# Patient Record
Sex: Female | Born: 1990 | Race: Black or African American | Hispanic: No | Marital: Single | Smoking: Light tobacco smoker
Health system: Southern US, Community
[De-identification: ages and names within clinical notes are randomized; demographics above are authoritative.]

## PROBLEM LIST (undated history)

## (undated) ENCOUNTER — Observation Stay: Admission: AD | Payer: No Typology Code available for payment source | Source: Ambulatory Visit | Admitting: Specialist

## (undated) ENCOUNTER — Observation Stay
Admission: RE | Payer: No Typology Code available for payment source | Source: Ambulatory Visit | Admitting: Obstetrics & Gynecology

## (undated) DIAGNOSIS — Z789 Other specified health status: Secondary | ICD-10-CM

## (undated) DIAGNOSIS — O24419 Gestational diabetes mellitus in pregnancy, unspecified control: Secondary | ICD-10-CM

## (undated) DIAGNOSIS — D649 Anemia, unspecified: Secondary | ICD-10-CM

## (undated) DIAGNOSIS — O1490 Unspecified pre-eclampsia, unspecified trimester: Secondary | ICD-10-CM

## (undated) DIAGNOSIS — R519 Headache, unspecified: Secondary | ICD-10-CM

## (undated) HISTORY — DX: Headache, unspecified: R51.9

## (undated) HISTORY — PX: ECTOPIC PREGNANCY SURGERY: SHX613

## (undated) HISTORY — DX: Unspecified pre-eclampsia, unspecified trimester: O14.90

---
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
## 2008-05-03 ENCOUNTER — Emergency Department: Admit: 2008-05-03 | Payer: Self-pay | Source: Emergency Department | Admitting: Emergency Medicine

## 2008-05-03 LAB — COMPREHENSIVE METABOLIC PANEL - AH CERNER
ALT: 9 U/L (ref 0–31)
AST (SGOT): 17 U/L (ref 0–31)
Albumin/Globulin Ratio: 1.7 (ref 1.1–2.2)
Albumin: 4 g/dL (ref 3.4–4.8)
Alkaline Phosphatase: 65 U/L (ref 35–104)
Anion Gap: 8 mEq/L (ref 5–15)
BUN: 11 mg/dL (ref 6–20)
Bilirubin, Total: 0.4 mg/dL (ref 0.0–1.0)
CA: 4.3 mEq/L (ref 3.8–4.6)
CO2: 25.8 mEq/L (ref 22.0–29.0)
Calcium: 9.3 mg/dL (ref 8.4–10.2)
Chloride: 107 mEq/L (ref 96–108)
Creatinine: 0.9 mg/dL (ref 0.4–1.1)
Globulin: 2.4 g/dL (ref 2.0–3.6)
Glucose: 84 mg/dL (ref 70–100)
Osmolality Calculated: 291 mosm/kg (ref 282–298)
Potassium: 3.6 mEq/L (ref 3.3–5.1)
Protein, Total: 6.4 g/dL (ref 6.4–8.3)
Sodium: 141 mEq/L (ref 133–145)
UN/CREA SOFT: 12 RATIO (ref 6–33)

## 2008-05-03 LAB — CBC AND DIFFERENTIAL
Basophils Absolute: 0 /mm3 (ref 0.0–0.2)
Basophils: 0 % (ref 0–2)
Eosinophils Absolute: 0 /mm3 (ref 0.0–0.7)
Eosinophils: 1 % (ref 0–5)
Granulocytes Absolute: 1.6 /mm3 — ABNORMAL LOW (ref 1.8–8.1)
Hematocrit: 35.5 % — ABNORMAL LOW (ref 37.0–47.0)
Hgb: 12.1 G/DL (ref 12.0–16.0)
Immature Granulocytes Absolute: 0 CUMM (ref 0.0–0.0)
Immature Granulocytes: 0 % (ref 0–1)
Lymphocytes Absolute: 2.1 /mm3 (ref 0.5–4.4)
Lymphocytes: 49 % — ABNORMAL HIGH (ref 15–41)
MCH: 30.7 PG (ref 28.0–32.0)
MCHC: 34.1 G/DL (ref 32.0–36.0)
MCV: 90.1 FL (ref 80.0–100.0)
MPV: 8.9 FL — ABNORMAL LOW (ref 9.4–12.3)
Monocytes Absolute: 0.5 /mm3 (ref 0.0–1.2)
Monocytes: 11 % (ref 0–11)
Neutrophils %: 39 % — ABNORMAL LOW (ref 52–75)
Platelets: 320 /mm3 (ref 140–400)
RBC: 3.94 /mm3 — ABNORMAL LOW (ref 4.20–5.40)
RDW: 12.1 % (ref 11.5–15.0)
WBC: 4.22 /mm3 (ref 3.50–10.80)

## 2008-05-03 LAB — CREATINE KINASE W/O REFLEX (SOFT): Creatine Kinase (CK): 112 U/L (ref 24–170)

## 2008-05-03 LAB — PT AND APTT
PT INR: 1 {INR} (ref 0.9–1.1)
PT: 12.4 s (ref 10.8–13.3)
PTT: 30 s (ref 21–32)

## 2008-05-03 LAB — HEMOLYSIS INDEX: Hemolysis Index: 19 Units

## 2008-05-03 LAB — D-DIMER (DVT) CERNER: D-Dimer (DVT): <100 ng mL (ref 0–600)

## 2008-05-03 LAB — TROPONIN I: Troponin I: 0 ng/mL — AB

## 2008-06-06 ENCOUNTER — Emergency Department: Admit: 2008-06-06 | Payer: Self-pay | Source: Emergency Department | Admitting: Emergency Medicine

## 2008-07-06 ENCOUNTER — Emergency Department: Admit: 2008-07-06 | Payer: Self-pay | Source: Emergency Department | Admitting: Emergency Medicine

## 2008-10-19 ENCOUNTER — Emergency Department: Admit: 2008-10-19 | Payer: Self-pay | Source: Emergency Department | Admitting: Emergency Medicine

## 2008-10-19 LAB — CBC AND DIFFERENTIAL
Basophils Absolute: 0 /mm3 (ref 0.0–0.2)
Basophils: 0 % (ref 0–2)
Eosinophils Absolute: 0.1 /mm3 (ref 0.0–0.7)
Eosinophils: 1 % (ref 0–5)
Granulocytes Absolute: 3.2 /mm3 (ref 1.8–8.1)
Hematocrit: 39.3 % (ref 37.0–47.0)
Hgb: 13 G/DL (ref 12.0–16.0)
Immature Granulocytes Absolute: 0 CUMM (ref 0.0–0.0)
Immature Granulocytes: 0 % (ref 0–1)
Lymphocytes Absolute: 1.7 /mm3 (ref 0.5–4.4)
Lymphocytes: 33 % (ref 15–41)
MCH: 30.5 PG (ref 28.0–32.0)
MCHC: 33.1 G/DL (ref 32.0–36.0)
MCV: 92.3 FL (ref 80.0–100.0)
MPV: 8.7 FL — ABNORMAL LOW (ref 9.4–12.3)
Monocytes Absolute: 0.3 /mm3 (ref 0.0–1.2)
Monocytes: 6 % (ref 0–11)
Neutrophils %: 60 % (ref 52–75)
Platelets: 370 /mm3 (ref 140–400)
RBC: 4.26 /mm3 (ref 4.20–5.40)
RDW: 12.5 % (ref 11.5–15.0)
WBC: 5.27 /mm3 (ref 3.50–10.80)

## 2008-10-19 LAB — COMPREHENSIVE METABOLIC PANEL - AH CERNER
ALT: 17 U/L (ref 0–31)
AST (SGOT): 19 U/L (ref 0–31)
Albumin/Globulin Ratio: 1.4 (ref 1.1–2.2)
Albumin: 4.1 g/dL (ref 3.4–4.8)
Alkaline Phosphatase: 82 U/L (ref 35–104)
Anion Gap: 6 mEq/L (ref 5–15)
BUN: 9 mg/dL (ref 6–20)
Bilirubin, Total: 0.3 mg/dL (ref 0.0–1.0)
CA: 3.9 mEq/L (ref 3.8–4.6)
CO2: 30.6 mEq/L — ABNORMAL HIGH (ref 22.0–29.0)
Calcium: 8.8 mg/dL (ref 8.4–10.2)
Chloride: 105 mEq/L (ref 96–108)
Creatinine: 0.6 mg/dL (ref 0.4–1.1)
Globulin: 2.9 g/dL (ref 2.0–3.6)
Glucose: 82 mg/dL (ref 70–100)
Osmolality Calculated: 292 mosm/kg (ref 282–298)
Potassium: 3.9 mEq/L (ref 3.3–5.1)
Protein, Total: 7 g/dL (ref 6.4–8.3)
Sodium: 142 mEq/L (ref 133–145)
UN/CREA SOFT: 15 RATIO (ref 6–33)

## 2008-10-19 LAB — URINALYSIS WITH MICROSCOPIC
Bilirubin, UA: NEGATIVE
Blood, UA: NEGATIVE
Glucose, UA: NEGATIVE
Ketones UA: NEGATIVE
Leukocyte Esterase, UA: NEGATIVE
Nitrite, UA: NEGATIVE
Protein, UR: NEGATIVE
RBC, UA: 1 /HPF (ref 0–3)
Specific Gravity UA POCT: 1.025 (ref 1.005–1.030)
Squamous Epithelial Cells, Urine: 6 /LPF — ABNORMAL HIGH (ref 0–0)
Urine pH: 6 (ref 4.6–8.0)
Urobilinogen, UA: NORMAL EU/dL
WBC, UA: 2 /HPF (ref 0–5)

## 2008-10-19 LAB — AMYLASE: Amylase: 75 U/L (ref 20–148)

## 2008-10-19 LAB — HEMOLYSIS INDEX: Hemolysis Index: 7 Units

## 2008-10-19 LAB — LIPASE: Lipase: 23 U/L (ref 13–60)

## 2009-01-07 ENCOUNTER — Emergency Department: Admit: 2009-01-07 | Payer: Self-pay | Source: Emergency Department | Admitting: Emergency Medicine

## 2009-01-07 LAB — CBC AND DIFFERENTIAL
Basophils Absolute: 0 /mm3 (ref 0.0–0.2)
Basophils: 0 % (ref 0–2)
Eosinophils Absolute: 0 /mm3 (ref 0.0–0.7)
Eosinophils: 1 % (ref 0–5)
Granulocytes Absolute: 3.7 /mm3 (ref 1.8–8.1)
Hematocrit: 35.9 % — ABNORMAL LOW (ref 37.0–47.0)
Hgb: 12.7 G/DL (ref 12.0–16.0)
Immature Granulocytes Absolute: 0 CUMM (ref 0.0–0.0)
Immature Granulocytes: 0 % (ref 0–1)
Lymphocytes Absolute: 1.5 /mm3 (ref 0.5–4.4)
Lymphocytes: 26 % (ref 15–41)
MCH: 31.4 PG (ref 28.0–32.0)
MCHC: 35.4 G/DL (ref 32.0–36.0)
MCV: 88.9 FL (ref 80.0–100.0)
MPV: 8.6 FL — ABNORMAL LOW (ref 9.4–12.3)
Monocytes Absolute: 0.6 /mm3 (ref 0.0–1.2)
Monocytes: 10 % (ref 0–11)
Neutrophils %: 63 % (ref 52–75)
Platelets: 332 /mm3 (ref 140–400)
RBC: 4.04 /mm3 — ABNORMAL LOW (ref 4.20–5.40)
RDW: 12.3 % (ref 11.5–15.0)
WBC: 5.8 /mm3 (ref 3.50–10.80)

## 2009-01-07 LAB — RH IMMUNE GLOBULIN WORKUP CERNER
AB Screen Gel: NEGATIVE
ABO Rh: O NEG

## 2009-01-07 LAB — URINALYSIS WITH MICROSCOPIC
Bilirubin, UA: NEGATIVE
Glucose, UA: NEGATIVE
Ketones UA: NEGATIVE
Nitrite, UA: NEGATIVE
Protein, UR: NEGATIVE
RBC, UA: 1 /HPF (ref 0–3)
Specific Gravity UA POCT: 1.01 (ref 1.005–1.030)
Squamous Epithelial Cells, Urine: 2 /HPF — ABNORMAL HIGH (ref 0–0)
Urine pH: 6 (ref 4.6–8.0)
Urobilinogen, UA: NORMAL mg/dL
WBC, UA: 6 /HPF — ABNORMAL HIGH (ref 0–5)

## 2009-01-07 LAB — PT AND APTT
PT INR: 1 {INR} (ref 0.9–1.1)
PT: 12.2 s (ref 10.8–13.3)
PTT: 25 s (ref 21–32)

## 2009-01-07 LAB — HCG QUANTITATIVE: hCG, Quant.: 1496 m[IU]/mL — ABNORMAL HIGH (ref 0–5)

## 2009-01-14 ENCOUNTER — Inpatient Hospital Stay
Admission: EM | Admit: 2009-01-14 | Disposition: A | Discharge status: Home / Self Care | Payer: Self-pay | Source: Emergency Department | Admitting: Obstetrics & Gynecology

## 2009-01-14 LAB — COMPREHENSIVE METABOLIC PANEL
ALT: 43 U/L (ref 0–55)
AST (SGOT): 27 U/L (ref 5–34)
Albumin/Globulin Ratio: 1.2 (ref 0.9–2.2)
Albumin: 3.4 g/dL — ABNORMAL LOW (ref 3.5–5.0)
Alkaline Phosphatase: 70 U/L (ref 40–150)
BUN: 10 mg/dL (ref 7–19)
Bilirubin, Total: 0.2 mg/dL (ref 0.2–1.2)
CO2: 24 mEq/L (ref 22–29)
Calcium: 9.1 mg/dL (ref 8.9–10.0)
Chloride: 103 mEq/L (ref 96–107)
Creatinine: 0.7 mg/dL (ref 0.6–1.0)
Globulin: 2.9 g/dL (ref 2.0–3.6)
Glucose: 73 mg/dL (ref 70–110)
Potassium: 3.7 mEq/L (ref 3.5–5.1)
Protein, Total: 6.3 g/dL (ref 6.0–8.3)
Sodium: 136 mEq/L (ref 136–145)

## 2009-01-14 LAB — CBC AND DIFFERENTIAL
Basophils Absolute: 0 /mm3 (ref 0.0–0.2)
Basophils: 0 % (ref 0–2)
Eosinophils Absolute: 0.1 /mm3 (ref 0.0–0.7)
Eosinophils: 1 % (ref 0–5)
Granulocytes Absolute: 3.2 /mm3 (ref 1.8–8.1)
Hematocrit: 33.4 % — ABNORMAL LOW (ref 37.0–47.0)
Hgb: 11.6 G/DL — ABNORMAL LOW (ref 12.0–16.0)
Immature Granulocytes Absolute: 0 CUMM (ref 0.0–0.0)
Immature Granulocytes: 0 % (ref 0–1)
Lymphocytes Absolute: 1.8 /mm3 (ref 0.5–4.4)
Lymphocytes: 33 % (ref 15–41)
MCH: 30.5 PG (ref 28.0–32.0)
MCHC: 34.7 G/DL (ref 32.0–36.0)
MCV: 87.9 FL (ref 80.0–100.0)
MPV: 8.7 FL — ABNORMAL LOW (ref 9.4–12.3)
Monocytes Absolute: 0.4 /mm3 (ref 0.0–1.2)
Monocytes: 8 % (ref 0–11)
Neutrophils %: 58 % (ref 52–75)
Platelets: 312 /mm3 (ref 140–400)
RBC: 3.8 /mm3 — ABNORMAL LOW (ref 4.20–5.40)
RDW: 12.2 % (ref 11.5–15.0)
WBC: 5.48 /mm3 (ref 3.50–10.80)

## 2009-01-14 LAB — TYPE AND SCREEN
AB Screen Gel: POSITIVE
ABO Rh: O NEG

## 2009-01-14 LAB — GFR

## 2009-01-14 LAB — ANTIBODY IDENTIFICATION - REFLEX

## 2009-01-14 LAB — HEMOLYSIS INDEX: Hemolysis Index: 4 Units

## 2009-01-14 LAB — HCG QUANTITATIVE: hCG, Quant.: 3699 m[IU]/mL — ABNORMAL HIGH (ref 0–5)

## 2009-01-15 LAB — CBC
Hematocrit: 32.7 % — ABNORMAL LOW (ref 37.0–47.0)
Hgb: 11.4 G/DL — ABNORMAL LOW (ref 12.0–16.0)
MCH: 30.8 PG (ref 28.0–32.0)
MCHC: 34.9 G/DL (ref 32.0–36.0)
MCV: 88.4 FL (ref 80.0–100.0)
MPV: 9.2 FL — ABNORMAL LOW (ref 9.4–12.3)
Platelets: 327 /mm3 (ref 140–400)
RBC: 3.7 /mm3 — ABNORMAL LOW (ref 4.20–5.40)
RDW: 12.3 % (ref 11.5–15.0)
WBC: 10.46 /mm3 (ref 3.50–10.80)

## 2009-02-18 ENCOUNTER — Emergency Department: Admit: 2009-02-18 | Payer: Self-pay | Source: Emergency Department | Admitting: Pediatric Emergency Medicine

## 2009-02-18 LAB — URINALYSIS WITH MICROSCOPIC
Bilirubin, UA: NEGATIVE
Blood, UA: NEGATIVE
Glucose, UA: NEGATIVE
Ketones UA: NEGATIVE
Nitrite, UA: NEGATIVE
Protein, UR: NEGATIVE
RBC, UA: 3 /HPF (ref 0–3)
Specific Gravity UA POCT: 1.01 (ref 1.005–1.030)
Squamous Epithelial Cells, Urine: 6 /HPF — ABNORMAL HIGH (ref 0–0)
Urine pH: 7 (ref 4.6–8.0)
Urobilinogen, UA: 2 mg/dL — ABNORMAL HIGH (ref 0.2–1.0)
WBC, UA: 5 /HPF (ref 0–5)

## 2009-03-16 ENCOUNTER — Emergency Department: Admit: 2009-03-16 | Payer: Self-pay | Source: Emergency Department | Admitting: Emergency Medicine

## 2009-03-16 LAB — CBC AND DIFFERENTIAL
Basophils Absolute: 0 /mm3 (ref 0.0–0.2)
Basophils: 0 % (ref 0–2)
Eosinophils Absolute: 0.1 /mm3 (ref 0.0–0.7)
Eosinophils: 1 % (ref 0–5)
Granulocytes Absolute: 1.9 /mm3 (ref 1.8–8.1)
Hematocrit: 34.6 % — ABNORMAL LOW (ref 37.0–47.0)
Hgb: 12.2 G/DL (ref 12.0–16.0)
Immature Granulocytes Absolute: 0 CUMM (ref 0.0–0.0)
Immature Granulocytes: 0 % (ref 0–1)
Lymphocytes Absolute: 2.6 /mm3 (ref 0.5–4.4)
Lymphocytes: 52 % — ABNORMAL HIGH (ref 15–41)
MCH: 30.8 PG (ref 28.0–32.0)
MCHC: 35.3 G/DL (ref 32.0–36.0)
MCV: 87.4 FL (ref 80.0–100.0)
MPV: 8.9 FL — ABNORMAL LOW (ref 9.4–12.3)
Monocytes Absolute: 0.4 /mm3 (ref 0.0–1.2)
Monocytes: 9 % (ref 0–11)
Neutrophils %: 38 % — ABNORMAL LOW (ref 52–75)
Platelets: 294 /mm3 (ref 140–400)
RBC: 3.96 /mm3 — ABNORMAL LOW (ref 4.20–5.40)
RDW: 11.9 % (ref 11.5–15.0)
WBC: 4.94 /mm3 (ref 3.50–10.80)

## 2009-03-16 LAB — CREATININE WHOLE BLOOD: Whole Blood Creatinine: 0.86 mg/dL (ref 0.40–1.10)

## 2009-03-16 LAB — URINALYSIS WITH MICROSCOPIC
Bilirubin, UA: NEGATIVE
Blood, UA: NEGATIVE
Glucose, UA: NEGATIVE
Ketones UA: NEGATIVE
Nitrite, UA: NEGATIVE
Protein, UR: NEGATIVE
RBC, UA: 2 /HPF (ref 0–3)
Specific Gravity UA POCT: 1.005 (ref 1.005–1.030)
Squamous Epithelial Cells, Urine: 3 /HPF — ABNORMAL HIGH (ref 0–0)
Urine pH: 8 (ref 4.6–8.0)
Urobilinogen, UA: NORMAL mg/dL
WBC, UA: 14 /HPF — ABNORMAL HIGH (ref 0–5)

## 2009-03-16 LAB — COMPREHENSIVE METABOLIC PANEL
ALT: 9 U/L (ref 0–55)
AST (SGOT): 15 U/L (ref 5–34)
Albumin/Globulin Ratio: 1.1 (ref 0.9–2.2)
Albumin: 3.5 g/dL (ref 3.5–5.0)
Alkaline Phosphatase: 74 U/L (ref 40–150)
BUN: 14 mg/dL (ref 7–19)
Bilirubin, Total: 0.1 mg/dL — AB (ref 0.2–1.2)
CO2: 25 mEq/L (ref 22–29)
Calcium: 8.9 mg/dL (ref 8.9–10.0)
Chloride: 106 mEq/L (ref 96–107)
Creatinine: 0.8 mg/dL (ref 0.6–1.0)
Globulin: 3.2 g/dL (ref 2.0–3.6)
Glucose: 96 mg/dL (ref 70–110)
Potassium: 3.9 mEq/L (ref 3.5–5.1)
Protein, Total: 6.7 g/dL (ref 6.0–8.3)
Sodium: 139 mEq/L (ref 136–145)

## 2009-03-16 LAB — GFR

## 2009-03-16 LAB — HEMOLYSIS INDEX: Hemolysis Index: 14 Units

## 2009-04-23 ENCOUNTER — Emergency Department: Admit: 2009-04-23 | Payer: Self-pay | Source: Emergency Department | Admitting: Emergency Medicine

## 2009-05-15 ENCOUNTER — Emergency Department: Admit: 2009-05-15 | Payer: Self-pay | Source: Emergency Department | Admitting: Emergency Medicine

## 2009-05-15 LAB — COMPREHENSIVE METABOLIC PANEL
ALT: 9 U/L (ref 0–55)
AST (SGOT): 14 U/L (ref 5–34)
Albumin/Globulin Ratio: 1.1 (ref 0.9–2.2)
Albumin: 3.7 g/dL (ref 3.5–5.0)
Alkaline Phosphatase: 75 U/L (ref 40–150)
BUN: 10 mg/dL (ref 7–19)
Bilirubin, Total: 0.1 mg/dL — AB (ref 0.2–1.2)
CO2: 25 mEq/L (ref 22–29)
Calcium: 9 mg/dL (ref 8.9–10.0)
Chloride: 106 mEq/L (ref 96–107)
Creatinine: 0.8 mg/dL (ref 0.6–1.0)
Globulin: 3.4 g/dL (ref 2.0–3.6)
Glucose: 99 mg/dL (ref 70–110)
Potassium: 4 mEq/L (ref 3.5–5.1)
Protein, Total: 7.1 g/dL (ref 6.0–8.3)
Sodium: 139 mEq/L (ref 136–145)

## 2009-05-15 LAB — LIPASE: Lipase: 17 U/L (ref 8–78)

## 2009-05-15 LAB — CBC AND DIFFERENTIAL
Basophils Absolute: 0 /mm3 (ref 0.0–0.2)
Basophils: 0 % (ref 0–2)
Eosinophils Absolute: 0.1 /mm3 (ref 0.0–0.7)
Eosinophils: 1 % (ref 0–5)
Granulocytes Absolute: 2.8 /mm3 (ref 1.8–8.1)
Hematocrit: 37.3 % (ref 37.0–47.0)
Hgb: 13 G/DL (ref 12.0–16.0)
Immature Granulocytes Absolute: 0 CUMM (ref 0.0–0.0)
Immature Granulocytes: 0 % (ref 0–1)
Lymphocytes Absolute: 2.7 /mm3 (ref 0.5–4.4)
Lymphocytes: 44 % — ABNORMAL HIGH (ref 15–41)
MCH: 30.4 PG (ref 28.0–32.0)
MCHC: 34.9 G/DL (ref 32.0–36.0)
MCV: 87.4 FL (ref 80.0–100.0)
MPV: 8.8 FL — ABNORMAL LOW (ref 9.4–12.3)
Monocytes Absolute: 0.5 /mm3 (ref 0.0–1.2)
Monocytes: 8 % (ref 0–11)
Neutrophils %: 47 % — ABNORMAL LOW (ref 52–75)
Platelets: 384 /mm3 (ref 140–400)
RBC: 4.27 /mm3 (ref 4.20–5.40)
RDW: 12.5 % (ref 11.5–15.0)
WBC: 6.02 /mm3 (ref 3.50–10.80)

## 2009-05-15 LAB — GFR

## 2009-05-15 LAB — HEMOLYSIS INDEX: Hemolysis Index: 8 Units

## 2009-07-11 ENCOUNTER — Emergency Department: Admit: 2009-07-11 | Payer: Self-pay | Source: Emergency Department | Admitting: Internal Medicine

## 2009-08-31 ENCOUNTER — Emergency Department: Admit: 2009-08-31 | Payer: Self-pay | Source: Emergency Department | Admitting: Emergency Medicine

## 2009-08-31 LAB — URINALYSIS WITH MICROSCOPIC
Bilirubin, UA: NEGATIVE
Blood, UA: NEGATIVE
Glucose, UA: NEGATIVE
Ketones UA: NEGATIVE
Leukocyte Esterase, UA: NEGATIVE
Nitrite, UA: NEGATIVE
Protein, UR: NEGATIVE
RBC, UA: <=1 /HPF (ref 0–?)
Specific Gravity UA POCT: 1.01 (ref 1.005–1.030)
Squamous Epithelial Cells, Urine: 3 /HPF — ABNORMAL HIGH (ref 0–0)
Urine pH: 7 (ref 4.6–8.0)
Urobilinogen, UA: NORMAL mg/dL
WBC, UA: 3 /HPF (ref 0–5)

## 2009-08-31 LAB — CBC AND DIFFERENTIAL
Basophils Absolute: 0 /mm3 (ref 0.0–0.2)
Basophils: 0 % (ref 0–2)
Eosinophils Absolute: 0.1 /mm3 (ref 0.0–0.7)
Eosinophils: 2 % (ref 0–5)
Granulocytes Absolute: 3.1 /mm3 (ref 1.8–8.1)
Hematocrit: 38.8 % (ref 37.0–47.0)
Hgb: 13.5 G/DL (ref 12.0–16.0)
Immature Granulocytes Absolute: 0 CUMM (ref 0.0–0.0)
Immature Granulocytes: 0 % (ref 0–1)
Lymphocytes Absolute: 2.2 /mm3 (ref 0.5–4.4)
Lymphocytes: 38 % (ref 15–41)
MCH: 31.5 PG (ref 28.0–32.0)
MCHC: 34.8 G/DL (ref 32.0–36.0)
MCV: 90.4 FL (ref 80.0–100.0)
MPV: 8.9 FL — ABNORMAL LOW (ref 9.4–12.3)
Monocytes Absolute: 0.4 /mm3 (ref 0.0–1.2)
Monocytes: 6 % (ref 0–11)
Neutrophils %: 53 % (ref 52–75)
Platelets: 324 /mm3 (ref 140–400)
RBC: 4.29 /mm3 (ref 4.20–5.40)
RDW: 12.4 % (ref 11.5–15.0)
WBC: 5.73 /mm3 (ref 3.50–10.80)

## 2009-08-31 LAB — BASIC METABOLIC PANEL
BUN: 10 mg/dL (ref 7–19)
CO2: 24 mEq/L (ref 22–29)
Calcium: 9.3 mg/dL (ref 8.5–10.5)
Chloride: 107 mEq/L (ref 96–107)
Creatinine: 0.8 mg/dL (ref 0.6–1.0)
Glucose: 85 mg/dL (ref 70–110)
Potassium: 4.1 mEq/L (ref 3.5–5.1)
Sodium: 141 mEq/L (ref 136–145)

## 2009-08-31 LAB — HEMOLYSIS INDEX: Hemolysis Index: 12 Units

## 2009-08-31 LAB — GFR

## 2009-08-31 LAB — HCG QUANTITATIVE: hCG, Quant.: <1.2 m[IU]/mL (ref 0–5)

## 2009-10-19 ENCOUNTER — Emergency Department: Admit: 2009-10-19 | Payer: Self-pay | Source: Emergency Department | Admitting: Emergency Medicine

## 2009-10-19 LAB — CBC AND DIFFERENTIAL
Basophils %: 0 % (ref 0–2)
Basophils Absolute: 0 10*3/uL (ref 0.00–0.20)
Eosinophils %: 3 % (ref 0–5)
Eosinophils Absolute: 0.1 10*3/uL (ref 0.00–0.70)
Granulocytes Absolute: 2.3 10*3/uL (ref 1.80–8.10)
Hematocrit: 44.4 % (ref 37.0–47.0)
Hgb: 14.9 g/dL (ref 12.0–16.0)
Immature Granulocytes Absolute: 0.02 10*3/uL — ABNORMAL HIGH
Immature Granulocytes: 0 % (ref 0–1)
Lymphocytes Absolute: 1.9 10*3/uL (ref 0.50–4.40)
Lymphocytes: 41 % (ref 15–41)
MCH: 31.1 pg (ref 28.0–32.0)
MCHC: 33.6 g/dL (ref 32.0–36.0)
MCV: 92.7 fL (ref 80.0–100.0)
MPV: 8.9 fL — ABNORMAL LOW (ref 9.4–12.3)
Monocytes Absolute: 0.4 10*3/uL (ref 0.00–1.20)
Monocytes: 8 % (ref 0–11)
Neutrophils %: 49 % — ABNORMAL LOW (ref 52–75)
Platelets: 376 10*3/uL (ref 140–400)
RBC: 4.79 10*6/uL (ref 4.20–5.40)
RDW: 12 % (ref 12–15)
WBC: 4.72 10*3/uL (ref 3.50–10.80)

## 2009-10-19 LAB — COMPREHENSIVE METABOLIC PANEL
ALT: 10 U/L (ref 0–55)
AST (SGOT): 17 U/L (ref 5–34)
Albumin/Globulin Ratio: 1.1 (ref 0.9–2.2)
Albumin: 4.3 g/dL (ref 3.5–5.0)
Alkaline Phosphatase: 84 U/L (ref 40–150)
BUN: 11 mg/dL (ref 7.0–19.0)
Bilirubin, Total: 0.3 mg/dL (ref 0.2–1.2)
CO2: 27 mEq/L (ref 22–29)
Calcium: 10.5 mg/dL (ref 8.5–10.5)
Chloride: 104 mEq/L (ref 98–107)
Creatinine: 0.9 mg/dL (ref 0.6–1.0)
Globulin: 3.8 g/dL — ABNORMAL HIGH (ref 2.0–3.6)
Glucose: 99 mg/dL (ref 70–100)
Potassium: 4.4 mEq/L (ref 3.5–5.1)
Protein, Total: 8.1 g/dL (ref 6.0–8.3)
Sodium: 140 mEq/L (ref 136–145)

## 2009-10-19 LAB — GFR: EGFR: >60

## 2009-10-19 LAB — HEMOLYSIS INDEX: Hemolysis Index: 23

## 2009-11-27 ENCOUNTER — Emergency Department: Admit: 2009-11-27 | Payer: Self-pay | Source: Emergency Department | Admitting: Emergency Medicine

## 2009-11-27 LAB — HEMOLYSIS INDEX: Hemolysis Index: 8

## 2009-11-27 LAB — CBC AND DIFFERENTIAL
Basophils Absolute: 0 10*3/uL (ref 0.00–0.20)
Basophils: 0 % (ref 0–2)
Eosinophils Absolute: 0.2 10*3/uL (ref 0.00–0.70)
Eosinophils: 4 % (ref 0–5)
Granulocytes Absolute: 3.8 10*3/uL (ref 1.80–8.10)
Hematocrit: 41.3 % (ref 37.0–47.0)
Hgb: 14.4 g/dL (ref 12.0–16.0)
Immature Granulocytes Absolute: 0 10*3/uL
Immature Granulocytes: 0 % (ref 0–1)
Lymphocytes Absolute: 0.8 10*3/uL (ref 0.50–4.40)
Lymphocytes: 16 % (ref 15–41)
MCH: 31.6 pg (ref 28.0–32.0)
MCHC: 34.9 g/dL (ref 32.0–36.0)
MCV: 90.8 fL (ref 80.0–100.0)
MPV: 8.8 fL — ABNORMAL LOW (ref 9.4–12.3)
Monocytes Absolute: 0.2 10*3/uL (ref 0.00–1.20)
Monocytes: 5 % (ref 0–11)
Neutrophils %: 75 % (ref 52–75)
Platelets: 273 10*3/uL (ref 140–400)
RBC: 4.55 10*6/uL (ref 4.20–5.40)
RDW: 12 % (ref 12–15)
WBC: 5.01 10*3/uL (ref 3.50–10.80)

## 2009-11-27 LAB — BASIC METABOLIC PANEL
BUN: 12 mg/dL (ref 7.0–19.0)
CO2: 24 mEq/L (ref 22–29)
Calcium: 9.1 mg/dL (ref 8.5–10.5)
Chloride: 105 mEq/L (ref 98–107)
Creatinine: 0.8 mg/dL (ref 0.6–1.0)
Glucose: 89 mg/dL (ref 70–100)
Potassium: 4.1 mEq/L (ref 3.5–5.1)
Sodium: 138 mEq/L (ref 136–145)

## 2009-11-27 LAB — GFR: EGFR: >60

## 2009-12-27 ENCOUNTER — Emergency Department: Admit: 2009-12-27 | Payer: Self-pay | Source: Emergency Department | Admitting: Emergency Medicine

## 2009-12-27 LAB — URINALYSIS, REFLEX TO MICROSCOPIC EXAM IF INDICATED
Bilirubin, UA: NEGATIVE
Blood, UA: NEGATIVE
Glucose, UA: NEGATIVE
Ketones UA: NEGATIVE
Nitrite, UA: NEGATIVE
Protein, UR: NEGATIVE
RBC, UA: <1 /HPF (ref 0–5)
Specific Gravity UA POCT: 1.02 (ref 1.001–1.035)
Squamous Epithelial Cells, Urine: 4 /HPF (ref 0–25)
Urine pH: 6 (ref 5.0–8.0)
Urobilinogen, UA: 2 mg/dL
WBC, UA: 1 /HPF (ref 0–5)

## 2010-02-01 ENCOUNTER — Emergency Department: Admit: 2010-02-01 | Payer: Self-pay | Source: Emergency Department | Admitting: Emergency Medicine

## 2010-02-01 LAB — COMPREHENSIVE METABOLIC PANEL
ALT: 10 U/L (ref 0–55)
AST (SGOT): 17 U/L (ref 5–34)
Albumin/Globulin Ratio: 1.2 (ref 0.9–2.2)
Albumin: 4.6 g/dL (ref 3.5–5.0)
Alkaline Phosphatase: 91 U/L (ref 40–150)
BUN: 10 mg/dL (ref 7.0–19.0)
Bilirubin, Total: 0.4 mg/dL (ref 0.2–1.2)
CO2: 24 mEq/L (ref 22–29)
Calcium: 9.7 mg/dL (ref 8.5–10.5)
Chloride: 103 mEq/L (ref 98–107)
Creatinine: 0.8 mg/dL (ref 0.6–1.0)
Globulin: 3.8 g/dL — ABNORMAL HIGH (ref 2.0–3.6)
Glucose: 90 mg/dL (ref 70–100)
Potassium: 3.9 mEq/L (ref 3.5–5.1)
Protein, Total: 8.4 g/dL — ABNORMAL HIGH (ref 6.0–8.3)
Sodium: 138 mEq/L (ref 136–145)

## 2010-02-01 LAB — CBC AND DIFFERENTIAL
Baso(Absolute): 0 10*3/uL (ref 0.00–0.20)
Basophils: 0 % (ref 0–2)
Eosinophils Absolute: 0.1 10*3/uL (ref 0.00–0.70)
Eosinophils: 2 % (ref 0–5)
Hematocrit: 45.7 % (ref 37.0–47.0)
Hgb: 16 g/dL (ref 12.0–16.0)
Immature Granulocytes Absolute: 0.02 10*3/uL
Immature Granulocytes: 0 % (ref 0–1)
Lymphocytes Absolute: 0.3 10*3/uL — ABNORMAL LOW (ref 0.50–4.40)
Lymphocytes: 6 % — ABNORMAL LOW (ref 15–41)
MCH: 31.8 pg (ref 28.0–32.0)
MCHC: 35 g/dL (ref 32.0–36.0)
MCV: 90.9 fL (ref 80.0–100.0)
MPV: 8.9 fL — ABNORMAL LOW (ref 9.4–12.3)
Monocytes Absolute: 0.2 10*3/uL (ref 0.00–1.20)
Monocytes: 4 % (ref 0–11)
Neutrophils Absolute: 4.9 10*3/uL
Neutrophils: 89 % — ABNORMAL HIGH (ref 52–75)
Platelets: 330 10*3/uL (ref 140–400)
RBC: 5.03 10*6/uL (ref 4.20–5.40)
RDW: 13 % (ref 12–15)
WBC: 5.49 10*3/uL (ref 3.50–10.80)

## 2010-02-01 LAB — URINALYSIS, REFLEX TO MICROSCOPIC EXAM IF INDICATED
Bilirubin, UA: NEGATIVE
Blood, UA: NEGATIVE
Glucose, UA: NEGATIVE
Ketones UA: NEGATIVE
Leukocyte Esterase, UA: NEGATIVE
Nitrite, UA: NEGATIVE
Protein, UR: NEGATIVE
Specific Gravity UA POCT: 1.025 (ref 1.001–1.035)
Urine pH: 5 (ref 5.0–8.0)
Urobilinogen, UA: 2 mg/dL

## 2010-02-01 LAB — HEMOLYSIS INDEX: Hemolysis Index: 16

## 2010-02-01 LAB — GFR: EGFR: >60

## 2010-02-01 LAB — LIPASE: Lipase: 11 U/L (ref 8–78)

## 2010-02-01 LAB — HCG QUANTITATIVE: hCG, Quant.: <1.2 m[IU]/mL

## 2010-05-10 ENCOUNTER — Emergency Department: Admit: 2010-05-10 | Payer: Self-pay | Source: Emergency Department | Admitting: Emergency Medicine

## 2010-08-12 ENCOUNTER — Emergency Department: Admit: 2010-08-12 | Payer: Self-pay | Source: Emergency Department | Admitting: Emergency Medicine

## 2011-01-09 ENCOUNTER — Emergency Department: Admit: 2011-01-09 | Payer: Self-pay | Source: Emergency Department | Admitting: Emergency Medicine

## 2011-01-09 LAB — CBC AND DIFFERENTIAL
Basophils Absolute Automated: 0.01 10*3/uL (ref 0.00–0.20)
Basophils Automated: 0 % (ref 0–2)
Eosinophils Absolute Automated: 0.13 10*3/uL (ref 0.00–0.70)
Eosinophils Automated: 3 % (ref 0–5)
Hematocrit: 39.8 % (ref 37.0–47.0)
Hgb: 13.5 g/dL (ref 12.0–16.0)
Immature Granulocytes Absolute: 0.01 10*3/uL
Immature Granulocytes: 0 % (ref 0–1)
Lymphocytes Absolute Automated: 1.25 10*3/uL (ref 0.50–4.40)
Lymphocytes Automated: 32 % (ref 15–41)
MCH: 32 pg (ref 28.0–32.0)
MCHC: 33.9 g/dL (ref 32.0–36.0)
MCV: 94.3 fL (ref 80.0–100.0)
MPV: 8.9 fL — ABNORMAL LOW (ref 9.4–12.3)
Monocytes Absolute Automated: 0.25 10*3/uL (ref 0.00–1.20)
Monocytes: 6 % (ref 0–11)
Neutrophils Absolute: 2.32 10*3/uL (ref 1.80–8.10)
Neutrophils: 59 % (ref 52–75)
Nucleated RBC: 1 /100 WBC
Platelets: 300 10*3/uL (ref 140–400)
RBC: 4.22 10*6/uL (ref 4.20–5.40)
RDW: 12 % (ref 12–15)
WBC: 3.96 10*3/uL (ref 3.50–10.80)

## 2011-01-09 LAB — COMPREHENSIVE METABOLIC PANEL
ALT: 14 U/L (ref 0–55)
AST (SGOT): 23 U/L (ref 5–34)
Albumin/Globulin Ratio: 1.3 (ref 0.9–2.2)
Albumin: 3.7 g/dL (ref 3.5–5.0)
Alkaline Phosphatase: 67 U/L (ref 40–150)
Anion Gap: 9 (ref 5.0–15.0)
BUN: 13 mg/dL (ref 7.0–19.0)
Bilirubin, Total: 0.4 mg/dL (ref 0.2–1.2)
CO2: 25 mEq/L (ref 22–29)
Calcium: 8.8 mg/dL (ref 8.5–10.5)
Chloride: 105 mEq/L (ref 98–107)
Creatinine: 0.8 mg/dL (ref 0.6–1.0)
Globulin: 2.9 g/dL (ref 2.0–3.6)
Glucose: 90 mg/dL (ref 70–100)
Potassium: 3.4 mEq/L — ABNORMAL LOW (ref 3.5–5.1)
Protein, Total: 6.6 g/dL (ref 6.0–8.3)
Sodium: 139 mEq/L (ref 136–145)

## 2011-01-09 LAB — HEMOLYSIS INDEX: Hemolysis Index: 18 Index (ref 0–18)

## 2011-01-09 LAB — GFR: EGFR: >60

## 2011-01-09 LAB — LIPASE: Lipase: 10 U/L (ref 8–78)

## 2011-07-03 ENCOUNTER — Emergency Department
Admit: 2011-07-03 | Disposition: A | Discharge status: Home / Self Care | Payer: Self-pay | Source: Emergency Department | Admitting: Emergency Medicine

## 2011-08-11 LAB — ECG 12-LEAD
Atrial Rate: 64 {beats}/min
P Axis: 17 degrees
P-R Interval: 136 ms
Q-T Interval: 400 ms
QRS Duration: 74 ms
QTC Calculation (Bezet): 412 ms
R Axis: 91 degrees
T Axis: 51 degrees
Ventricular Rate: 64 {beats}/min

## 2011-08-12 LAB — ECG 12-LEAD
Atrial Rate: 60 {beats}/min
P Axis: -1 degrees
P-R Interval: 120 ms
Q-T Interval: 420 ms
QRS Duration: 76 ms
QTC Calculation (Bezet): 420 ms
R Axis: 86 degrees
T Axis: 54 degrees
Ventricular Rate: 60 {beats}/min

## 2011-08-16 LAB — ECG 12-LEAD
Atrial Rate: 56 {beats}/min
P Axis: 4 degrees
P-R Interval: 126 ms
Q-T Interval: 400 ms
QRS Duration: 78 ms
QTC Calculation (Bezet): 386 ms
R Axis: 91 degrees
T Axis: 61 degrees
Ventricular Rate: 56 {beats}/min

## 2011-08-23 ENCOUNTER — Emergency Department
Admit: 2011-08-23 | Disposition: A | Discharge status: Home / Self Care | Payer: Self-pay | Source: Emergency Department | Admitting: Emergency Medicine

## 2011-08-23 LAB — URINALYSIS, REFLEX TO MICROSCOPIC EXAM IF INDICATED
Bilirubin, UA: NEGATIVE
Blood, UA: NEGATIVE
Glucose, UA: 50 — AB
Nitrite, UA: NEGATIVE
Protein, UR: 30 — AB
Specific Gravity UA POCT: 1.017 (ref 1.001–1.035)
Urine pH: 6 (ref 5.0–8.0)
Urobilinogen, UA: NEGATIVE mg/dL

## 2011-08-23 LAB — ECG 12-LEAD
Atrial Rate: 63 {beats}/min
P Axis: 8 degrees
P-R Interval: 120 ms
Q-T Interval: 386 ms
QRS Duration: 76 ms
QTC Calculation (Bezet): 395 ms
R Axis: 84 degrees
T Axis: 53 degrees
Ventricular Rate: 63 {beats}/min

## 2011-08-29 NOTE — Op Note (Signed)
Account Number: [id number]      Document ID: [id number]      Admit Date: 01/14/2009      Procedure Date: 01/15/2009            Patient Location: DISCHARGED 01/15/2009      Patient Type: I            SURGEON: Isla Pence MD      ASSISTANT:                  PREOPERATIVE DIAGNOSIS:      Left ectopic pregnancy.            POSTOPERATIVE DIAGNOSES:      Left ectopic pregnancy, ruptured.            PROCEDURES PERFORMED:      Exploratory mini laparotomy with left salpingectomy.            ASSISTANT:      Sharma Covert _____.            ANESTHESIA:      General endotracheal.            ESTIMATED BLOOD LOSS:      200 mL.            COMPLICATIONS:      None.            FINDINGS:      Massive fallopian tube was dilated to approximately 5 x 2 x 2-cm with clots      protruding from the fimbria and there was moderate amount of intraabdominal      blood amounting to approximately 150 to 200 mL.  The uterus was normal.      The right fallopian tube and ovaries were normal.            DESCRIPTION OF PROCEDURE:      The patient was taken to the operating room, given general anesthesia,      prepped and draped in the usual sterile fashion for exploratory laparotomy.       A small transverse incision made with a scalpel, was extended to the      underlying fascia by sharp dissection.  The fascia was entered      transversely.  Rectus muscle was then dissected off, separated in the      midline and peritoneum identified and entered.  Upon entering the abdomen,      hemoperitoneum was noted.  The left fallopian tube was then identified with      products of conception, which was doubly clamped with Kelly clamps,      transected and doubly ligated with 2-0 Vicryl.  The cut ends were      cauterized.  The remainder of the pelvis was examined with the above-noted      findings.  Copious suction and irrigation was carried out and the      instruments were removed.            The rectus muscles and peritoneum were reapproximated with 2-0 Monocryl.       The fascia was reapproximated with 0 Vicryl in a running fashion.  The      subcutaneous tissue was reapproximated with 2-0 Monocryl and the skin was      reapproximated in a subcuticular fashion with 4-0 Monocryl.  A sterile      dressing was placed on the incision.            SPECIMEN:  Left fallopian tube and contents were sent to pathology.            DISPOSITION:      The patient was awakened and taken to the room in stable condition without      complication.                        Electronic Signing Provider      _______________________________     Date/Time Signed: _____________      Isla Pence MD [phone])            D:  01/15/2009 19:34 PM by Dr. Daisey Must. Laurine Blazer, MD [id number])      T:  01/16/2009 10:20 AM by [id number]          Everlean Cherry: [id number]) (Doc ID: [id number])                  cc:

## 2011-09-11 LAB — ECG 12-LEAD
Atrial Rate: 60 {beats}/min
P Axis: 1 degrees
P-R Interval: 124 ms
Q-T Interval: 384 ms
QRS Duration: 76 ms
QTC Calculation (Bezet): 384 ms
R Axis: 92 degrees
T Axis: 55 degrees
Ventricular Rate: 60 {beats}/min

## 2011-09-19 ENCOUNTER — Emergency Department
Admit: 2011-09-19 | Disposition: A | Discharge status: Home / Self Care | Payer: Self-pay | Source: Emergency Department | Admitting: Pediatric Emergency Medicine

## 2011-12-11 ENCOUNTER — Emergency Department
Admit: 2011-12-11 | Discharge: 2011-12-11 | Disposition: A | Discharge status: Home / Self Care | Payer: Self-pay | Source: Emergency Department | Admitting: Emergency Medicine

## 2014-08-01 ENCOUNTER — Emergency Department
Admission: EM | Admit: 2014-08-01 | Discharge: 2014-08-01 | Disposition: A | Discharge status: Home / Self Care | Payer: Worker's Comp, Other unspecified | Attending: Emergency Medicine | Admitting: Emergency Medicine

## 2014-08-01 ENCOUNTER — Emergency Department: Payer: Worker's Comp, Other unspecified

## 2014-08-01 ENCOUNTER — Emergency Department: Payer: Self-pay

## 2014-08-01 DIAGNOSIS — S9030XA Contusion of unspecified foot, initial encounter: Secondary | ICD-10-CM | POA: Insufficient documentation

## 2014-08-01 DIAGNOSIS — F17200 Nicotine dependence, unspecified, uncomplicated: Secondary | ICD-10-CM | POA: Insufficient documentation

## 2014-08-01 DIAGNOSIS — S9031XA Contusion of right foot, initial encounter: Secondary | ICD-10-CM

## 2014-08-01 DIAGNOSIS — Y990 Civilian activity done for income or pay: Secondary | ICD-10-CM | POA: Insufficient documentation

## 2014-08-01 DIAGNOSIS — W010XXA Fall on same level from slipping, tripping and stumbling without subsequent striking against object, initial encounter: Secondary | ICD-10-CM | POA: Insufficient documentation

## 2014-08-01 MED ORDER — NAPROXEN 500 MG PO TABS
500.0000 mg | ORAL_TABLET | Freq: Two times a day (BID) | ORAL | Status: DC
Start: 2014-08-01 — End: 2014-08-09

## 2014-08-01 MED ORDER — NAPROXEN 250 MG PO TABS
500.00 mg | ORAL_TABLET | Freq: Once | ORAL | Status: AC
Start: 2014-08-01 — End: 2014-08-01
  Administered 2014-08-01: 500 mg via ORAL
  Filled 2014-08-01: qty 2

## 2014-08-01 NOTE — ED Notes (Signed)
Brandy Jennings is a 23 y.o. female, reports tripped and fall while at work, 2 days ago. Has been elevating and applying cold compress. Pain scale of 6/10.

## 2014-08-01 NOTE — Discharge Instructions (Signed)
Contusion    You have been diagnosed with a contusion.    A contusion is a bruise. A contusion occurs when something strikes or hits the body. This breaks small blood vessels called capillaries. When the capillaries break, blood leaks out. This makes the skin look red, purple, blue, or black. The injured area may hurt for a few days. If you take a blood thinner like warfarin (Coumadin) the bruising may be worse.    Apply ice to the bruise. Avoid using the injured body part.    Apply ice to help with pain and swelling. Put some ice cubes in a re-sealable plastic bag (like Ziploc). Add some water. Seal the bag. Put a thin washcloth between the bag and the skin. Apply the ice bag for at least 20 minutes. Do this at least 4 times per day. It's okay to apply ice longer or more often. NEVER APPLY ICE DIRECTLY TO THE SKIN. Always keep a washcloth between the ice pack and your body.    You have been given an ACE BANDAGE. The bandage will compress (squeeze) the injured area. This increases comfort and reduces swelling. The ACE bandage should fit snugly but not so tight as to decrease circulation (blood supply). Watch for swelling of the area outside the ACE wrap. Check capillary refill (circulation) in your fingernails or toenails. To do this, press on the nail. It should turn white. When you let go, the nail should return to pink in less than 2 seconds. If it doesn't, the bandage is too tight. Loosen the wrap if you need to.   Wear the ACE bandage as needed for comfort for the next few days.    YOU SHOULD SEEK MEDICAL ATTENTION IMMEDIATELY, EITHER HERE OR AT THE NEAREST EMERGENCY DEPARTMENT, IF ANY OF THE FOLLOWING OCCURS:   Your pain or swelling gets much worse.   You develop new numbness or tingling in or below the affected area.   Your foot or hand looks cold or pale. This could mean there is a problem with circulation (blood supply).

## 2014-08-01 NOTE — ED Provider Notes (Signed)
Physician/Midlevel provider first contact with patient: 08/01/14 1323         History     Chief Complaint   Patient presents with   . Foot Injury     HPI Comments: Right foot injury sustained 2 days ago when patient was at work.  She tripped over something on floor, catching her foot underneath the object. Is able to bear weight but with significant pain.     Patient is a 23 y.o. female presenting with foot injury. The history is provided by the patient. No language interpreter was used.   Foot Injury           History reviewed. No pertinent past medical history.    Past Surgical History   Procedure Laterality Date   . Ectopic pregnancy surgery         History reviewed. No pertinent family history.    Social  History   Substance Use Topics   . Smoking status: Current Every Day Smoker   . Smokeless tobacco: Not on file   . Alcohol Use: No       .     No Known Allergies    Discharge Medication List as of 08/01/2014  2:00 PM           Review of Systems   Constitutional: Negative.    Genitourinary:        Denies pregnancy.    Musculoskeletal:        Right foot pain.    Skin:        Skin intact.    Allergic/Immunologic: Negative.    Neurological:        No numbness or weakness distal to affected area.        Physical Exam    BP: 122/77 mmHg, Heart Rate: 100, Temp: 97.4 F (36.3 C), Resp Rate: 16, SpO2: 100 %, Weight: 68.04 kg    Physical Exam   Constitutional: She appears well-developed and well-nourished. No distress.   HENT:   Head: Normocephalic and atraumatic.   Eyes: Conjunctivae are normal. Right eye exhibits no discharge. Left eye exhibits no discharge. No scleral icterus.   Pulmonary/Chest: Effort normal. No respiratory distress.   Musculoskeletal:   Right foot:  Mild swelling, tenderness dorsal foot approx overlying 2-4 MTs.  Distal NV intact.  Ankle, lower leg completely NT.     Neurological: She is alert. Coordination normal.   Skin: Skin is warm and dry. She is not diaphoretic.   Psychiatric: She has a  normal mood and affect. Thought content normal.   Nursing note and vitals reviewed.      MDM and ED Course     ED Medication Orders     Start     Status Ordering Provider    08/01/14 1327  naproxen (NAPROSYN) tablet 500 mg   Once     Route: Oral  Ordered Dose: 500 mg     Last MAR action:  Given Nissa Stannard              MDM       Procedures    Clinical Impression & Disposition     Clinical Impression  Final diagnoses:   Contusion of foot including toes, right, initial encounter        ED Disposition     Discharge [name] M Comella discharge to home/self care.    Condition at disposition: Stable             Discharge Medication List as  of 08/01/2014  2:00 PM      START taking these medications    Details   naproxen (NAPROSYN) 500 MG tablet Take 1 tablet (500 mg total) by mouth 2 (two) times daily with meals., Starting 08/01/2014, Until Wed 08/01/15, Print                       Lars Pinks, NP  08/01/14 1625    Thea Gist, MD  08/05/14 [phone]

## 2014-08-09 ENCOUNTER — Emergency Department
Admission: EM | Admit: 2014-08-09 | Discharge: 2014-08-09 | Disposition: A | Discharge status: Home / Self Care | Payer: Self-pay | Attending: Emergency Medicine | Admitting: Emergency Medicine

## 2014-08-09 ENCOUNTER — Emergency Department: Payer: Self-pay

## 2014-08-09 DIAGNOSIS — F17200 Nicotine dependence, unspecified, uncomplicated: Secondary | ICD-10-CM | POA: Insufficient documentation

## 2014-08-09 DIAGNOSIS — R112 Nausea with vomiting, unspecified: Secondary | ICD-10-CM | POA: Insufficient documentation

## 2014-08-09 DIAGNOSIS — R1013 Epigastric pain: Secondary | ICD-10-CM | POA: Insufficient documentation

## 2014-08-09 LAB — HEMOLYSIS INDEX: Hemolysis Index: 12 (ref 0–18)

## 2014-08-09 LAB — CBC AND DIFFERENTIAL
Basophils Absolute Automated: 0.01 10*3/uL (ref 0.00–0.20)
Basophils Automated: 0 %
Eosinophils Absolute Automated: 0.1 10*3/uL (ref 0.00–0.70)
Eosinophils Automated: 2 %
Hematocrit: 40 % (ref 37.0–47.0)
Hgb: 13.6 g/dL (ref 12.0–16.0)
Immature Granulocytes Absolute: 0.01 10*3/uL
Immature Granulocytes: 0 %
Lymphocytes Absolute Automated: 0.84 10*3/uL (ref 0.50–4.40)
Lymphocytes Automated: 14 %
MCH: 31.9 pg (ref 28.0–32.0)
MCHC: 34 g/dL (ref 32.0–36.0)
MCV: 93.9 fL (ref 80.0–100.0)
MPV: 9.2 fL — ABNORMAL LOW (ref 9.4–12.3)
Monocytes Absolute Automated: 0.21 10*3/uL (ref 0.00–1.20)
Monocytes: 4 %
Neutrophils Absolute: 4.69 10*3/uL (ref 1.80–8.10)
Neutrophils: 80 %
Nucleated RBC: 0 /100 WBC (ref 0–1)
Platelets: 286 10*3/uL (ref 140–400)
RBC: 4.26 10*6/uL (ref 4.20–5.40)
RDW: 12 % (ref 12–15)
WBC: 5.85 10*3/uL (ref 3.50–10.80)

## 2014-08-09 LAB — URINALYSIS, REFLEX TO MICROSCOPIC EXAM IF INDICATED
Bilirubin, UA: NEGATIVE
Blood, UA: NEGATIVE
Glucose, UA: NEGATIVE
Ketones UA: NEGATIVE
Leukocyte Esterase, UA: NEGATIVE
Nitrite, UA: NEGATIVE
Protein, UR: 30 — AB
Specific Gravity UA: 1.02 (ref 1.001–1.035)
Urine pH: 5 (ref 5.0–8.0)
Urobilinogen, UA: NEGATIVE mg/dL

## 2014-08-09 LAB — COMPREHENSIVE METABOLIC PANEL
ALT: 11 U/L (ref 0–55)
AST (SGOT): 16 U/L (ref 5–34)
Albumin/Globulin Ratio: 1.2 (ref 0.9–2.2)
Albumin: 3.8 g/dL (ref 3.5–5.0)
Alkaline Phosphatase: 64 U/L (ref 37–106)
Anion Gap: 8 (ref 5.0–15.0)
BUN: 10 mg/dL (ref 7.0–19.0)
Bilirubin, Total: 0.5 mg/dL (ref 0.2–1.2)
CO2: 25 mEq/L (ref 22–29)
Calcium: 9.1 mg/dL (ref 8.5–10.5)
Chloride: 105 mEq/L (ref 100–111)
Creatinine: 0.9 mg/dL (ref 0.6–1.0)
Globulin: 3.3 g/dL (ref 2.0–3.6)
Glucose: 91 mg/dL (ref 70–100)
Potassium: 4.2 mEq/L (ref 3.5–5.1)
Protein, Total: 7.1 g/dL (ref 6.0–8.3)
Sodium: 138 mEq/L (ref 136–145)

## 2014-08-09 LAB — POCT PREGNANCY TEST, URINE HCG: POCT Pregnancy HCG Test, UR: NEGATIVE

## 2014-08-09 LAB — GFR: EGFR: >60

## 2014-08-09 LAB — LIPASE: Lipase: 13 U/L (ref 8–78)

## 2014-08-09 MED ORDER — ONDANSETRON HCL 4 MG/2ML IJ SOLN
4.0000 mg | Freq: Three times a day (TID) | INTRAMUSCULAR | Status: DC | PRN
Start: 2014-08-09 — End: 2014-08-09
  Administered 2014-08-09: 4 mg via INTRAVENOUS
  Filled 2014-08-09: qty 2

## 2014-08-09 MED ORDER — KETOROLAC TROMETHAMINE 30 MG/ML IJ SOLN
30.0000 mg | Freq: Once | INTRAMUSCULAR | Status: AC
Start: 2014-08-09 — End: 2014-08-09
  Administered 2014-08-09: 30 mg via INTRAVENOUS
  Filled 2014-08-09: qty 1

## 2014-08-09 MED ORDER — SODIUM CHLORIDE 0.9 % IV BOLUS
1000.0000 mL | Freq: Once | INTRAVENOUS | Status: AC
Start: 2014-08-09 — End: 2014-08-09
  Administered 2014-08-09: 1000 mL via INTRAVENOUS

## 2014-08-09 NOTE — ED Notes (Signed)
abd pain with vomiting since 0100 after eating chinese food.

## 2014-08-09 NOTE — Discharge Instructions (Signed)
Epigastric Abdominal Pain (Cause Unspecified)    You were treated for epigastric abdominal (belly) pain. We don't know the cause of the pain yet.    Epigastric pain is located in the center of your upper belly right under your ribs.     There are several common causes of epigastric abdominal pain. These may include:     Gastroesophageal reflux disease (GERD) or heartburn: This is the most common cause. It usually happens right after eating.      Gastritis: This is inflammation of your stomach.     Lactose Intolerance: This means the body can't digest lactose. This is found in milk and other dairy products.     Pancreatitis: This is when your pancreas gets inflamed. Often the pain can be felt in the back.     Ulcers in your stomach or intestine.    Your doctor diagnosed your condition by your physical exam and your history. You may have also had imaging or blood work done to make sure you don't have any serious problems.     Treatment of epigastric abdominal pain focuses on making symptoms better. Fluids and electrolytes (sodium and potassium) may be given through an IV. Pain medications may be given.     Your doctor may give you something to lower acid production or coat the stomach lining.    Acid Reducing Medications - These medications lower the amount of acid made in your stomach. This helps the inflammation in the stomach lining heal. Scheduling and dosing can vary, so follow the instructions carefully. Two of the common types of antacid medications are H2 blockers and proton pump inhibitors (PPIs). They include:    - H2 famotidine (Pepcid)  - H2 ranitidine (Zantac)  - PPI omeprazole (Prilosec)  - PPI lansoprazole (Prevacid)  - PPI esomeprazole (Nexium)  - PPI pantoprazole (Protonix)    Acid protective medications - These stick to damaged ulcer tissue and protect against acid and enzymes. This way, the ulcer can heal.     - Sucralafate (Carafate).    You have been evaluated, treated,  and observed by your doctor.Your doctor feels thatyour condition has stabilized and it is safe for you to go home.    Though we don't believe your condition is dangerous right now, it is important to be careful. Sometimes a problem that seems mild can become serious later. This is why it is very important that you return here or go to the nearest Emergency Department if you are not improving or your symptoms are getting worse.    Your doctor may have you follow up with your primary care doctor as an outpatient to check your condition. Follow up as directed.    Your doctor may prescribe you pain medications to treat yourpain. You can use over-the-counter medicines like acetaminophen (Tylenol). It is important to follow the directions for taking these medications.    Stool softeners - These medications help with the constipation caused by narcotic medications. You can use over-the-counter laxatives like milk of magnesia or magnesium citrate.    YOU SHOULD SEEK MEDICAL ATTENTION IMMEDIATELY, EITHER HERE OR AT THE NEAREST EMERGENCY DEPARTMENT, IF ANY OF THE FOLLOWING OCCUR:   You have sudden severe pain in your belly or chest.   Your pain gets worse or does not go away.   You throw up blood or see blood in your stool. Blood may be bright red or dark black and tarry.   Your skin or eyes look yellow or your   urine (pee) looks dark brown.   You get a fever (temperature higher than 100.4F or 38C) or shaking chills.     If you can't follow up with your doctor, or if at any time you feel you need to be rechecked or seen again, come back here or go to the nearest emergency department.

## 2014-08-09 NOTE — ED Provider Notes (Signed)
EMERGENCY DEPARTMENT HISTORY AND PHYSICAL EXAM     Physician/Midlevel provider first contact with patient: 08/09/14 1405         Date: 08/09/2014  Patient Name: Brandy Jennings    History of Presenting Illness     Chief Complaint   Patient presents with   . Abdominal Pain   . Emesis   . Diarrhea       History Provided By: Pt     Chief Complaint: abdominal pain   Onset: 1 am this morning   Timing: constant  Location: epigastric  Quality: aching  Severity: moderate  Modifying Factors: none   Associated Symptoms: N/V    Additional History: Brandy M Treaster is a 23 y.o. female presenting to the ED for epigastric abdominal pain since 1 am this morning, accompanied by N/V. Pt reports eating [place] food consisting of shrimp, rice, carrots, peas, and mushrooms around 9:30 pm last night. She is concerned symptoms are attributed to food poisoning. No previous similar episodes of abdominal pain. NKDA.      PCP: Pcp, Noneorunknown, MD      Current Facility-Administered Medications   Medication Dose Route Frequency Provider Last Rate Last Dose   . ondansetron (ZOFRAN) injection 4 mg  4 mg Intravenous Q8H PRN Thea Gist, MD   4 mg at 08/09/14 1512     No current outpatient prescriptions on file.       Past History     Past Medical History:  History reviewed. No pertinent past medical history.    Past Surgical History:  Past Surgical History   Procedure Laterality Date   . Ectopic pregnancy surgery         Family History:  No family history on file.    Social History:  History   Substance Use Topics   . Smoking status: Current Every Day Smoker   . Smokeless tobacco: Not on file   . Alcohol Use: No       Allergies:  No Known Allergies    Review of Systems     Review of Systems   Constitutional: Negative for fever and chills.   HENT: Negative for congestion, nosebleeds and rhinorrhea.    Eyes: Negative for photophobia and visual disturbance.   Respiratory: Negative for chest tightness, shortness of breath and wheezing.     Cardiovascular: Negative for chest pain and palpitations.   Gastrointestinal: Positive for nausea, vomiting and abdominal pain (epigastric). Negative for diarrhea.   Genitourinary: Negative for dysuria and hematuria.   Musculoskeletal: Negative for back pain, neck pain and neck stiffness.   Skin: Negative for rash and wound.   Neurological: Negative for dizziness, weakness, numbness and headaches.   Psychiatric/Behavioral: Negative for suicidal ideas and hallucinations.          Physical Exam   BP 109/65 mmHg  Pulse 73  Temp(Src) 98.3 F (36.8 C) (Oral)  Resp 16  Ht 1.626 m  Wt 68.04 kg  BMI 25.73 kg/m2  SpO2 100%  LMP 07/20/2014    Physical Exam   Constitutional: She is oriented to person, place, and time. She appears well-developed and well-nourished. No distress.   HENT:   Head: Normocephalic and atraumatic.   Eyes: Conjunctivae and EOM are normal. Pupils are equal, round, and reactive to light.   Neck: Normal range of motion. Neck supple. No JVD present. No thyromegaly present.   Cardiovascular: Normal rate, regular rhythm, normal heart sounds and intact distal pulses.  Exam reveals no gallop and no  friction rub.    No murmur heard.  Pulmonary/Chest: Effort normal and breath sounds normal. No respiratory distress. She has no wheezes.   Abdominal: Soft. She exhibits no distension and no mass. There is tenderness (Epigastric). There is no rebound and no guarding.   No RUQ or Murphy's sign.   Musculoskeletal: Normal range of motion. She exhibits no edema or tenderness.   Neurological: She is alert and oriented to person, place, and time.   Skin: Skin is warm and dry. No rash noted. She is not diaphoretic.   Psychiatric: She has a normal mood and affect. Her behavior is normal.        Diagnostic Study Results     Labs -     Results     Procedure Component Value Units Date/Time    UA, Reflex to Microscopic [[id number]]  (Abnormal) Collected:  08/09/14 1435    Specimen Information:  Urine Updated:  08/09/14  1519     Urine Type Clean Catch      Color, UA Yellow      Clarity, UA Clear      Specific Gravity UA 1.020      Urine pH 5.0      Leukocyte Esterase, UA Negative      Nitrite, UA Negative      Protein, UR 30 (A)      Glucose, UA Negative      Ketones UA Negative      Urobilinogen, UA Negative mg/dL      Bilirubin, UA Negative      Blood, UA Negative      RBC, UA 6 - 10 (A) /hpf      WBC, UA 0 - 5 /hpf      Squamous Epithelial Cells, Urine 0 - 5 /hpf      Urine Bacteria Rare /hpf     Urine HCG POC [[id number]] Collected:  08/09/14 1506     POCT QC Pass Updated:  08/09/14 1506     POCT Pregnancy HCG Test, UR Negative      Comment:        Result:        Negative Value is Normal in Healthy Males or Healthy non-pregnant Females    Comprehensive metabolic panel [[id number]] Collected:  08/09/14 1435    Specimen Information:  Blood Updated:  08/09/14 1505     Glucose 91 mg/dL      BUN 08.6 mg/dL      Creatinine 0.9 mg/dL      Sodium 578 mEq/L      Potassium 4.2 mEq/L      Chloride 105 mEq/L      CO2 25 mEq/L      CALCIUM 9.1 mg/dL      Protein, Total 7.1 g/dL      Albumin 3.8 g/dL      AST (SGOT) 16 U/L      ALT 11 U/L      Alkaline Phosphatase 64 U/L      Bilirubin, Total 0.5 mg/dL      Globulin 3.3 g/dL      Albumin/Globulin Ratio 1.2      Anion Gap 8.0     Lipase [[id number]] Collected:  08/09/14 1435    Specimen Information:  Blood Updated:  08/09/14 1505     Lipase 13 U/L     Hemolysis index [[id number]] Collected:  08/09/14 1435     Hemolysis Index 12 Updated:  08/09/14 1505    GFR [[id number]] Collected:  08/09/14  1435     EGFR >60.0 Updated:  08/09/14 1505    CBC with differential [[id number]]  (Abnormal) Collected:  08/09/14 1435    Specimen Information:  Blood / Blood Updated:  08/09/14 1447     WBC 5.85 x10 3/uL      RBC 4.26 x10 6/uL      Hgb 13.6 g/dL      Hematocrit 59.5 %      MCV 93.9 fL      MCH 31.9 pg      MCHC 34.0 g/dL      RDW 12 %      Platelets 286 x10 3/uL      MPV 9.2 (L) fL      Neutrophils 80 %       Lymphocytes Automated 14 %      Monocytes 4 %      Eosinophils Automated 2 %      Basophils Automated 0 %      Immature Granulocyte 0 %      Nucleated RBC 0 /100 WBC      Neutrophils Absolute 4.69 x10 3/uL      Abs Lymph Automated 0.84 x10 3/uL      Abs Mono Automated 0.21 x10 3/uL      Abs Eos Automated 0.10 x10 3/uL      Absolute Baso Automated 0.01 x10 3/uL      Absolute Immature Granulocyte 0.01 x10 3/uL           Radiologic Studies -   Radiology Results (24 Hour)     ** No results found for the last 24 hours. **      .      Medical Decision Making   I am the first provider for this patient.    I reviewed the vital signs, available nursing notes, past medical history, past surgical history, family history and social history.    Vital Signs-Reviewed the patient's vital signs.     Patient Vitals for the past 12 hrs:   BP Temp Pulse Resp   08/09/14 1558 109/65 mmHg - - -   08/09/14 1536 94/62 mmHg 98.3 F (36.8 C) 73 16   08/09/14 1351 120/71 mmHg 98.8 F (37.1 C) 80 16       Pulse Oximetry Analysis - Normal 99% on RA    Old Medical Records: Old medical records.  Nursing notes.     ED Course:     3:50 PM - Pt is resting comfortably. Tolerated PO. Discussed test results and tx plan with pt, who is agreeable.    3:55 PM - Repeat BP 109/65 mmHg. Pt is feeling better and would like to go home.  Discussed test results with pt and counseled on diagnosis, f/u plans, and signs and symptoms when to return to ED.  Pt is stable and ready for discharge.      Provider Notes: Is a 23 year old woman presenting to the ER for epigastric abdominal pain since 1:00 this morning.  She appears comfortable at this time.  Very mild abdominal tenderness.  This is likely secondary to food poisoning.  No right upper quadrant pain or laboratory findings to suggest cholecystitis or choledocholithiasis.    Reexamination appears comfortable.  Tolerating by mouth discharge, follow-up primary care doctor.    Core Measures:  Urine hCG POC ordered  in the ED.    Diagnosis     Clinical Impression:   1. Epigastric abdominal pain    2. Non-intractable vomiting with  nausea, vomiting of unspecified type        _______________________________    Attestations:  This note is prepared by Jana Hakim, acting as Scribe for Thea Gist, M.D.     Thea Gist, M.D.: The scribe's documentation has been prepared under my direction and personally reviewed by me in its entirety.  I confirm that the note above accurately reflects all work, treatment, procedures, and medical decision making performed by me.       _______________________________        Thea Gist, MD  08/12/14 [phone]

## 2014-11-07 ENCOUNTER — Emergency Department
Admission: EM | Admit: 2014-11-07 | Discharge: 2014-11-07 | Disposition: A | Discharge status: Home / Self Care | Payer: Self-pay | Attending: Emergency Medicine | Admitting: Emergency Medicine

## 2014-11-07 ENCOUNTER — Emergency Department: Payer: Charity

## 2014-11-07 DIAGNOSIS — R112 Nausea with vomiting, unspecified: Secondary | ICD-10-CM | POA: Insufficient documentation

## 2014-11-07 DIAGNOSIS — F17200 Nicotine dependence, unspecified, uncomplicated: Secondary | ICD-10-CM | POA: Insufficient documentation

## 2014-11-07 LAB — CBC AND DIFFERENTIAL
Basophils Absolute Automated: 0.01 10*3/uL (ref 0.00–0.20)
Basophils Automated: 0 %
Eosinophils Absolute Automated: 0.03 10*3/uL (ref 0.00–0.70)
Eosinophils Automated: 0 %
Hematocrit: 36.2 % — ABNORMAL LOW (ref 37.0–47.0)
Hgb: 12.3 g/dL (ref 12.0–16.0)
Immature Granulocytes Absolute: 0.01 10*3/uL
Immature Granulocytes: 0 %
Lymphocytes Absolute Automated: 0.8 10*3/uL (ref 0.50–4.40)
Lymphocytes Automated: 11 %
MCH: 32.1 pg — ABNORMAL HIGH (ref 28.0–32.0)
MCHC: 34 g/dL (ref 32.0–36.0)
MCV: 94.5 fL (ref 80.0–100.0)
MPV: 8.6 fL — ABNORMAL LOW (ref 9.4–12.3)
Monocytes Absolute Automated: 0.3 10*3/uL (ref 0.00–1.20)
Monocytes: 4 %
Neutrophils Absolute: 6.13 10*3/uL (ref 1.80–8.10)
Neutrophils: 84 %
Nucleated RBC: 0 /100 WBC (ref 0–1)
Platelets: 292 10*3/uL (ref 140–400)
RBC: 3.83 10*6/uL — ABNORMAL LOW (ref 4.20–5.40)
RDW: 13 % (ref 12–15)
WBC: 7.27 10*3/uL (ref 3.50–10.80)

## 2014-11-07 LAB — COMPREHENSIVE METABOLIC PANEL
ALT: 8 U/L (ref 0–55)
AST (SGOT): 14 U/L (ref 5–34)
Albumin/Globulin Ratio: 1.3 (ref 0.9–2.2)
Albumin: 3.6 g/dL (ref 3.5–5.0)
Alkaline Phosphatase: 68 U/L (ref 37–106)
Anion Gap: 5 (ref 5.0–15.0)
BUN: 9 mg/dL (ref 7.0–19.0)
Bilirubin, Total: 0.2 mg/dL (ref 0.2–1.2)
CO2: 28 mEq/L (ref 22–29)
Calcium: 8.7 mg/dL (ref 8.5–10.5)
Chloride: 105 mEq/L (ref 100–111)
Creatinine: 0.9 mg/dL (ref 0.6–1.0)
Globulin: 2.7 g/dL (ref 2.0–3.6)
Glucose: 122 mg/dL — ABNORMAL HIGH (ref 70–100)
Potassium: 3.7 mEq/L (ref 3.5–5.1)
Protein, Total: 6.3 g/dL (ref 6.0–8.3)
Sodium: 138 mEq/L (ref 136–145)

## 2014-11-07 LAB — LIPASE: Lipase: 16 U/L (ref 8–78)

## 2014-11-07 LAB — URINALYSIS, REFLEX TO MICROSCOPIC EXAM IF INDICATED
Bilirubin, UA: NEGATIVE
Glucose, UA: NEGATIVE
Ketones UA: NEGATIVE
Leukocyte Esterase, UA: NEGATIVE
Nitrite, UA: NEGATIVE
Protein, UR: 30 — AB
Specific Gravity UA: 1.017 (ref 1.001–1.035)
Urine pH: 6 (ref 5.0–8.0)
Urobilinogen, UA: NEGATIVE mg/dL

## 2014-11-07 LAB — HEMOLYSIS INDEX: Hemolysis Index: 25 — ABNORMAL HIGH (ref 0–18)

## 2014-11-07 LAB — POCT PREGNANCY TEST, URINE HCG: POCT Pregnancy HCG Test, UR: NEGATIVE

## 2014-11-07 LAB — GFR: EGFR: >60

## 2014-11-07 MED ORDER — SODIUM CHLORIDE 0.9 % IV BOLUS
1000.0000 mL | Freq: Once | INTRAVENOUS | Status: AC
Start: 2014-11-07 — End: 2014-11-07
  Administered 2014-11-07: 1000 mL via INTRAVENOUS

## 2014-11-07 MED ORDER — ONDANSETRON HCL 4 MG/2ML IJ SOLN
4.0000 mg | Freq: Once | INTRAMUSCULAR | Status: AC
Start: 2014-11-07 — End: 2014-11-07
  Administered 2014-11-07: 4 mg via INTRAVENOUS
  Filled 2014-11-07: qty 2

## 2014-11-07 MED ORDER — ONDANSETRON 4 MG PO TBDP
4.0000 mg | ORAL_TABLET | Freq: Four times a day (QID) | ORAL | Status: DC | PRN
Start: 2014-11-07 — End: 2014-12-28

## 2014-11-07 NOTE — ED Notes (Signed)
Pt woke up this morning with nausea and started vomiting about 6x. Denies abd pain/d/c. Pt stated that she feels dizzy when she stands.

## 2014-11-07 NOTE — ED Provider Notes (Signed)
EMERGENCY DEPARTMENT HISTORY AND PHYSICAL EXAM     Physician/Midlevel provider first contact with patient: 11/07/14 0827         Date: 11/07/2014  Patient Name: Brandy Jennings    History of Presenting Illness     Chief Complaint   Patient presents with   . Emesis   . Dizziness       History Provided By: Patient    Chief Complaint: Vomiting  Onset: This morning  Timing: 6 episodes  Severity: Mild-moderate  Modifying Factors: None  Associated Symptoms: Abdominal pain, nausea    Additional History: Brandy Jennings is a 23 y.o. female presenting to the ED after 6 episodes of vomiting which occurred this morning. She states that she was riding the bus to work when the vomiting began, so she got off the bus. Pt then took a cab to work, but was sent home because she continued to have vomiting. She also reports nausea and abdominal pain. She denies diarrhea, dysuria, or vaginal discharge.  Pt states that her period started yesterday, but states that it was 12 days late. She states that her period is usually regular. She is unsure about the chance of pregnancy. She states that she does not use birth control. Pt reports hx of ectopic pregnancy, for which she had surgery. Pt denies any other pregnancies. She has NKDA.     PCP: Pcp, Noneorunknown, MD      No current facility-administered medications for this encounter.     Current Outpatient Prescriptions   Medication Sig Dispense Refill   . ondansetron (ZOFRAN ODT) 4 MG disintegrating tablet Take 1 tablet (4 mg total) by mouth every 6 (six) hours as needed for Nausea. 8 tablet 0       Past History     Past Medical History:  History reviewed. No pertinent past medical history.    Past Surgical History:  Past Surgical History   Procedure Laterality Date   . Ectopic pregnancy surgery         Family History:  History reviewed. No pertinent family history.    Social History:  History   Substance Use Topics   . Smoking status: Current Every Day Smoker   . Smokeless tobacco: Not on  file   . Alcohol Use: No       Allergies:  No Known Allergies    Review of Systems     Review of Systems   Constitutional: Negative for fever.   HENT: Negative for nosebleeds.    Eyes: Negative for discharge.   Respiratory: Negative for choking.    Gastrointestinal: Positive for nausea, vomiting and abdominal pain. Negative for diarrhea.   Genitourinary: Negative for dysuria, vaginal bleeding and vaginal discharge.   Musculoskeletal: Negative for gait problem.   Skin: Negative for rash.   Allergic/Immunologic:        NKDA   Psychiatric/Behavioral: Negative for suicidal ideas.         Physical Exam   BP 106/65 mmHg  Pulse 77  Temp(Src) 96.3 F (35.7 C)  Resp 14  SpO2 100%  LMP 11/06/2014    Physical Exam   Constitutional: She is oriented to person, place, and time and well-developed, well-nourished, and in no distress.   HENT:   Head: Normocephalic and atraumatic.   Nose: Nose normal.   Mouth/Throat: Oropharynx is clear and moist.   Eyes: Conjunctivae and EOM are normal. Pupils are equal, round, and reactive to light.   Neck: Normal range of motion. Neck  supple.   Cardiovascular: Normal rate, regular rhythm, normal heart sounds and intact distal pulses.  Exam reveals no gallop and no friction rub.    No murmur heard.  Pulmonary/Chest: Effort normal and breath sounds normal. No respiratory distress. She has no wheezes. She has no rales. She exhibits no tenderness.   Abdominal: Soft. Bowel sounds are normal. She exhibits no distension and no mass. There is tenderness (mild, epigastric). There is no rebound and no guarding.   No RLQ tenderness. No RUQ tenderness   Musculoskeletal: Normal range of motion. She exhibits no edema or tenderness.   Lymphadenopathy:     She has no cervical adenopathy.   Neurological: She is alert and oriented to person, place, and time. GCS score is 15.   Skin: Skin is warm and dry. No rash noted. She is not diaphoretic. No erythema.   Psychiatric: Memory and affect normal.   Nursing  note and vitals reviewed.        Diagnostic Study Results     Labs -     Results     Procedure Component Value Units Date/Time    UA, Reflex to Microscopic [[id number]]  (Abnormal) Collected:  11/07/14 1112    Specimen Information:  Urine Updated:  11/07/14 1146     Urine Type Clean Catch      Color, UA Yellow      Clarity, UA Hazy      Specific Gravity UA 1.017      Urine pH 6.0      Leukocyte Esterase, UA Negative      Nitrite, UA Negative      Protein, UR 30 (A)      Glucose, UA Negative      Ketones UA Negative      Urobilinogen, UA Negative mg/dL      Bilirubin, UA Negative      Blood, UA Large (A)      RBC, UA TNTC (A) /hpf      WBC, UA 0 - 5 /hpf      Squamous Epithelial Cells, Urine 0 - 5 /hpf      Urine Mucus Present     Urine HCG POC [[id number]] Collected:  11/07/14 1112     POCT QC Pass Updated:  11/07/14 1115     POCT Pregnancy HCG Test, UR Negative      Comment:        Result:        Negative Value is Normal in Healthy Males or Healthy non-pregnant Females    Hemolysis index [[id number]]  (Abnormal) Collected:  11/07/14 0846     Hemolysis Index 25 (H) Updated:  11/07/14 0909    GFR [[id number]] Collected:  11/07/14 0846     EGFR >60.0 Updated:  11/07/14 0909    Comprehensive metabolic panel [[id number]]  (Abnormal) Collected:  11/07/14 0846    Specimen Information:  Blood Updated:  11/07/14 0909     Glucose 122 (H) mg/dL      BUN 9.0 mg/dL      Creatinine 0.9 mg/dL      Sodium 244 mEq/L      Potassium 3.7 mEq/L      Chloride 105 mEq/L      CO2 28 mEq/L      CALCIUM 8.7 mg/dL      Protein, Total 6.3 g/dL      Albumin 3.6 g/dL      AST (SGOT) 14 U/L      ALT 8 U/L  Alkaline Phosphatase 68 U/L      Bilirubin, Total 0.2 mg/dL      Globulin 2.7 g/dL      Albumin/Globulin Ratio 1.3      Anion Gap 5.0     Lipase [[id number]] Collected:  11/07/14 0846    Specimen Information:  Blood Updated:  11/07/14 0909     Lipase 16 U/L     CBC with differential [[id number]]  (Abnormal) Collected:  11/07/14 0846    Specimen  Information:  Blood / Blood Updated:  11/07/14 0853     WBC 7.27 x10 3/uL      Hgb 12.3 g/dL      Hematocrit 78.2 (L) %      Platelets 292 x10 3/uL      RBC 3.83 (L) x10 6/uL      MCV 94.5 fL      MCH 32.1 (H) pg      MCHC 34.0 g/dL      RDW 13 %      MPV 8.6 (L) fL      Neutrophils 84 %      Lymphocytes Automated 11 %      Monocytes 4 %      Eosinophils Automated 0 %      Basophils Automated 0 %      Immature Granulocyte 0 %      Nucleated RBC 0 /100 WBC      Neutrophils Absolute 6.13 x10 3/uL      Abs Lymph Automated 0.80 x10 3/uL      Abs Mono Automated 0.30 x10 3/uL      Abs Eos Automated 0.03 x10 3/uL      Absolute Baso Automated 0.01 x10 3/uL      Absolute Immature Granulocyte 0.01 x10 3/uL           Radiologic Studies -   Radiology Results (24 Hour)     ** No results found for the last 24 hours. **      .      Medical Decision Making   I am the first provider for this patient.    I reviewed the vital signs, available nursing notes, past medical history, past surgical history, family history and social history.    Vital Signs-Reviewed the patient's vital signs.     Patient Vitals for the past 12 hrs:   BP Temp Pulse Resp   11/07/14 1228 106/65 mmHg (!) 96.3 F (35.7 C) 77 14   11/07/14 0923 102/60 mmHg - 72 18   11/07/14 0840 120/71 mmHg (!) 96.3 F (35.7 C) 87 16       Pulse Oximetry Analysis - Normal 100% on RA    Old Medical Records: Old medical records.  Nursing notes.     ED Course:   1:16 PM - Pt is feeling better and would like to go home. Discussed results, diagnosis, plan to follow up with a primary care provider and to take zofran. Pt was counseled on early appendicitis return precautions. She agrees with plan. Pt is stable and ready for discharge.     Provider Notes:   23yo female with vomiting today. She is significantly improved with antiemetics and IVF in the ED. Exam and labs are reassuring. She does not have clinical appendicitis, but I can not completely exclude early appendicitis. I  discussed this at length with her and provided strict return precautions. [place] home. PCP follow up directed with referral list provided.     Diagnosis  Clinical Impression:   1. Non-intractable vomiting with nausea, vomiting of unspecified type        _______________________________    Attestations:  This note is prepared by Maximiano Coss, acting as Scribe for Louis Matte, MD.    Louis Matte, MD.  The scribe's documentation has been prepared under my direction and personally reviewed by me in its entirety.  I confirm that the note above accurately reflects all work, treatment, procedures, and medical decision making performed by me.    _______________________________          Louis Matte, MD  11/08/14 [phone]

## 2014-11-07 NOTE — Discharge Instructions (Signed)
Vomiting    You have been seen for vomiting.    Vomiting (throwing-up) can be caused by many different things. Most of the time the cause IS NOT serious. The doctor feels it is OK for you to go home today.    Common causes of vomiting include the following:   Gastroenteritis (stomach flu), usually with diarrhea.   Other illnesses. Sometimes medical conditions like diabetes, heart problems, headaches, or infections can make someone throw up.    Bowel obstructions (blockages) can cause vomiting and make patients unable to have bowel movements (stool) or pass gas.   Vomiting can be a symptom of appendicitis, especially if there is also pain in the right lower abdomen (belly).    Sometimes it is hard to find out what is causing the vomiting. Vomiting can be treated with anti-nausea medicines like promethazine (Phenergan), prochlorperazine (Compazine) or ondansetron (Zofran).    Try to drink liquids to avoid dehydration. Don't drink a lot of fluid all at once. Take small sips throughout the day.    YOU SHOULD SEEK MEDICAL ATTENTION IMMEDIATELY, EITHER HERE OR AT THE NEAREST EMERGENCY DEPARTMENT, IF ANY OF THE FOLLOWING OCCURS:   You can't stop vomiting or your vomiting doesn't get better with medication.   You cannot keep liquids down.   You have severe sudden chest or belly pain after vomiting.   You have abdominal pain.          Abdominal Pain, Appendicitis     You have been seen for Abdominal Pain.    The doctor that saw you today was worried that your abdominal pain could be from appendicitis. Appendicitis is when a small part of your bowel gets inflamed and infected. It can happen at any age, but often happens to people 10-30 years old. People with appendicitis usually need surgery to get the appendix out. Not getting a surgery can be dangerous.    The tests you had today were not able to tell us if you have appendicitis or not. Appendicitis develops along a spectrum. This means that appendicitis  may look like a minor problem at first but then it progresses and gets worse over time. You will need another exam within 12-24 hours to see how your symptoms change over time. At this time you do not need to stay in the hospital.    We don't believe your condition is dangerous right now. However, you need to be careful. Symptoms of appendicitis can progress. If you have these symptoms, come back sooner before your scheduled reexamination.     Abdominal pain that centers in the right lower part of the belly.   Loss of appetite.   Throwing up a lot.   You have a fever (temperature higher than 100.4F or 38C) or chills.   Abdominal Pain that gets worse with coughing or any sudden movement.    Follow the instructions for any medication you get prescribed.     It is VERY IMPORTANT to come back here (or to your doctor if instructed) to get your belly re-checked. The doctor should tell you if he/she wants you to return in 12 or 24 hours.    YOU SHOULD SEEK MEDICAL ATTENTION IMMEDIATELY, EITHER HERE OR AT THE NEAREST EMERGENCY DEPARTMENT, IF ANY OF THE FOLLOWING OCCURS:     You have belly pain that centers in the right lower part of the belly.   Your pain does not go away or gets worse.   You have a loss of   appetite.   Throwing up a lot where you cannot keep fluids down or your vomit is dark green.    Any other symptoms, concerns, or not getting better as expected.   You get worse or feel you can t wait until your follow-up appointment to get treated.    If you can t follow up with your doctor, or if at any time you feel you need to be rechecked or seen again, come back here or go to the nearest emergency department.

## 2014-11-07 NOTE — ED Notes (Signed)
Declined Rapid HIV Testing 11/07/2014

## 2014-12-28 ENCOUNTER — Emergency Department: Payer: Self-pay

## 2014-12-28 ENCOUNTER — Emergency Department
Admission: EM | Admit: 2014-12-28 | Discharge: 2014-12-28 | Disposition: A | Discharge status: Home / Self Care | Payer: Self-pay | Attending: Emergency Medicine | Admitting: Emergency Medicine

## 2014-12-28 DIAGNOSIS — R1033 Periumbilical pain: Secondary | ICD-10-CM | POA: Insufficient documentation

## 2014-12-28 DIAGNOSIS — R197 Diarrhea, unspecified: Secondary | ICD-10-CM | POA: Insufficient documentation

## 2014-12-28 DIAGNOSIS — R112 Nausea with vomiting, unspecified: Secondary | ICD-10-CM | POA: Insufficient documentation

## 2014-12-28 DIAGNOSIS — F17200 Nicotine dependence, unspecified, uncomplicated: Secondary | ICD-10-CM | POA: Insufficient documentation

## 2014-12-28 LAB — GFR: EGFR: >60

## 2014-12-28 LAB — URINALYSIS, REFLEX TO MICROSCOPIC EXAM IF INDICATED
Bilirubin, UA: NEGATIVE
Blood, UA: NEGATIVE
Glucose, UA: NEGATIVE
Leukocyte Esterase, UA: NEGATIVE
Nitrite, UA: NEGATIVE
Protein, UR: 30 — AB
Specific Gravity UA: 1.023 (ref 1.001–1.035)
Urine pH: 6 (ref 5.0–8.0)
Urobilinogen, UA: NEGATIVE mg/dL

## 2014-12-28 LAB — COMPREHENSIVE METABOLIC PANEL
ALT: 11 U/L (ref 0–55)
AST (SGOT): 17 U/L (ref 5–34)
Albumin/Globulin Ratio: 1.2 (ref 0.9–2.2)
Albumin: 3.9 g/dL (ref 3.5–5.0)
Alkaline Phosphatase: 76 U/L (ref 37–106)
Anion Gap: 8 (ref 5.0–15.0)
BUN: 8 mg/dL (ref 7.0–19.0)
Bilirubin, Total: 0.4 mg/dL (ref 0.2–1.2)
CO2: 24 mEq/L (ref 22–29)
Calcium: 9.4 mg/dL (ref 8.5–10.5)
Chloride: 108 mEq/L (ref 100–111)
Creatinine: 0.8 mg/dL (ref 0.6–1.0)
Globulin: 3.2 g/dL (ref 2.0–3.6)
Glucose: 79 mg/dL (ref 70–100)
Potassium: 4.1 mEq/L (ref 3.5–5.1)
Protein, Total: 7.1 g/dL (ref 6.0–8.3)
Sodium: 140 mEq/L (ref 136–145)

## 2014-12-28 LAB — CBC AND DIFFERENTIAL
Basophils Absolute Automated: 0.01 10*3/uL (ref 0.00–0.20)
Basophils Automated: 0 %
Eosinophils Absolute Automated: 0.06 10*3/uL (ref 0.00–0.70)
Eosinophils Automated: 1 %
Hematocrit: 40.4 % (ref 37.0–47.0)
Hgb: 13.8 g/dL (ref 12.0–16.0)
Immature Granulocytes Absolute: 0.02 10*3/uL
Immature Granulocytes: 0 %
Lymphocytes Absolute Automated: 1.05 10*3/uL (ref 0.50–4.40)
Lymphocytes Automated: 14 %
MCH: 32.2 pg — ABNORMAL HIGH (ref 28.0–32.0)
MCHC: 34.2 g/dL (ref 32.0–36.0)
MCV: 94.2 fL (ref 80.0–100.0)
MPV: 8.9 fL — ABNORMAL LOW (ref 9.4–12.3)
Monocytes Absolute Automated: 0.35 10*3/uL (ref 0.00–1.20)
Monocytes: 4 %
Neutrophils Absolute: 6.28 10*3/uL (ref 1.80–8.10)
Neutrophils: 81 %
Nucleated RBC: 0 /100 WBC (ref 0–1)
Platelets: 316 10*3/uL (ref 140–400)
RBC: 4.29 10*6/uL (ref 4.20–5.40)
RDW: 13 % (ref 12–15)
WBC: 7.75 10*3/uL (ref 3.50–10.80)

## 2014-12-28 LAB — POCT PREGNANCY TEST, URINE HCG: POCT Pregnancy HCG Test, UR: NEGATIVE

## 2014-12-28 LAB — HEMOLYSIS INDEX: Hemolysis Index: 22 — ABNORMAL HIGH (ref 0–18)

## 2014-12-28 LAB — LIPASE: Lipase: 8 U/L (ref 8–78)

## 2014-12-28 MED ORDER — SODIUM CHLORIDE 0.9 % IV BOLUS
1000.0000 mL | Freq: Once | INTRAVENOUS | Status: AC
Start: 2014-12-28 — End: 2014-12-28
  Administered 2014-12-28: 1000 mL via INTRAVENOUS

## 2014-12-28 MED ORDER — ONDANSETRON 4 MG PO TBDP
4.0000 mg | ORAL_TABLET | Freq: Four times a day (QID) | ORAL | Status: DC | PRN
Start: 2014-12-28 — End: 2015-05-14

## 2014-12-28 MED ORDER — ONDANSETRON HCL 4 MG/2ML IJ SOLN
4.0000 mg | Freq: Once | INTRAMUSCULAR | Status: AC
Start: 2014-12-28 — End: 2014-12-28
  Administered 2014-12-28: 4 mg via INTRAVENOUS
  Filled 2014-12-28: qty 2

## 2014-12-28 NOTE — ED Notes (Signed)
24 year old female here today for N/V/D that started around 730 am.

## 2014-12-28 NOTE — ED Notes (Signed)
Declined Rapid HIV Testing 12/28/2014

## 2014-12-28 NOTE — ED Provider Notes (Signed)
EMERGENCY DEPARTMENT HISTORY AND PHYSICAL EXAM     Physician/Midlevel provider first contact with patient: 12/28/14 1619         Date: 12/28/2014  Patient Name: Brandy Jennings    History of Presenting Illness     Chief Complaint   Patient presents with   . Nausea   . Diarrhea     History Provided By: Patient    Chief Complaint: Abd pain  Onset: This morning  Timing: Intermittent  Location: Periumbilical  Quality: Pressure  Severity: Moderate  Modifying Factors: Worse with movement  Associated Symptoms: N/V/D    Additional History: Brandy M Outen is a 24 y.o. female who C/O moderate intermittent periumbilical pain x this morning that feels like pressure. Associated symptoms include nausea, 4-5 episodes of vomiting (with no blood), and diarrhea that is green and watery. The abd pain gets worse with movement. Pt states that she went to bed feeling fine last night and woke up this morning not feeling well. She notes that she did not have anything to eat last night. She denies having dysuria, fever, CP, or a cough. Denies sick contacts. No recent travel    PCP: Pcp, Noneorunknown, MD      No current facility-administered medications for this encounter.     Current Outpatient Prescriptions   Medication Sig Dispense Refill   . ondansetron (ZOFRAN ODT) 4 MG disintegrating tablet Take 1 tablet (4 mg total) by mouth every 6 (six) hours as needed for Nausea (vomiting). 8 tablet 0       Past History     Past Medical History:  History reviewed. No pertinent past medical history.    Past Surgical History:  Past Surgical History   Procedure Laterality Date   . Ectopic pregnancy surgery         Family History:  History reviewed. No pertinent family history.    Social History:  History   Substance Use Topics   . Smoking status: Current Every Day Smoker   . Smokeless tobacco: Not on file   . Alcohol Use: No       Allergies:  No Known Allergies    Review of Systems     Review of Systems   Constitutional: Negative for fever and  diaphoresis.   HENT: Negative for drooling, hearing loss and trouble swallowing.    Eyes: Negative for discharge and redness.   Respiratory: Negative for cough, shortness of breath and wheezing.    Cardiovascular: Negative for chest pain and leg swelling.   Gastrointestinal: Positive for nausea, vomiting, abdominal pain and diarrhea.   Genitourinary: Negative for dysuria, hematuria and difficulty urinating.   Musculoskeletal: Negative for back pain and neck pain.   Skin: Negative for rash.   Allergic/Immunologic:        NKDA   Neurological: Negative for facial asymmetry and speech difficulty.   Psychiatric/Behavioral: Negative for suicidal ideas and confusion.          Physical Exam   BP 104/53 mmHg  Pulse 71  Temp(Src) 98.4 F (36.9 C) (Oral)  Resp 18  Ht 5\' 4"  (1.626 m)  Wt 68.04 kg  BMI 25.73 kg/m2  SpO2 99%  LMP 12/04/2014    Physical Exam   Constitutional: She is oriented to person, place, and time. She appears well-developed and well-nourished. No distress.   HENT:   Head: Normocephalic and atraumatic.   Mouth/Throat: Oropharynx is clear and moist.   Eyes: Conjunctivae are normal. Right eye exhibits no discharge. Left eye  exhibits no discharge. No scleral icterus.   Neck: Neck supple. No JVD present. No tracheal deviation present.   Cardiovascular: Normal rate, regular rhythm, normal heart sounds and intact distal pulses.    No murmur heard.  2+ L radial pulse   Pulmonary/Chest: Breath sounds normal. No stridor. No respiratory distress. She has no wheezes.   Abdominal: Soft. She exhibits no distension and no mass. There is no tenderness. There is no rebound and no guarding.       abd not peritoneal   Musculoskeletal: Normal range of motion. She exhibits no edema or tenderness.   Neurological: She is alert and oriented to person, place, and time. She exhibits normal muscle tone. Coordination normal.   Skin: Skin is warm and dry. She is not diaphoretic. No pallor.   Psychiatric: She has a normal mood  and affect. Her behavior is normal.   Nursing note and vitals reviewed.        Diagnostic Study Results     Labs -     Results     Procedure Component Value Units Date/Time    UA, Reflex to Microscopic [[id number]]  (Abnormal) Collected:  12/28/14 1606    Specimen Information:  Urine Updated:  12/28/14 1634     Urine Type Clean Catch      Color, UA Yellow      Clarity, UA Clear      Specific Gravity UA 1.023      Urine pH 6.0      Leukocyte Esterase, UA Negative      Nitrite, UA Negative      Protein, UR 30 (A)      Glucose, UA Negative      Ketones UA Trace (A)      Urobilinogen, UA Negative mg/dL      Bilirubin, UA Negative      Blood, UA Negative      RBC, UA 0 - 5 /hpf      WBC, UA 0 - 5 /hpf      Squamous Epithelial Cells, Urine 0 - 5 /hpf     Comprehensive metabolic panel [[id number]] Collected:  12/28/14 1606    Specimen Information:  Blood Updated:  12/28/14 1631     Glucose 79 mg/dL      BUN 8.0 mg/dL      Creatinine 0.8 mg/dL      Sodium 782 mEq/L      Potassium 4.1 mEq/L      Chloride 108 mEq/L      CO2 24 mEq/L      CALCIUM 9.4 mg/dL      Protein, Total 7.1 g/dL      Albumin 3.9 g/dL      AST (SGOT) 17 U/L      ALT 11 U/L      Alkaline Phosphatase 76 U/L      Bilirubin, Total 0.4 mg/dL      Globulin 3.2 g/dL      Albumin/Globulin Ratio 1.2      Anion Gap 8.0     Lipase [[id number]] Collected:  12/28/14 1606    Specimen Information:  Blood Updated:  12/28/14 1631     Lipase 8 U/L     Hemolysis index [[id number]]  (Abnormal) Collected:  12/28/14 1606     Hemolysis Index 22 (H) Updated:  12/28/14 1631    GFR [[id number]] Collected:  12/28/14 1606     EGFR >60.0 Updated:  12/28/14 1631    CBC with differential [[id number]]  (Abnormal)  Collected:  12/28/14 1606    Specimen Information:  Blood / Blood Updated:  12/28/14 1617     WBC 7.75 x10 3/uL      Hgb 13.8 g/dL      Hematocrit 16.1 %      Platelets 316 x10 3/uL      RBC 4.29 x10 6/uL      MCV 94.2 fL      MCH 32.2 (H) pg      MCHC 34.2 g/dL      RDW 13 %       MPV 8.9 (L) fL      Neutrophils 81 %      Lymphocytes Automated 14 %      Monocytes 4 %      Eosinophils Automated 1 %      Basophils Automated 0 %      Immature Granulocyte 0 %      Nucleated RBC 0 /100 WBC      Neutrophils Absolute 6.28 x10 3/uL      Abs Lymph Automated 1.05 x10 3/uL      Abs Mono Automated 0.35 x10 3/uL      Abs Eos Automated 0.06 x10 3/uL      Absolute Baso Automated 0.01 x10 3/uL      Absolute Immature Granulocyte 0.02 x10 3/uL     Urine HCG - POCT [[id number]] Collected:  12/28/14 1609    Specimen Information:  Urine Updated:  12/28/14 1609     POCT QC Pass      POCT Pregnancy HCG Test, UR Negative      Comment:        Result:        Negative Value is Normal in Healthy Males or Healthy non-pregnant Females          Radiologic Studies -   Radiology Results (24 Hour)     ** No results found for the last 24 hours. **      .      Medical Decision Making   I am the first provider for this patient.    I reviewed the vital signs, available nursing notes, past medical history, past surgical history, family history and social history.    Vital Signs-Reviewed the patient's vital signs.     Patient Vitals for the past 12 hrs:   BP Temp Pulse Resp   12/28/14 1739 104/53 mmHg 98.4 F (36.9 C) 71 18   12/28/14 1528 (!) 123/92 mmHg 98.4 F (36.9 C) 87 20       Pulse Oximetry Analysis - Normal 98% on RA.    Old Medical Records: Nursing notes.     ED Course: 5:37 PM - Pt tolerated PO, feels better, no pain.    6:55 PM  Pt is feeling better and would like to go home.  Discussed test results with pt and counseled on diagnosis, f/u plans with her PCP or low cost clinic, medication use, and signs and symptoms when to return to ED.  Pt is stable and ready for discharge. Requesting work note     Provider Notes: V first, tol some PO,  Intermittent abd pain worse w/ movement & on bus, & then diarrhea. No sick contacts. Rx Sx, reassess. Pt is nontoxic    Procedures:    Core Measures:    Critical Care Time:      Diagnosis     Clinical Impression:   1. Nausea vomiting and diarrhea    2. Periumbilical abdominal  pain        _______________________________    This note is prepared by Rosine Abe acting as Scribe for Carles Collet, MD.    Carles Collet, MD.  The scribe's documentation has been prepared under my direction and personally reviewed by me in its entirety.  I confirm that the note above accurately reflects all work, treatment, procedures, and medical decision making performed by me.        _______________________________          Annett Fabian, MD  12/29/14 2041

## 2014-12-28 NOTE — Discharge Instructions (Signed)
Take Tylenol as needed for pain.    Clear Liquid Diet    Your doctor wants you to follow a clear liquid diet.     A clear liquid diet includes clear liquids like water or broth. This diet prevents build-up of residue (solid food particles) in your stomach or intestines. Your doctor may recommend a clear liquid diet before a test, procedure or surgery when your digestive tract should be clear of leftover food. It may also be recommended for a few days if you are having nausea and/or vomiting.     Clear liquid diet may include the following:   Water.   Juices without pulp (cranberry juice, apple juice, etc).   Clear broth (such as chicken or beef).   Gelatin without pieces of fruit or whipped cream.   Clear soda pop.   Frozen freezer pops that do not have pieces of fruit.    Tea or coffee without milk or creamer.     If this diet is recommended before a test like a colonoscopy, you may be asked to avoid liquids with certain colors (like red or purple). This is because they can interfere with the test.     Avoid foods not listed above. Eating foods other than those listed may interfere with your test or surgery and may require it to be rescheduled.     A clear liquid diet gives you some calories and prevents dehydration when preparing for a test or recovering from an illness of the stomach. However, it does not provide enough nutrition for a healthy lifestyle. It should not be continued for more than a few days. Follow the diet as directed by your doctor.

## 2015-05-14 ENCOUNTER — Emergency Department: Payer: Self-pay

## 2015-05-14 ENCOUNTER — Emergency Department
Admission: EM | Admit: 2015-05-14 | Discharge: 2015-05-14 | Disposition: A | Discharge status: Home / Self Care | Payer: Self-pay | Attending: Emergency Medicine | Admitting: Emergency Medicine

## 2015-05-14 DIAGNOSIS — L7682 Other postprocedural complications of skin and subcutaneous tissue: Secondary | ICD-10-CM

## 2015-05-14 DIAGNOSIS — R102 Pelvic and perineal pain: Secondary | ICD-10-CM | POA: Insufficient documentation

## 2015-05-14 DIAGNOSIS — Z9889 Other specified postprocedural states: Secondary | ICD-10-CM | POA: Insufficient documentation

## 2015-05-14 LAB — URINALYSIS POC
Blood, UA POCT: NEGATIVE
POCT Urine Bilirubin: NEGATIVE
POCT Urine Glucose: NEGATIVE mg/dL
POCT Urine Ketones: NEGATIVE mg/dL
POCT Urine Nitrites: NEGATIVE
POCT Urine Urobilibogen: 1 mg/dL (ref 0.0–1.0)
POCT Urine pH: 6 (ref 5.0–8.0)
Protein, UR POCT: NEGATIVE mg/dL
Urine Specific Gravity POC: 1.02 (ref 1.001–1.035)

## 2015-05-14 LAB — URINE BHCG POC: Urine bHCG POC: NEGATIVE

## 2015-05-14 MED ORDER — HYDROCODONE-ACETAMINOPHEN 5-325 MG PO TABS
1.0000 | ORAL_TABLET | Freq: Once | ORAL | Status: AC
Start: 2015-05-14 — End: 2015-05-14
  Administered 2015-05-14: 1 via ORAL
  Filled 2015-05-14: qty 1

## 2015-05-14 MED ORDER — NAPROXEN 500 MG PO TABS
500.0000 mg | ORAL_TABLET | Freq: Two times a day (BID) | ORAL | Status: DC | PRN
Start: 2015-05-14 — End: 2016-11-24

## 2015-05-14 MED ORDER — HYDROCODONE-ACETAMINOPHEN 5-325 MG PO TABS
1.0000 | ORAL_TABLET | Freq: Four times a day (QID) | ORAL | Status: AC | PRN
Start: 2015-05-14 — End: 2015-05-24

## 2015-05-14 MED ORDER — LORAZEPAM 1 MG PO TABS
1.0000 mg | ORAL_TABLET | Freq: Once | ORAL | Status: AC
Start: 2015-05-14 — End: 2015-05-14
  Administered 2015-05-14: 1 mg via ORAL
  Filled 2015-05-14: qty 1

## 2015-05-14 MED ORDER — NAPROXEN 250 MG PO TABS
500.0000 mg | ORAL_TABLET | Freq: Once | ORAL | Status: AC
Start: 2015-05-14 — End: 2015-05-14
  Administered 2015-05-14: 500 mg via ORAL
  Filled 2015-05-14: qty 2

## 2015-05-14 NOTE — ED Provider Notes (Signed)
EMERGENCY DEPARTMENT HISTORY AND PHYSICAL EXAM     Physician/Midlevel provider first contact with patient: 05/14/15 1907         Date: 05/14/2015  Patient Name: Brandy Jennings    History of Presenting Illness     Chief Complaint   Patient presents with   . Post-op Problem       History Provided By: Patient    Chief Complaint: abdominal pain  Onset: 1 week  Timing: constant  Location: lower  Quality: sharp  Severity: moderate  Modifying Factors: worse w/ ambulation, menses  Associated Symptoms: none    Additional History: Brandy Jennings is a 24 y.o. female presenting to the ED with acute on chronic lower abdominal pain at her incision for the past week . Her symptoms are constant and described as sharp. She typically experiences this pain with her menstrual cycle, however she is not currently on her period. She has had these symptoms intermittently since 2009 when she had surgery performed for an ectopic pregnancy. Her pain is worsened with her menses and ambulation. Pain has worsened this week. She does not have a PMD or GYN (her insurance kicks in this week). She is taking Ibuprofen and Tramadol without pain relief. Patient denies nausea, vomiting, diarrhea, abd trauma, recent intercourse, fever, chest pain, or cough.     PCP: Pcp, Noneorunknown, MD      Current Facility-Administered Medications   Medication Dose Route Frequency Provider Last Rate Last Dose   . HYDROcodone-acetaminophen (NORCO) 5-325 MG per tablet 1 tablet  1 tablet Oral Once Annett Fabian, MD         Current Outpatient Prescriptions   Medication Sig Dispense Refill   . HYDROcodone-acetaminophen (NORCO) 5-325 MG per tablet Take 1-2 tablets by mouth every 6 (six) hours as needed for Pain (severe). 15 tablet 0   . naproxen (NAPROSYN) 500 MG tablet Take 1 tablet (500 mg total) by mouth every 12 (twelve) hours as needed (pain, take w/ meals). 30 tablet 0       Past History     Past Medical History:  History reviewed. No pertinent past medical  history.    Past Surgical History:  Past Surgical History   Procedure Laterality Date   . Ectopic pregnancy surgery         Family History:  History reviewed. No pertinent family history.    Social History:  History   Substance Use Topics   . Smoking status: Current Every Day Smoker   . Smokeless tobacco: Not on file   . Alcohol Use: No       Allergies:  No Known Allergies    Review of Systems     Review of Systems   Constitutional: Negative for fever and diaphoresis.   HENT: Negative for drooling, hearing loss and trouble swallowing.    Eyes: Negative for discharge and redness.   Respiratory: Negative for cough and wheezing.    Cardiovascular: Negative for chest pain and leg swelling.   Gastrointestinal: Positive for abdominal pain. Negative for nausea, vomiting and diarrhea.   Genitourinary: Positive for pelvic pain. Negative for dysuria, vaginal bleeding and vaginal discharge.   Musculoskeletal: Negative for back pain and gait problem.   Skin: Negative for rash.   Neurological: Negative for facial asymmetry and speech difficulty.   Psychiatric/Behavioral: Negative for suicidal ideas and confusion.         Physical Exam   BP 113/60 mmHg  Pulse 78  Temp(Src) 97.3 F (36.3 C) (  Oral)  Resp 16  Ht 5\' 4"  (1.626 m)  Wt 62.596 kg  BMI 23.68 kg/m2  SpO2 99%    Physical Exam   Constitutional: She is oriented to person, place, and time. She appears well-developed and well-nourished. She appears distressed (mild).   HENT:   Head: Normocephalic and atraumatic.   Mouth/Throat: Oropharynx is clear and moist.   Eyes: Conjunctivae are normal. Right eye exhibits no discharge. Left eye exhibits no discharge. No scleral icterus.   Neck: Neck supple. No JVD present. No tracheal deviation present.   Cardiovascular: Normal rate, regular rhythm, normal heart sounds and intact distal pulses.    No murmur heard.  2+ L radial pulse     Pulmonary/Chest: No stridor. She is in respiratory distress. She has no wheezes.   Abdominal:  Soft. She exhibits no distension and no mass. There is tenderness. There is no rebound and no guarding.       Genitourinary: Uterus is tender. Cervix exhibits discharge. Cervix exhibits no motion tenderness. Right adnexum displays tenderness. Left adnexum displays tenderness. Vaginal discharge (scant yellow) found.   Musculoskeletal: Normal range of motion. She exhibits no edema or tenderness.   Neurological: She is alert and oriented to person, place, and time. She exhibits normal muscle tone. Coordination normal.   Skin: Skin is warm and dry. She is not diaphoretic. No pallor.   Psychiatric: She has a normal mood and affect. Her behavior is normal.   Nursing note and vitals reviewed.        Diagnostic Study Results     Labs -     Results     Procedure Component Value Units Date/Time    Wet prep trichomonas [[id number]] Collected:  05/14/15 2014    Specimen Information:  Cervical Swab Updated:  05/14/15 2027    Narrative:      ORDER#: [id number]                                    ORDERED BY: Bonne Whack  SOURCE: Cervical Swab                                COLLECTED:  05/14/15 20:14  ANTIBIOTICS AT COLL.:                                RECEIVED :  05/14/15 20:20  Wet Prep Trichomonas                       FINAL       05/14/15 20:27  05/14/15   No Trichomonas or Yeast Seen             Reference Range: No Trichomonas or Yeast Seen      Chlamydia/GC by PCR [[id number]] Collected:  05/14/15 2014    Specimen Information:  Endocervix Swab Updated:  05/14/15 2014    Narrative:      Call Lab first    Urine BHCG POC [[id number]] Collected:  05/14/15 1934     Urine bHCG POC Negative Updated:  05/14/15 1949    Urinalysis POC [[id number]]  (Abnormal) Collected:  05/14/15 1932     POCT Urine Color Amber (A) Updated:  05/14/15 1942     POCT Urine Clarity Clear      POCT  Urine pH 6.0      Urine leukocyte Esterase, POCT Trace (A)      POCT Urine Nitrites Negative      Protein, UR POCT Negative mg/dL      POCT Urine Glucose Negative mg/dL       POCT Urine Ketones Negative mg/dL      POCT Urine Urobilibogen 1.0 mg/dL      POCT Urine Bilirubin Negative      Blood, UA POCT Negative      Urine Specific Gravity POC 1.020           Radiologic Studies -   Radiology Results (24 Hour)     Procedure Component Value Units Date/Time    US Transvaginal Non OB with LTD Doppler (Ovarian Torsion) [[id number]] Collected:  05/14/15 2058    Order Status:  Completed Updated:  05/14/15 2104    Narrative:      INDICATION: Acute pelvic pain. Rule out torsion. LMP 05/11/2015.    TECHNIQUE: Transvaginal scanning. Grayscale and color Doppler imaging,  with spectral analysis.    FINDINGS: The uterus measures 6.1 x 3.7 x 3.2 cm, with a calculated  volume of 36 cc. The endometrium measures 5 mm. No uterine masses are  identified.    Both ovaries have a normal appearance. The right ovary measures 3.1 x  2.4 x 2.1 cm. The left ovary measures 2.5 x 2.0 x 2.1 cm. No adnexal  masses or fluid collections are identified.    Normal arterial and venous waveforms were obtained from both ovaries.      Impression:       Normal examination. No evidence of torsion.    Aquilla Hacker, MD   05/14/2015 9:00 PM        .    Medical Decision Making   I am the first provider for this patient.    I reviewed the vital signs, available nursing notes, past medical history, past surgical history, family history and social history.    Vital Signs-Reviewed the patient's vital signs.     Patient Vitals for the past 12 hrs:   BP Temp Pulse Resp   05/14/15 2111 113/60 mmHg 97.3 F (36.3 C) 78 16   05/14/15 1903 144/76 mmHg 98.2 F (36.8 C) 90 18       Pulse Oximetry Analysis - 100% on RA    Critical Care Time:    Old Medical Records: Nursing notes.     ED Course:   8:12 PM - TTP on bimanual exam. U/S to eval cyst vs fibroid    9:20 PM.  Reassessment at the time of disposition demonstrates that the pt is in no acute distress.  She has remained hemodynamically stable throughout the entire ED visit and is without  objective evidence for acute process requiring urgent intervention or hospitalization.  The pt is stable for discharge, counseling is provided as documented below, discussed symptomatic treatment and specific conditions for return.  Discussed treatment plan and follow up w/ GYN. CD machine is not working today. Work note provided. Will Rx as incisional pain for now.    Provider Notes: pt reports intermittent incisional pain since 2009 since her ectopic pregnancy surgery. Pain getting worse. Constant sharp pain x 1 week, finished her period. Pain worse w/ period & walking. No relief w/ 400mg  Ibuprofen & Tramadol. No trauma. ? Incisional pain. Needs pelvic. Rx pain, reassess    Diagnosis     Clinical Impression:   1. Pelvic pain  2. Incisional pain        Treatment Plan:   ED Disposition     Discharge Brandy Jennings discharge to home/self care.    Condition at disposition: Stable              _______________________________      Attestations: This note is prepared by Leonia Reader, acting as scribe for Dr. Carles Collet, MD. The scribe's documentation has been prepared under my direction and personally reviewed by me in its entirety.  I confirm that the note above accurately reflects all work, treatment, procedures, and medical decision making performed by me.    _______________________________            Annett Fabian, MD  05/16/15 2217

## 2015-05-14 NOTE — Discharge Instructions (Signed)
Pelvic Pain    You have been diagnosed with pelvic pain.    Pelvic pain affects many women who come here for evaluation. Your doctor has checked for the most dangerous causes of pelvic pain like appendicitis, pelvic abscess ectopic (tubal) pregnancy or other pregnancy complications. You do not have any of these problems.    Pelvic pain has many other causes that cannot be found by the type of testing that can be done here today. Some diseases may cause abnormal vaginal bleeding with pain. This includes uterine fibroids and endometriosis. For these problems, doctors often need to look at the pelvic organs. This is done in the operating room under general anesthesia (drugs to make you unconscious).    The doctor may suspect a specific cause of your pelvic pain after examining you today and doing some tests. Such a cause could be infection of the uterus or fallopian tubes (called "pelvic inflammatory disease" or "PID"). If so, treatment can start right away. It doesn t matter that it might take a few days to get all test results back.    Cancer rarely causes serious pelvic pain. However, it still needs to be considered as a possible cause. The emergency department or urgent care clinic often cannot diagnose or rule out cancer as the cause of pelvic pain. Cancer screening is not part of a routine emergency evaluation.    It is VERY IMPORTANT to follow up with your regular doctor. This will be to evaluate all possible causes of the pain.    It is OK to go home now.    You may need to return here or go to the nearest Emergency Department if symptoms that might signal complications develop. These include: Worsening infection, vaginal bleeding or some other problem.    Follow up with your gynecologist or regular doctor in the next few days. Tell your doctor about this visit.   If you do not have a regular doctor, tell the medical staff before leaving. That way, we can help make arrangements for you.    YOU SHOULD  SEEK MEDICAL ATTENTION IMMEDIATELY, EITHER HERE OR AT THE NEAREST EMERGENCY DEPARTMENT, IF ANY OF THE FOLLOWING OCCURS:   You have worse pain in the abdomen (belly), pelvis or back.   More and more vaginal discharge or bleeding, soaking of pads/tampons (more than one pad per hour), passing large clots.   Fever (temperature higher than 100.4F / 38C), chills, nausea, vomiting (throwing up).   Feeling dizzy, lightheaded or passing out.

## 2015-05-14 NOTE — ED Notes (Signed)
Pt c/o of ongoing pelvic pain since 2009 following an ectopic pregnancy surgery. This episode of pain has been for the past week, usually occurs with menses. Pain is worse with movement. Denies any N/V/D, or fevers. Pain is along the old incision. 9/10 pain.

## 2015-05-30 ENCOUNTER — Emergency Department
Admission: EM | Admit: 2015-05-30 | Discharge: 2015-05-30 | Disposition: A | Discharge status: Home / Self Care | Payer: Self-pay | Attending: Emergency Medicine | Admitting: Emergency Medicine

## 2015-05-30 ENCOUNTER — Emergency Department: Payer: Self-pay

## 2015-05-30 DIAGNOSIS — L905 Scar conditions and fibrosis of skin: Secondary | ICD-10-CM | POA: Insufficient documentation

## 2015-05-30 DIAGNOSIS — R102 Pelvic and perineal pain: Secondary | ICD-10-CM

## 2015-05-30 DIAGNOSIS — L7682 Other postprocedural complications of skin and subcutaneous tissue: Secondary | ICD-10-CM

## 2015-05-30 DIAGNOSIS — F17200 Nicotine dependence, unspecified, uncomplicated: Secondary | ICD-10-CM | POA: Insufficient documentation

## 2015-05-30 DIAGNOSIS — Z9889 Other specified postprocedural states: Secondary | ICD-10-CM | POA: Insufficient documentation

## 2015-05-30 DIAGNOSIS — R1030 Lower abdominal pain, unspecified: Secondary | ICD-10-CM | POA: Insufficient documentation

## 2015-05-30 LAB — URINALYSIS, REFLEX TO MICROSCOPIC EXAM IF INDICATED
Bilirubin, UA: NEGATIVE
Glucose, UA: NEGATIVE
Ketones UA: NEGATIVE
Leukocyte Esterase, UA: NEGATIVE
Nitrite, UA: NEGATIVE
Protein, UR: NEGATIVE
Specific Gravity UA: 1.01 (ref 1.001–1.035)
Urine pH: 6 (ref 5.0–8.0)
Urobilinogen, UA: NEGATIVE mg/dL

## 2015-05-30 LAB — URINE BHCG POC: Urine bHCG POC: NEGATIVE

## 2015-05-30 MED ORDER — OXYCODONE-ACETAMINOPHEN 5-325 MG PO TABS
1.0000 | ORAL_TABLET | Freq: Once | ORAL | Status: AC
Start: 2015-05-30 — End: 2015-05-30
  Administered 2015-05-30: 1 via ORAL
  Filled 2015-05-30: qty 1

## 2015-05-30 MED ORDER — OXYCODONE-ACETAMINOPHEN 5-325 MG PO TABS
ORAL_TABLET | ORAL | Status: DC
Start: 2015-05-30 — End: 2016-04-01

## 2015-05-30 NOTE — ED Provider Notes (Signed)
[facility] EMERGENCY DEPARTMENT   HISTORY AND PHYSICAL EXAM     Physician/Midlevel provider first contact with patient: 05/30/15 1717       Date Time: 05/30/2015 6:55 PM  Patient Name: Brandy Jennings  Attending Physician: Sharyne Richters, *  Mid-Level: Modesta Messing    History of Presenting Illness:   Brandy Jennings is a 24 y.o. female     Chief Complaint: suprapubic scar pain  History obtained from: Patient.  Onset/Duration: since 2010  Quality:   Severity:moderate  Aggravating Factors: none  Alleviating Factors: none  Associated Symptoms: none   Narrative/Additional Historical Findings:24 y.o. female presents to ER c/o lower suprapubic abdominal pain near scar from prior surgery for ectopic pregnancy. Pt reports pain there since the surgery in 2010 but worse today when her period started. Patient's last menstrual period was 05/29/2015. Tried tylenol and ibuprofen but no relief. No ob/gyn f/u. Pt had ed visit for same pain 05/14/2015. US pelvis was negative with ovarian torsion. No uterine masses, No ovarian cysts or masses.  Pelvic cultures negative for gonorrhea/chlamydia/trich/yeast.    PCP: Pcp, Noneorunknown, MD    Past Medical History:   History reviewed. No pertinent past medical history.    Past Surgical History:     Past Surgical History   Procedure Laterality Date   . Ectopic pregnancy surgery         Family History:   No family history on file.    Social History:     History     Social History   . Marital Status: Single     Spouse Name: N/A   . Number of Children: N/A   . Years of Education: N/A     Social History Main Topics   . Smoking status: Current Every Day Smoker   . Smokeless tobacco: Not on file   . Alcohol Use: No   . Drug Use: No   . Sexual Activity: Not on file     Other Topics Concern   . Not on file     Social History Narrative       Allergies:   No Known Allergies    Medications:     Patient's Medications   New Prescriptions    OXYCODONE-ACETAMINOPHEN (PERCOCET) 5-325 MG  PER TABLET    1 tablet by mouth every 4-6 hours as needed for pain;  Do not drive or operate machinery while taking this medicine   Previous Medications    NAPROXEN (NAPROSYN) 500 MG TABLET    Take 1 tablet (500 mg total) by mouth every 12 (twelve) hours as needed (pain, take w/ meals).   Modified Medications    No medications on file   Discontinued Medications    No medications on file       Review of Systems:     Review of Systems   Constitutional: Negative for fever.   Eyes: Negative for redness.   Respiratory: Negative for cough.    Cardiovascular: Negative for chest pain.   Gastrointestinal: Positive for abdominal pain. Negative for nausea, vomiting, diarrhea and constipation.   Genitourinary:        Patient's last menstrual period was 05/29/2015.      Musculoskeletal: Negative for back pain and neck pain.   Skin: Negative.    Neurological: Negative for dizziness, loss of consciousness and headaches.   Psychiatric/Behavioral: Negative for substance abuse.       Physical Exam:     Filed Vitals:    05/30/15 1727  BP: 127/79   Pulse: 82   Temp: 97 F (36.1 C)   Resp: 16   SpO2: 100%     Pulse Oximetry Analysis - Normal 100% on RA    Physical Exam   Constitutional: She is oriented to person, place, and time and well-developed, well-nourished, and in no distress.   Appears uncomfortable     HENT:   Head: Normocephalic and atraumatic.   Right Ear: External ear normal.   Left Ear: External ear normal.   Nose: Nose normal.   Mouth/Throat: Oropharynx is clear and moist.   Eyes: Conjunctivae are normal. Pupils are equal, round, and reactive to light. Right eye exhibits no discharge. Left eye exhibits no discharge. No scleral icterus.   Neck: Normal range of motion. Neck supple.   No midline ttp.   Cardiovascular: Normal rate, regular rhythm, normal heart sounds and intact distal pulses.    Pulmonary/Chest: Effort normal and breath sounds normal. No stridor. No respiratory distress.   Abdominal: Soft. Normal appearance  and bowel sounds are normal. There is tenderness in the suprapubic area. There is no CVA tenderness.       Musculoskeletal: Normal range of motion.   Neurological: She is alert and oriented to person, place, and time. Gait normal. GCS score is 15.   Skin: Skin is warm and dry. She is not diaphoretic.   Examined where exposed.   Psychiatric: Affect normal.   Nursing note and vitals reviewed.      Labs:     Results     Procedure Component Value Units Date/Time    UA, Reflex to Microscopic (pts 3 + yrs) [[id number]]  (Abnormal) Collected:  05/30/15 1757    Specimen Information:  Urine Updated:  05/30/15 1835     Urine Type Clean Catch      Color, UA Yellow      Clarity, UA Clear      Specific Gravity UA 1.010      Urine pH 6.0      Leukocyte Esterase, UA Negative      Nitrite, UA Negative      Protein, UR Negative      Glucose, UA Negative      Ketones UA Negative      Urobilinogen, UA Negative mg/dL      Bilirubin, UA Negative      Blood, UA Moderate (A)      RBC, UA 11 - 25 (A) /hpf      WBC, UA 0 - 5 /hpf      Squamous Epithelial Cells, Urine 0 - 5 /hpf     Urine BHCG POC [[id number]] Collected:  05/30/15 1748     Urine bHCG POC Negative Updated:  05/30/15 1802          Rads:     Radiology Results (24 Hour)     ** No results found for the last 24 hours. **          Procedures:   n/a    Assessment/Plan:     1. Suprapubic abdominal pain    2. Incisional pain    -percocet  -ibuprofen  -ob/gyn f/u in 1-2 days  -?endometriosis vs post op adhesions      Vital signs, nursing notes, past medical, surgical, social, and family history reviewed.    D/w Sharyne Richters, *    Discharge instructions were provided by this provider. Patient was asked to return to the emergency department immediately for any new or concerning symptoms  or if they get worse. Additional verbal discharge instructions were given and discussed with the patient. All questions answered.    CHART OWNERSHIP: I, Vanessa Kick, PA-C, am the primary  clinician of record.        Signed by: Vanessa Kick, PA-C                  Durwin Glaze [place], [place]  05/30/15 1856    Sharyne Richters, MD  05/30/15 2045

## 2015-05-30 NOTE — ED Notes (Signed)
Discharge instructions per pa

## 2015-05-30 NOTE — Discharge Instructions (Signed)
Abdominal Pain    You have been diagnosed with abdominal (belly) pain. The cause of your pain is not yet known.    Many things can cause abdominal pain. Examples include viral infections and bowel (intestine) spasms. You might need another examination or more tests to find out why you have pain.    At this time, your pain does not seem to be caused by anything dangerous. You do not need surgery. You do not need to stay in the hospital.     Though we don't believe your condition is dangerous right now, it is important to be careful. Sometimes a problem that seems mild can become serious later. This is why it is very important that you return here or go to the nearest Emergency Department unless you are 100% improved.    YOU SHOULD SEEK MEDICAL ATTENTION IMMEDIATELY, EITHER HERE OR AT THE NEAREST EMERGENCY DEPARTMENT, IF ANY OF THE FOLLOWING OCCURS:   Your pain does not go away or gets worse.   You cannot keep fluids down or your vomit is dark green.    You vomit blood or see blood in your stool. Blood might be bright red or dark red. It can also be black and look like tar.   You have a fever (temperature higher than 100.4F / 38C) or shaking chills.   Your skin or eyes look yellow or your urine looks brown.   You have severe diarrhea.

## 2015-05-30 NOTE — ED Notes (Signed)
States has pain s/p surgical site from several years ago, pain hurst when moving

## 2015-11-08 ENCOUNTER — Emergency Department: Payer: Self-pay

## 2015-11-08 ENCOUNTER — Emergency Department
Admission: EM | Admit: 2015-11-08 | Discharge: 2015-11-08 | Disposition: A | Discharge status: Home / Self Care | Payer: Self-pay | Attending: Emergency Medicine | Admitting: Emergency Medicine

## 2015-11-08 DIAGNOSIS — K529 Noninfective gastroenteritis and colitis, unspecified: Secondary | ICD-10-CM | POA: Insufficient documentation

## 2015-11-08 DIAGNOSIS — J40 Bronchitis, not specified as acute or chronic: Secondary | ICD-10-CM | POA: Insufficient documentation

## 2015-11-08 DIAGNOSIS — F17200 Nicotine dependence, unspecified, uncomplicated: Secondary | ICD-10-CM | POA: Insufficient documentation

## 2015-11-08 LAB — CBC AND DIFFERENTIAL
Basophils Absolute Automated: 0.02 10*3/uL (ref 0.00–0.20)
Basophils Automated: 0 %
Eosinophils Absolute Automated: 0.15 10*3/uL (ref 0.00–0.70)
Eosinophils Automated: 3 %
Hematocrit: 40.3 % (ref 37.0–47.0)
Hgb: 13.8 g/dL (ref 12.0–16.0)
Immature Granulocytes Absolute: 0 10*3/uL
Immature Granulocytes: 0 %
Lymphocytes Absolute Automated: 1.8 10*3/uL (ref 0.50–4.40)
Lymphocytes Automated: 41 %
MCH: 32.2 pg — ABNORMAL HIGH (ref 28.0–32.0)
MCHC: 34.2 g/dL (ref 32.0–36.0)
MCV: 93.9 fL (ref 80.0–100.0)
MPV: 8.7 fL — ABNORMAL LOW (ref 9.4–12.3)
Monocytes Absolute Automated: 0.26 10*3/uL (ref 0.00–1.20)
Monocytes: 6 %
Neutrophils Absolute: 2.17 10*3/uL (ref 1.80–8.10)
Neutrophils: 49 %
Nucleated RBC: 0 /100 WBC (ref 0–1)
Platelets: 349 10*3/uL (ref 140–400)
RBC: 4.29 10*6/uL (ref 4.20–5.40)
RDW: 12 % (ref 12–15)
WBC: 4.4 10*3/uL (ref 3.50–10.80)

## 2015-11-08 LAB — COMPREHENSIVE METABOLIC PANEL
ALT: 9 U/L (ref 0–55)
AST (SGOT): 15 U/L (ref 5–34)
Albumin/Globulin Ratio: 1.2 (ref 0.9–2.2)
Albumin: 3.9 g/dL (ref 3.5–5.0)
Alkaline Phosphatase: 91 U/L (ref 37–106)
Anion Gap: 7 (ref 5.0–15.0)
BUN: 10 mg/dL (ref 7.0–19.0)
Bilirubin, Total: 0.3 mg/dL (ref 0.2–1.2)
CO2: 26 mEq/L (ref 22–29)
Calcium: 9.5 mg/dL (ref 8.5–10.5)
Chloride: 108 mEq/L (ref 100–111)
Creatinine: 0.9 mg/dL (ref 0.6–1.0)
Globulin: 3.3 g/dL (ref 2.0–3.6)
Glucose: 85 mg/dL (ref 70–100)
Potassium: 3.9 mEq/L (ref 3.5–5.1)
Protein, Total: 7.2 g/dL (ref 6.0–8.3)
Sodium: 141 mEq/L (ref 136–145)

## 2015-11-08 LAB — HCG QUANTITATIVE: hCG, Quant.: <1.2

## 2015-11-08 LAB — GFR: EGFR: >60

## 2015-11-08 LAB — LIPASE: Lipase: 10 U/L (ref 8–78)

## 2015-11-08 LAB — HEMOLYSIS INDEX: Hemolysis Index: 7 (ref 0–18)

## 2015-11-08 MED ORDER — AZITHROMYCIN 250 MG PO TABS
ORAL_TABLET | ORAL | Status: DC
Start: 2015-11-08 — End: 2016-04-01

## 2015-11-08 MED ORDER — ALBUTEROL SULFATE HFA 108 (90 BASE) MCG/ACT IN AERS
2.0000 | INHALATION_SPRAY | RESPIRATORY_TRACT | Status: AC | PRN
Start: 2015-11-08 — End: 2015-12-08

## 2015-11-08 MED ORDER — PROMETHAZINE HCL 25 MG/ML IJ SOLN
12.5000 mg | Freq: Once | INTRAMUSCULAR | Status: AC
Start: 2015-11-08 — End: 2015-11-08
  Administered 2015-11-08: 12.5 mg via INTRAVENOUS
  Filled 2015-11-08: qty 1

## 2015-11-08 MED ORDER — ALBUTEROL SULFATE (2.5 MG/3ML) 0.083% IN NEBU
2.5000 mg | INHALATION_SOLUTION | Freq: Once | RESPIRATORY_TRACT | Status: AC
Start: 2015-11-08 — End: 2015-11-08
  Administered 2015-11-08: 2.5 mg via RESPIRATORY_TRACT
  Filled 2015-11-08: qty 3

## 2015-11-08 MED ORDER — SODIUM CHLORIDE 0.9 % IV BOLUS
1000.0000 mL | Freq: Once | INTRAVENOUS | Status: AC
Start: 2015-11-08 — End: 2015-11-08
  Administered 2015-11-08: 1000 mL via INTRAVENOUS

## 2015-11-08 NOTE — Discharge Instructions (Signed)
How to Feel Better When You Have Acute Bronchitis (CHEST COLD)    A chest cold, also known as acute bronchitis, is diagnosed by your symptoms and the physical exam performed by your provider today. It is almost always caused by a virus (such as RSV, Adenovirus, Influenza viruses, Parainfluenza & others). The virus makes the airways in your lungs swell and produce mucus. This is the cause of your cough.     We are glad that you came in today to be evaluated for your concerning symptoms. We hope to make you feel better while you are recovering. We expect that you should start to improve in the next several weeks. A cough can last up to 3-6 weeks.     Medications to relieve your cough:  Guaifenesin (OTC)  Dextromethorphan (OTC)  Antihistamines with / without decongestant (OTC)  Albuterol inhaler (Rx)  Steroids (Rx)  Benzonatate (Rx)  Codeine (Rx)    Other techniques to relieve your cough:  Get plenty of rest  Drink plenty of fluids  Use a clean humidifier or cool mist vaporizer  Avoid smoking or other pollutants  Breathe in steam from a bowl of hot water or shower  Use lozenges  Use honey    Wash your hands regularly to stay healthy!    When should I come back?  If you develop worsening cough, persistent fever, trouble breathing, vomiting after coughing, symptoms lasting more than 3 weeks or any new symptoms develop please return to the Emergency Department for re-evaluation.     Will antibiotics help?  Your chest cold (bronchitis) is most likely caused by a virus. Many research studies have shown that antibiotics do not help bronchitis and in fact maybe be harmful. The CDC, the [employer] and several other professional organizations all recommend avoiding antibiotics for acute bronchitis. Risks of inappropriate antibiotic use include: antibiotic resistant infections, allergic reaction, infectious diarrhea (C. dif). All of these can be very serious and may require you to be hospitalized. Antibiotics  are used to treat Bacterial Pneumonia, Pertussis (Whooping cough), Strep throat, or Severe sinusitis. They are sometimes indicated in people with COPD, cystic fibrosis or other previously diagnosed lung disorders. We do not suspect that you have any of these today.             Bronchitis    You have been diagnosed with bronchitis.    Bronchitis is an irritation of the large breathing tubes. It can be caused by tobacco smoke, air pollution, or an infection. Patients with bronchitis are short of breath and may cough up green or yellow mucous. These symptoms are usually worse at night, when lying flat and in wet weather. Most people with bronchitis do not need antibiotics. If your doctor prescribes antibiotics, fill the prescription and take all the medicine according to the instructions.    Bronchitis is usually treated with medicine to help stop coughing. An inhaler with albuterol (Ventolin/Proventil) is sometimes used to help with cough. It is best to use the inhaler with a spacer to help the medicine reach your lungs. The doctor can prescribe a spacer.    Bronchitis is usually caused by a virus. Antibiotics do not kill viruses. In fact, antibiotics do not affect viruses in any way. In the past, some doctors prescribed antibiotics for people with bronchitis. We now know that antibiotics are not helpful for most bronchitis patients. Patients who might need antibiotics are those with lung problems that don't go away, like emphysema or  COPD.    Your coughing and wheezing might last for 2 or 3 weeks! The symptoms should get better over this time period and not worse.    Your doctor prescribed an albuterol (Ventolin/Proventil) inhaler. Use it every four hours if you are wheezing, coughing, or short of breath. This will reduce symptoms.    Do not smoke. Research shows that smoking causes heart disease, cancer, and birth defects. Avoiding smoking will help your asthma. If you smoke, ask your doctor for ideas  about how to stop.   If you do not smoke, avoid others who do.    YOU SHOULD SEEK MEDICAL ATTENTION IMMEDIATELY, EITHER HERE OR AT THE NEAREST EMERGENCY DEPARTMENT, IF ANY OF THE FOLLOWING OCCURS:   You wheeze or have trouble breathing.   You have a fever (temperature higher than 100.25F / 38C), that won't go away.   You have chest pain.   You vomit or cannot keep liquids down or you feel weak or dizzy.   Your symptoms get worse over the next 2 or 3 days.      Vomiting / Diarrhea (Gastro), Viral    You have been diagnosed with vomiting and diarrhea, also called gastroenteritis or "stomach flu." In your case, the cause is thought to be a virus.    Although the term "stomach flu" is often used, gastroenteritis IS NOT THE FLU. The real flu is a serious respiratory (lung) infection caused by a virus called influenza.    Gastroenteritis almost always causes diarrhea and sometimes, bloody diarrhea. It might also cause abdominal cramping, vomiting, or fever (temperature higher than 100.25F / 38C). If you vomit, but do not have diarrhea, you may not have gastroenteritis. The usual treatment is medication for nausea and diarrhea. It might be helpful to avoid eating for the next day. Drink clear liquids, including water, broth, or diluted (watered down) juice. Avoid large heavy meals that make you sick to your stomach. Add more foods as you feel better. Symptoms usually improve over a few days but can last up to a week.    It is important to get enough fluids when you have a stomach virus.    Follow up with your primary doctor if you do not improve or if you feel worse.    YOU SHOULD SEEK MEDICAL ATTENTION IMMEDIATELY, EITHER HERE OR AT THE NEAREST EMERGENCY DEPARTMENT, IF ANY OF THE FOLLOWING OCCURS:   You do not urinate at least once every 8 hours.   Your vomiting becomes more frequent or you cannot keep fluids down.   Your vomit is bloody or dark green or your stool is bloody.   You do not improve over 2  to 3 days.   You develop any new symptoms or concerns.               Smoking Cessation (Edu)    Tobacco use can lead to tobacco and nicotine addiction. It can also cause serious medical problems. Smoking harms most of your body's organs. There is nicotine in all tobacco products, and this nicotine is most often the cause of tobacco addiction. When you inhale smoke, nicotine travels deep into your lungs. Nicotine affects many parts of your body, including your heart, blood vessels, and hormones. It also affects the way your body uses food (your metabolism) and your brain.    Many ex-smokers say that quitting smoking was very hard to do. It can take many tries to successfully quit tobacco and nicotine addiction. But, there  are medical therapies and resources that can help you. The physical addiction to nicotine causes unpleasant symptoms, called withdrawal symptoms, when you quit. But, remember that millions of people have quit smoking for good. You can be one of them!    There are many ways that quitting tobacco use can help your health. The CDC reports that tobacco smoke has more than 7,000 chemicals and that 70 of them can cause cancer.    Some of the ways that quitting smoking helps your health are:   It lowers the chance of getting of lung cancer and many other types of cancer.   It lowers the chance of getting coronary artery disease within a few years of quitting.   It lowers the chance you will have a stroke, peripheral vascular disease, and heart disease.   It lowers the chance that parts of your lungs will stop working (chronic obstructive pulmonary disease, COPD). This is a common cause of death in the [place].   It lowers the chance that women will have pregnancy (fertility) problems. It also lowers the chance that they will give birth early or to babies with low birth weight.   It also helps your family members by keeping them away from secondhand smoke.    Once you have decided to quit  smoking, it is important to know the ways you can quit. QUITTING IS HARD! Your success is decided, in part, by how strong your addiction to nicotine is. When the amount of nicotine in your body runs low, you begin to crave a cigarette. You may feel jittery, anxious, or short-tempered when you haven't had a cigarette.    Other withdrawal symptoms are:   Lightheadedness or dizziness.   Irritability or restlessness.   Depression or unusual changes to your mood.   Headaches.   Trouble falling and staying asleep.    There are many ways to quit smoking. No single way will work for everyone. You may need to try more than one way before you succeed.    Most cigarette smokers quit using treatments that have not been scientifically proven. You need to have a prescription to use some of the ways to quit. It is a good idea to talk to your doctor about how you plan to quit tobacco.    Know Your Choices    "Cold [place]": This is the most popular and well-known way for smokers to quit. The method is to suddenly stop using tobacco, without preparing for it and without using any nicotine gums or patches. Although it is the most popular way to quit, it has a high failure rate. Fewer than 5% of tobacco users are able to quit this way.    Over-the-counter medicines: These are nicotine replacement medicines that do not need a doctor's prescription. They can take the form of a gum, a patch, or a pill. You can buy them at most pharmacies. They help lessen your craving for nicotine and your withdrawal symptoms. These medicines have lower levels of nicotine than cigarettes do.    Counseling and support groups: Counseling can help you figure out what causes your urges to smoke. Counseling improves the success rate of all other ways to quit. It is hard to quit by yourself. Scheduled group or individual counseling sessions can give you a lot of encouragement.    Prescription medications: Your doctor can prescribe medicines that  help you quit smoking. They can be inhalers or pills. A nicotine inhaler, called a cartridge, might  be helpful for some people. Using it is like smoking, and it keeps your hands busy. Cartridges may not be enough for heavy smokers. Bupropion (Zyban) and varenicline (Chantix) are easy to take pills that do not have any nicotine in them. You can ask your doctor to prescribe them. Both bupropion and varenicline improve the success rates of quitting. But, these medicines have risks and benefits. They should always be taken with the supervision of a doctor.     Bupropion (Zyban, Wellbutrin): Bupropion is an anti-depressant medication, available by prescription, that lessens the symptoms of withdrawal and the urge to smoke. It works by affecting the brain chemicals that cause nicotine cravings. It works best if you start to use it 1 to 2 weeks before you quit smoking. This medicine should not be used if you have history of seizures, serious head injuries, or heavy alcohol use.     Varenicline (Chantix): This medicine works by affecting the parts of your brain that take pleasure in nicotine. It works by decreasing the pleasure of smoking and by lessening the symptoms of nicotine withdrawal. Like bupropion, you should start varenicline about one week before you quit smoking. Varenicline is most often a safe medicine to help you quit smoking. But, it should be taken with some caution by people who have depression or other mental health problems.    For more information visit:   [employer] Quitline: 6-045-40J-WJXB (865) 421-5865)   UEarly.se (The Centers for Disease Control)   www.smokefree.gov ([employer])   www.cancer.org (The American Cancer Society)

## 2015-11-08 NOTE — ED Provider Notes (Signed)
EMERGENCY DEPARTMENT HISTORY AND PHYSICAL EXAM     Physician/Midlevel provider first contact with patient: 11/08/15 1148         Date: 11/08/2015  Patient Name: Brandy Jennings    History of Presenting Illness     Chief Complaint   Patient presents with   . Emesis   . Nausea       History Provided By: Patient    Chief Complaint: Vomiting  Onset: Three days  Timing: Intermittent  Severity: Mild  Exacerbating factors: None  Alleviating factors: None  Associated Symptoms: Nausea, cough, and diarrhea  Pertinent Negatives: No fever    Additional History: Brandy M Martel is a 24 y.o. female with no history of asthma presenting to the ED with intermittent vomiting which began three days ago. Pt also reports nausea, diarrhea, and a productive cough which began five weeks ago. She denies fever. Pt's LNMP "just ended." She states that her LNMP was shorter in duration than usual. Pt took a home pregnancy test which was negative. She also reports tobacco use.    PCP: Pcp, Noneorunknown, MD      No current facility-administered medications for this encounter.     Current Outpatient Prescriptions   Medication Sig Dispense Refill   . albuterol (PROVENTIL HFA;VENTOLIN HFA) 108 (90 BASE) MCG/ACT inhaler Inhale 2 puffs into the lungs every 4 (four) hours as needed for Wheezing or Shortness of Breath (coughing). Dispense with spacer 1 Inhaler 0   . azithromycin (ZITHROMAX Z-PAK) 250 MG tablet Take two tabs on day one and then on tablet daily for days 2 through 5 6 tablet 0   . naproxen (NAPROSYN) 500 MG tablet Take 1 tablet (500 mg total) by mouth every 12 (twelve) hours as needed (pain, take w/ meals). 30 tablet 0   . oxyCODONE-acetaminophen (PERCOCET) 5-325 MG per tablet 1 tablet by mouth every 4-6 hours as needed for pain;  Do not drive or operate machinery while taking this medicine 10 tablet 0       Past History     Past Medical History:  History reviewed. No pertinent past medical history.    Past Surgical History:  Past  Surgical History   Procedure Laterality Date   . Ectopic pregnancy surgery         Family History:  No family history on file.    Social History:  Social History   Substance Use Topics   . Smoking status: Current Every Day Smoker   . Smokeless tobacco: None   . Alcohol Use: No       Allergies:  No Known Allergies    Review of Systems     Review of Systems   Constitutional: Negative for fever.   HENT: Negative for nosebleeds.    Eyes: Negative for redness.   Respiratory: Positive for cough.    Gastrointestinal: Positive for nausea, vomiting and diarrhea.   Genitourinary: Negative for dysuria.   Musculoskeletal: Negative for falls.   Skin: Negative for rash.   Endo/Heme/Allergies:        NKDA   Psychiatric/Behavioral: Negative for suicidal ideas.        Physical Exam   BP 113/59 mmHg  Pulse 67  Temp(Src) 97.6 F (36.4 C)  Resp 18  Ht 5\' 4"  (1.626 m)  Wt 63.504 kg  BMI 24.02 kg/m2  SpO2 99%  LMP 11/05/2015    CONSTITUTIONAL   Vital signs reviewed, Well appearing, Patient appears comfortable.  HEAD   Atraumatic. Normocephalic.  EYES  Eyes are normal to inspection, No discharge from eyes, Sclera are normal.  ENT   Posterior pharynx normal. Mildly dry mucous membranes.  NECK   Normal ROM, No jugular venous distention, no tenderness, no meningeal signs.  RESPIRATORY CHEST  Chest is nontender.  Bilateral expiratory wheezes, No respiratory distress.  CARDIOVASCULAR   RRR, No murmurs.  ABDOMEN   Abdomen is nontender, No distension, No peritoneal signs.  BACK   There is no CVA Tenderness, There is no tenderness to palpation.  NEURO   Alert and oriented, appropriate. No focal motor deficits. No focal sensory deficits. Speech normal.  SKIN   Skin is warm, Skin is dry, Skin Is normal color.  EXTREMITIES no c/c/e, no calf tenderness, negative homans      Diagnostic Study Results     Labs -     Results     Procedure Component Value Units Date/Time    hCG, Quantitative [[id number]] Collected:  11/08/15 1134     hCG, Quant.  <1.2 Updated:  11/08/15 1256    CBC with differential [[id number]]  (Abnormal) Collected:  11/08/15 1134    Specimen Information:  Blood from Blood Updated:  11/08/15 1236     WBC 4.40 x10 3/uL      Hgb 13.8 g/dL      Hematocrit 96.2 %      Platelets 349 x10 3/uL      RBC 4.29 x10 6/uL      MCV 93.9 fL      MCH 32.2 (H) pg      MCHC 34.2 g/dL      RDW 12 %      MPV 8.7 (L) fL      Neutrophils 49 %      Lymphocytes Automated 41 %      Monocytes 6 %      Eosinophils Automated 3 %      Basophils Automated 0 %      Immature Granulocyte 0 %      Nucleated RBC 0 /100 WBC      Neutrophils Absolute 2.17 x10 3/uL      Abs Lymph Automated 1.80 x10 3/uL      Abs Mono Automated 0.26 x10 3/uL      Abs Eos Automated 0.15 x10 3/uL      Absolute Baso Automated 0.02 x10 3/uL      Absolute Immature Granulocyte 0.00 x10 3/uL     Hemolysis index [[id number]] Collected:  11/08/15 1134     Hemolysis Index 7 Updated:  11/08/15 1159    GFR [[id number]] Collected:  11/08/15 1134     EGFR >60.0 Updated:  11/08/15 1159    Comprehensive Metabolic Panel (CMP) [[id number]] Collected:  11/08/15 1134    Specimen Information:  Blood Updated:  11/08/15 1159     Glucose 85 mg/dL      BUN 25.9 mg/dL      Creatinine 0.9 mg/dL      Sodium 563 mEq/L      Potassium 3.9 mEq/L      Chloride 108 mEq/L      CO2 26 mEq/L      Calcium 9.5 mg/dL      Protein, Total 7.2 g/dL      Albumin 3.9 g/dL      AST (SGOT) 15 U/L      ALT 9 U/L      Alkaline Phosphatase 91 U/L      Bilirubin, Total 0.3 mg/dL      Globulin 3.3  g/dL      Albumin/Globulin Ratio 1.2      Anion Gap 7.0     Lipase [[id number]] Collected:  11/08/15 1134    Specimen Information:  Blood Updated:  11/08/15 1159     Lipase 10 U/L           Radiologic Studies -   Radiology Results (24 Hour)     Procedure Component Value Units Date/Time    Chest AP Portable [[id number]] Collected:  11/08/15 1244    Order Status:  Completed Updated:  11/08/15 1248    Narrative:      HISTORY: Cough    .    TECHNIQUE: Single  frontal view of the chest was obtained.     PRIORS: 05/10/2010.    FINDINGS: The lung fields are clear. There are no significant pleural  effusions. The cardiac silhouette and hila are normal. The trachea is  midline. The bony structures are essentially unremarkable.       Impression:       No active lung disease.    Georgana Curio, MD   11/08/2015 12:44 PM        .    Medical Decision Making   I am the first provider for this patient.    I reviewed the vital signs, available nursing notes, past medical history, past surgical history, family history and social history.    Vital Signs-Reviewed the patient's vital signs.     Patient Vitals for the past 12 hrs:   BP Temp Pulse Resp   11/08/15 1336 113/59 mmHg 97.6 F (36.4 C) 67 18   11/08/15 0950 123/67 mmHg (!) 96.5 F (35.8 C) 74 16       Pulse Oximetry Analysis - Normal 100% on RA    Old Medical Records: Old medical records.  Nursing notes.     ED Course:     1:32 PM - Pt is asleep upon reevaluation to inform her on lab and imaging results.     2:09 PM - Pt is now ambulatory to the restroom.    2:12 PM - Discussed results with pt and counseled on diagnosis, f/u plans, medication use, and signs and symptoms when to return to ED.  Pt is stable and ready for discharge.       Provider Notes:   This is a 24 year old woman who is typically quite well, however, is a current daily smoker.  Presents with 2 complaints  1.  Cough for 5 weeks, mild wheezing on exam.  We will check a chest x-ray and 2 nebulizers.  2.  Nausea, vomiting, diarrhea for 3 days, concordant with the acute gastroenteritis that we have been seeing over the past 2 weeks.  Many patients have had similar symptoms.  Abdominal exam is generally benign.  We will check labs right IV hydration and antiemetic.  States she had an atypical.  We will check a serum hCG level to rule out pregnancy      Diagnosis     Clinical Impression:   1. Bronchitis    2. AGE (acute gastroenteritis)        Treatment Plan:   ED  Disposition     Discharge Brandy M Monk discharge to home/self care.    Condition at disposition: Stable              _______________________________      Attestations: This note is prepared by Adela Glimpse, acting as scribe for Marlana Latus, MD, FACEP. The scribe's  documentation has been prepared under my direction and personally reviewed by me in its entirety.  I confirm that the note above accurately reflects all work, treatment, procedures, and medical decision making performed by me.    _______________________________    Judi Cong, MD  11/08/15 [phone]

## 2015-11-08 NOTE — ED Notes (Signed)
Pt presents ambulatory to the ED co N/V x three days. Three episodes of vomiting today any time she eats or drinks anything. Pt states that she has had cold like sx x one month. Pt is alert and oriented x 4.

## 2015-11-08 NOTE — ED Notes (Signed)
Bed: [facility]  Expected date:   Expected time:   Means of arrival:   Comments:  T

## 2016-03-31 DIAGNOSIS — F17200 Nicotine dependence, unspecified, uncomplicated: Secondary | ICD-10-CM | POA: Insufficient documentation

## 2016-03-31 DIAGNOSIS — R590 Localized enlarged lymph nodes: Secondary | ICD-10-CM | POA: Insufficient documentation

## 2016-03-31 NOTE — ED Notes (Signed)
Nausea x "a couple of days". No vomiting.Also reports swelling inner thigh bilat first noticed this morning.

## 2016-04-01 ENCOUNTER — Emergency Department
Admission: EM | Admit: 2016-04-01 | Discharge: 2016-04-01 | Disposition: A | Discharge status: Home / Self Care | Payer: Self-pay | Attending: Emergency Medicine | Admitting: Emergency Medicine

## 2016-04-01 ENCOUNTER — Emergency Department: Payer: Self-pay

## 2016-04-01 DIAGNOSIS — R590 Localized enlarged lymph nodes: Secondary | ICD-10-CM

## 2016-04-01 LAB — URINALYSIS, REFLEX TO MICROSCOPIC EXAM IF INDICATED
Bilirubin, UA: NEGATIVE
Blood, UA: NEGATIVE
Glucose, UA: NEGATIVE
Ketones UA: NEGATIVE
Nitrite, UA: NEGATIVE
Protein, UR: NEGATIVE
Specific Gravity UA: 1.01 (ref 1.001–1.035)
Urine pH: 6 (ref 5.0–8.0)
Urobilinogen, UA: NEGATIVE mg/dL

## 2016-04-01 LAB — URINE BHCG POC: Urine bHCG POC: NEGATIVE

## 2016-04-01 MED ORDER — ONDANSETRON 4 MG PO TBDP
4.0000 mg | ORAL_TABLET | Freq: Two times a day (BID) | ORAL | Status: DC | PRN
Start: 2016-04-01 — End: 2016-11-24

## 2016-04-01 NOTE — Discharge Instructions (Signed)
Lymphadenopathy    You have been seen for swollen lymph nodes. The medical term for this is "lymphadenopathy."    Lymph nodes are part of the immune system. They help your body fight infection. They can get inflamed or swollen for several reasons. These include:   Infections, both bacterial and viral. When the lymph nodes are infected, we call this lymphadenitis.   Tumors that can be cancerous, like lymphoma.   Autoimmune diseases. This includes lupus and rheumatoid arthritis.    Other problems with your immune system.   Bites from animals.    Symptoms of lymphadenopathy include:   Swollen, enlarged lumps. These can be at the back of the head, at the front of the neck in the armpits or groin.   Pain in the lymph nodes.   Fever (temperature higher than 100.4F / 38C).    Use anti-inflammatory medicine like ibuprofen (Advil or Motrin) to help the pain and swelling.    It is OK for you to go home today.    YOU SHOULD SEEK MEDICAL ATTENTION IMMEDIATELY, EITHER HERE OR AT THE NEAREST EMERGENCY DEPARTMENT, IF ANY OF THE FOLLOWING OCCUR:   Your lymphadenopathy gets worse.   You have fever (temperature higher than 100.4F / 38C) that lasts more than a few days.   You have any other problems or concerns.

## 2016-04-01 NOTE — ED Provider Notes (Signed)
EMERGENCY DEPARTMENT HISTORY AND PHYSICAL EXAM     Physician/Midlevel provider first contact with patient: 04/01/16 0050         Date: 04/01/2016  Patient Name: Brandy Jennings    History of Presenting Illness     Chief Complaint   Patient presents with   . Nausea   . Groin Swelling       History Provided By: Patient    Chief Complaint: nausea and groin swelling  Onset: today  Timing: Gradual, constant  Location: bilateral groin  Quality: swelling, tender  Severity: Moderate  Exacerbating factors: palpation  Alleviating factors: none  Associated Symptoms: nausea  Pertinent Negatives: No vomiting. No fever. No abd pain. No dysuria or hematuria. No vaginal d/c.     Additional History: Brandy M Murrillo is a 25 y.o. female with no pmhx presents c/o bilateral groin swelling and nausea that started today. No v/d. No fever. Pt denies STD exposure. Pt states she was just tested for STDS (including HIV, syphilis, gonorrhea and chlamydia) 1 week ago and it was negative.  Pt sexually active with 1 partner, uses condoms.     PCP: Pcp, Noneorunknown, MD  SPECIALISTS:    No current facility-administered medications for this encounter.     Current Outpatient Prescriptions   Medication Sig Dispense Refill   . naproxen (NAPROSYN) 500 MG tablet Take 1 tablet (500 mg total) by mouth every 12 (twelve) hours as needed (pain, take w/ meals). 30 tablet 0   . ondansetron (ZOFRAN ODT) 4 MG disintegrating tablet Take 1 tablet (4 mg total) by mouth 2 (two) times daily as needed (nausea/vomiting). 6 tablet 0       Past History     Past Medical History:  History reviewed. No pertinent past medical history.    Past Surgical History:  Past Surgical History   Procedure Laterality Date   . Ectopic pregnancy surgery         Family History:  History reviewed. No pertinent family history.    Social History:  Social History   Substance Use Topics   . Smoking status: Current Every Day Smoker   . Smokeless tobacco: None   . Alcohol Use: No        Allergies:  No Known Allergies    Review of Systems     Review of Systems   Constitutional: Negative for fever and chills.   Gastrointestinal: Positive for nausea. Negative for vomiting, abdominal pain and diarrhea.   Genitourinary: Negative for dysuria, frequency and hematuria.        NO vag d/c   Skin:        Swelling bilateral groin       Physical Exam   BP 129/81 mmHg  Pulse 88  Temp(Src) 98.2 F (36.8 C) (Oral)  Resp 16  Wt 68.04 kg  SpO2 100%  LMP 03/12/2016    Physical Exam   Constitutional: She is well-developed, well-nourished, and in no distress.   Neck: Neck supple.   Pulmonary/Chest: Effort normal. No respiratory distress.   Abdominal: Soft. She exhibits no distension. There is no tenderness. There is no guarding.   Lymphadenopathy:     She has no cervical adenopathy.        Right: Inguinal adenopathy present.        Left: Inguinal adenopathy present.   Bilateral inguinal lymphadenopathy mildly ttp. NO erythema or induration.    Skin: Skin is warm. No rash noted.       Diagnostic Study Results  Labs -     Results     Procedure Component Value Units Date/Time    Urine BHCG POC [[id number]] Collected:  04/01/16 0129     Urine bHCG POC Negative Updated:  04/01/16 0136    UA, Reflex to Microscopic (pts 3 + yrs) [[id number]]  (Abnormal) Collected:  04/01/16 0105    Specimen Information:  Urine Updated:  04/01/16 0127     Urine Type Clean Catch      Color, UA Yellow      Clarity, UA Clear      Specific Gravity UA 1.010      Urine pH 6.0      Leukocyte Esterase, UA Trace (A)      Nitrite, UA Negative      Protein, UR Negative      Glucose, UA Negative      Ketones UA Negative      Urobilinogen, UA Negative mg/dL      Bilirubin, UA Negative      Blood, UA Negative      RBC, UA 3 - 5 /hpf      WBC, UA 0 - 5 /hpf      Squamous Epithelial Cells, Urine 0 - 5 /hpf     CHLAMYDIA/GC BY PCR [[id number]] Collected:  04/01/16 0105    Specimen Information:  Urine, First Catch Updated:  04/01/16 0109     Narrative:      Call Lab first          Radiologic Studies -   Radiology Results (24 Hour)     ** No results found for the last 24 hours. **      .    Medical Decision Making   I am the first provider for this patient.    I reviewed the vital signs, available nursing notes, past medical history, past surgical history, family history and social history.    Vital Signs-Reviewed the patient's vital signs.     Patient Vitals for the past 12 hrs:   BP Temp Pulse Resp   03/31/16 2339 129/81 mmHg 98.2 F (36.8 C) 88 16       Pulse Oximetry Analysis - Normal 100% on RA    Provider Notes: PT just had STD testing 1 week ago. Abd soft, NT. No vag d/c or rash. NO urinary sx or other lymphadenopathy. Pt to tx symptomatically and f/u with pmd or gyn this week.       Diagnosis     Clinical Impression:   1. Inguinal lymphadenopathy        Treatment Plan:   ED Disposition     Discharge Brandy M Grade discharge to home/self care.    Condition at disposition: Stable                Donnal Debar, [place]  04/01/16 0347    Jethro Bastos, MD  04/01/16 2024

## 2016-08-13 DIAGNOSIS — Z8759 Personal history of other complications of pregnancy, childbirth and the puerperium: Secondary | ICD-10-CM | POA: Insufficient documentation

## 2016-08-20 ENCOUNTER — Emergency Department
Admission: EM | Admit: 2016-08-20 | Discharge: 2016-08-20 | Disposition: A | Discharge status: Home / Self Care | Payer: PRIVATE HEALTH INSURANCE | Attending: Emergency Medicine | Admitting: Emergency Medicine

## 2016-08-20 ENCOUNTER — Emergency Department: Payer: PRIVATE HEALTH INSURANCE

## 2016-08-20 DIAGNOSIS — R1084 Generalized abdominal pain: Secondary | ICD-10-CM | POA: Insufficient documentation

## 2016-08-20 DIAGNOSIS — K529 Noninfective gastroenteritis and colitis, unspecified: Secondary | ICD-10-CM

## 2016-08-20 DIAGNOSIS — Z331 Pregnant state, incidental: Secondary | ICD-10-CM | POA: Insufficient documentation

## 2016-08-20 LAB — COMPREHENSIVE METABOLIC PANEL
ALT: 13 U/L (ref 0–55)
AST (SGOT): 18 U/L (ref 5–34)
Albumin/Globulin Ratio: 1.1 (ref 0.9–2.2)
Albumin: 3.6 g/dL (ref 3.5–5.0)
Alkaline Phosphatase: 64 U/L (ref 37–106)
Anion Gap: 6 (ref 5.0–15.0)
BUN: 6 mg/dL — ABNORMAL LOW (ref 7.0–19.0)
Bilirubin, Total: 0.2 mg/dL (ref 0.2–1.2)
CO2: 24 mEq/L (ref 22–29)
Calcium: 9.2 mg/dL (ref 8.5–10.5)
Chloride: 105 mEq/L (ref 100–111)
Creatinine: 0.8 mg/dL (ref 0.6–1.0)
Globulin: 3.4 g/dL (ref 2.0–3.6)
Glucose: 77 mg/dL (ref 70–100)
Potassium: 3.8 mEq/L (ref 3.5–5.1)
Protein, Total: 7 g/dL (ref 6.0–8.3)
Sodium: 135 mEq/L — ABNORMAL LOW (ref 136–145)

## 2016-08-20 LAB — CBC AND DIFFERENTIAL
Absolute NRBC: 0 10*3/uL
Basophils Absolute Automated: 0.03 10*3/uL (ref 0.00–0.20)
Basophils Automated: 0.5 %
Eosinophils Absolute Automated: 0.16 10*3/uL (ref 0.00–0.70)
Eosinophils Automated: 2.8 %
Hematocrit: 37.3 % (ref 37.0–47.0)
Hgb: 13 g/dL (ref 12.0–16.0)
Immature Granulocytes Absolute: 0.01 10*3/uL
Immature Granulocytes: 0.2 %
Lymphocytes Absolute Automated: 1.94 10*3/uL (ref 0.50–4.40)
Lymphocytes Automated: 33.6 %
MCH: 32.1 pg — ABNORMAL HIGH (ref 28.0–32.0)
MCHC: 34.9 g/dL (ref 32.0–36.0)
MCV: 92.1 fL (ref 80.0–100.0)
MPV: 8.3 fL — ABNORMAL LOW (ref 9.4–12.3)
Monocytes Absolute Automated: 0.51 10*3/uL (ref 0.00–1.20)
Monocytes: 8.8 %
Neutrophils Absolute: 3.12 10*3/uL (ref 1.80–8.10)
Neutrophils: 54.1 %
Nucleated RBC: 0 /100 WBC (ref 0.0–1.0)
Platelets: 355 10*3/uL (ref 140–400)
RBC: 4.05 10*6/uL — ABNORMAL LOW (ref 4.20–5.40)
RDW: 12 % (ref 12–15)
WBC: 5.77 10*3/uL (ref 3.50–10.80)

## 2016-08-20 LAB — LIPASE: Lipase: 14 U/L (ref 8–78)

## 2016-08-20 LAB — URINE BHCG POC
Urine bHCG POC: POSITIVE — AB
Urine bHCG POC: POSITIVE — AB

## 2016-08-20 LAB — URINALYSIS, REFLEX TO MICROSCOPIC EXAM IF INDICATED
Bilirubin, UA: NEGATIVE
Blood, UA: NEGATIVE
Glucose, UA: NEGATIVE
Ketones UA: NEGATIVE
Leukocyte Esterase, UA: NEGATIVE
Nitrite, UA: NEGATIVE
Protein, UR: NEGATIVE
Specific Gravity UA: 1.005 (ref 1.001–1.035)
Urine pH: 7 (ref 5.0–8.0)
Urobilinogen, UA: NEGATIVE mg/dL

## 2016-08-20 LAB — HEMOLYSIS INDEX: Hemolysis Index: 18 (ref 0–18)

## 2016-08-20 LAB — TYPE AND SCREEN
AB Screen Gel: NEGATIVE
ABO Rh: O NEG

## 2016-08-20 LAB — HCG QUANTITATIVE: hCG, Quant.: 90889.8

## 2016-08-20 LAB — GFR: EGFR: >60

## 2016-08-20 MED ORDER — PROMETHAZINE HCL 25 MG PO TABS
25.0000 mg | ORAL_TABLET | Freq: Four times a day (QID) | ORAL | 0 refills | Status: DC | PRN
Start: 2016-08-20 — End: 2016-11-24

## 2016-08-20 MED ORDER — ACETAMINOPHEN 325 MG PO TABS
650.0000 mg | ORAL_TABLET | Freq: Four times a day (QID) | ORAL | 0 refills | Status: DC | PRN
Start: 2016-08-20 — End: 2016-11-24

## 2016-08-20 MED ORDER — PROMETHAZINE HCL 25 MG PO TABS
25.0000 mg | ORAL_TABLET | Freq: Once | ORAL | Status: AC
Start: 2016-08-20 — End: 2016-08-20
  Administered 2016-08-20: 25 mg via ORAL
  Filled 2016-08-20: qty 1

## 2016-08-20 MED ORDER — SODIUM CHLORIDE 0.9 % IV BOLUS
1000.0000 mL | Freq: Once | INTRAVENOUS | Status: AC
Start: 2016-08-20 — End: 2016-08-20
  Administered 2016-08-20: 1000 mL via INTRAVENOUS

## 2016-08-20 NOTE — ED Provider Notes (Signed)
EMERGENCY DEPARTMENT HISTORY AND PHYSICAL EXAM     Physician/Midlevel provider first contact with patient: 08/20/16 1636         Date: 08/20/2016  Patient Name: Brandy Jennings    History of Presenting Illness     History Provided By: pt    Chief Complaint   Patient presents with   . Abdominal Pain     Onset:   Timing:   Location:   Quality:   Severity:   Modifying Factors:   Associated Symptoms:     Additional History: Brandy Jennings is a 25 y.o. female BIB self with reports of abdominal pain x 2 days. There is associated nausea and diarrhea. Pain is described as "crampy", generalized, worse on L side, non-radiating. Diarrhea has occurred 3 times today, is described as watery. Pt reports household contacts have a "stomach bug", which she thinks she caught. LMP 06/23/16, has not taken home pregnancy test. OB hx significant for ectopic 10 years ago requiring salpingectomy. Denies F/C, emesis, vaginal bleeding, urinary sxs. No meds taken at home.     PCP: Pcp, Noneorunknown, MD      No current facility-administered medications for this encounter.      Current Outpatient Prescriptions   Medication Sig Dispense Refill   . acetaminophen (TYLENOL) 325 MG tablet Take 2 tablets (650 mg total) by mouth every 6 (six) hours as needed for Pain. 20 tablet 0   . naproxen (NAPROSYN) 500 MG tablet Take 1 tablet (500 mg total) by mouth every 12 (twelve) hours as needed (pain, take w/ meals). 30 tablet 0   . ondansetron (ZOFRAN ODT) 4 MG disintegrating tablet Take 1 tablet (4 mg total) by mouth 2 (two) times daily as needed (nausea/vomiting). 6 tablet 0   . promethazine (PHENERGAN) 25 MG tablet Take 1 tablet (25 mg total) by mouth every 6 (six) hours as needed for Nausea.for up to 4 doses 20 tablet 0       Past History     Past Medical History:  History reviewed. No pertinent past medical history.    Past Surgical History:  Past Surgical History:   Procedure Laterality Date   . ECTOPIC PREGNANCY SURGERY         Family  History:  History reviewed. No pertinent family history.    Social History:  Social History   Substance Use Topics   . Smoking status: Never Smoker   . Smokeless tobacco: Never Used   . Alcohol use No       Allergies:  No Known Allergies    Review of Systems     Review of Systems   Constitutional: Negative for chills and fever.   Eyes: Negative for redness.   Respiratory: Negative for shortness of breath and stridor.    Cardiovascular: Negative for chest pain.   Gastrointestinal: Positive for abdominal pain, diarrhea and nausea. Negative for vomiting.   Genitourinary: Negative for dysuria, flank pain and hematuria.   Musculoskeletal: Negative for back pain.   Skin: Negative for rash.   Neurological: Negative for dizziness.   Endo/Heme/Allergies:        Nkda   Psychiatric/Behavioral: The patient is not nervous/anxious.          Physical Exam     Vitals:    08/20/16 1607 08/20/16 1848   BP: 133/80 122/64   Pulse: 87 76   Resp: 16 16   Temp: 97.9 F (36.6 C) 98 F (36.7 C)   TempSrc: Oral  SpO2: 100% 100%   Weight: 68 kg    Height: 5\' 4"  (1.626 m)        Physical Exam   Constitutional: She is oriented to person, place, and time. She appears well-developed and well-nourished.  Non-toxic appearance. No distress.   HENT:   Head: Normocephalic and atraumatic.   Right Ear: Hearing normal.   Left Ear: Hearing normal.   Mouth/Throat: Oropharynx is clear and moist and mucous membranes are normal.   Eyes: Conjunctivae, EOM and lids are normal.   Neck: Normal range of motion and phonation normal. Neck supple.   Cardiovascular: Normal rate and regular rhythm.    Pulmonary/Chest: Effort normal and breath sounds normal.   Abdominal: Soft. Normal appearance and bowel sounds are normal. She exhibits no distension and no mass. There is generalized tenderness (generalized, L>R, mild). There is no rebound, no CVA tenderness, no tenderness at McBurney's point and negative Murphy's sign.   Neurological: She is alert and oriented to  person, place, and time. Gait normal.   Skin: Skin is warm and dry.   Psychiatric: She has a normal mood and affect. Her behavior is normal.   Nursing note and vitals reviewed.        Diagnostic Study Results     Labs -     Results     Procedure Component Value Units Date/Time    Type and Screen [[id number]] Collected:  08/20/16 1751    Specimen:  Blood Updated:  08/20/16 1844     ABO Rh O NEG     AB Screen Gel NEG    GFR [[id number]] Collected:  08/20/16 1659     Updated:  08/20/16 1725     EGFR >60.0    Comprehensive metabolic panel [[id number]]  (Abnormal) Collected:  08/20/16 1659    Specimen:  Blood Updated:  08/20/16 1725     Glucose 77 mg/dL      BUN 6.0 (L) mg/dL      Creatinine 0.8 mg/dL      Sodium 086 (L) mEq/L      Potassium 3.8 mEq/L      Chloride 105 mEq/L      CO2 24 mEq/L      Calcium 9.2 mg/dL      Protein, Total 7.0 g/dL      Albumin 3.6 g/dL      AST (SGOT) 18 U/L      ALT 13 U/L      Alkaline Phosphatase 64 U/L      Bilirubin, Total 0.2 mg/dL      Globulin 3.4 g/dL      Albumin/Globulin Ratio 1.1     Anion Gap 6.0    Lipase [[id number]] Collected:  08/20/16 1659    Specimen:  Blood Updated:  08/20/16 1725     Lipase 14 U/L     Hemolysis index [[id number]] Collected:  08/20/16 1659     Updated:  08/20/16 1725     Hemolysis Index 18    UA, Reflex to Microscopic (pts 3 + yrs) [[id number]] Collected:  08/20/16 1659    Specimen:  Urine Updated:  08/20/16 1710     Urine Type Clean Catch     Color, UA Straw     Clarity, UA Clear     Specific Gravity UA 1.005     Urine pH 7.0     Leukocyte Esterase, UA Negative     Nitrite, UA Negative     Protein, UR Negative     Glucose,  UA Negative     Ketones UA Negative     Urobilinogen, UA Negative mg/dL      Bilirubin, UA Negative     Blood, UA Negative    CBC with differential [[id number]]  (Abnormal) Collected:  08/20/16 1659    Specimen:  Blood from Blood Updated:  08/20/16 1706     WBC 5.77 x10 3/uL      Hgb 13.0 g/dL      Hematocrit 40.9 %      Platelets 355 x10  3/uL      RBC 4.05 (L) x10 6/uL      MCV 92.1 fL      MCH 32.1 (H) pg      MCHC 34.9 g/dL      RDW 12 %      MPV 8.3 (L) fL      Neutrophils 54.1 %      Lymphocytes Automated 33.6 %      Monocytes 8.8 %      Eosinophils Automated 2.8 %      Basophils Automated 0.5 %      Immature Granulocyte 0.2 %      Nucleated RBC 0.0 /100 WBC      Neutrophils Absolute 3.12 x10 3/uL      Abs Lymph Automated 1.94 x10 3/uL      Abs Mono Automated 0.51 x10 3/uL      Abs Eos Automated 0.16 x10 3/uL      Absolute Baso Automated 0.03 x10 3/uL      Absolute Immature Granulocyte 0.01 x10 3/uL      Absolute NRBC 0.00 x10 3/uL     Urine BHCG POC [[id number]]  (Abnormal) Collected:  08/20/16 1645     Updated:  08/20/16 1651     Urine bHCG POC Positive (A)          Radiologic Studies -   Radiology Results (24 Hour)     Procedure Component Value Units Date/Time    US OB Transvaginal Only [[id number]] Collected:  08/20/16 1842    Order Status:  Completed Updated:  08/20/16 1847    Narrative:       HISTORY: Pelvic pain.    TECHNIQUE: Real-time transvaginal ultrasound of the pelvis was  performed.     PRIORS: 05/14/2015    FINDINGS:     LMP: 08 0-20 17.   There is presence of a single live intrauterine pregnancy of 6 weeks 6  day.   EDC: 03/30/2017.   There is normal yolk sac. There is normal decidual reaction.   There is no evidence of subchorionic hemorrhage.   Fetal heart rate is 110 BPM.   The cervix is closed and measures 3.3 cm.   The right and left ovary measure respectively 4 x 2 cm and 3 x 2 cm.  There is complex 3 cm right ovarian cyst.   There is small amount of pelvic free fluid within the cul-de-sac.        Impression:        Single live intrauterine pregnancy. Small amount of pelvic  free fluid.    Georgana Curio, MD   08/20/2016 6:43 PM        .      Medical Decision Making   I am the first provider for this patient.    I reviewed the vital signs, available nursing notes, past medical history, past surgical history, family history  and social history.    Vital Signs-Reviewed the patient's vital  signs.     Vitals:    08/20/16 1607 08/20/16 1848   BP: 133/80 122/64   Pulse: 87 76   Resp: 16 16   Temp: 97.9 F (36.6 C) 98 F (36.7 C)   TempSrc: Oral    SpO2: 100% 100%   Weight: 68 kg    Height: 5\' 4"  (1.626 m)        Pulse Oximetry Analysis - Normal 100% on RA    Old Medical Records: Nursing notes.     Clinical Course/Medical Decision Making:   31 F with h/o ectopic 10 years ago presenting with 2 days of nausea, abd pain, diarrhea. No vaginal bleeding. Pain is crampy, generalized, L>R. +Household contacts with similar GI sxs. Found to be pregnant on UPT (now G2P0A1), U/S reveals IUP at [redacted]w[redacted]d.      Given that abd pain is nonfocal and affecting generalized abdomen, suspecting this is related to infectious gastroenteritis rather than early pregnancy. No evidence of ectopic on U/S, but advised on possibility of heterotopic pregnancy and continued monitoring of sxs. Strict return precautions given. F/u with OB in 1-2 days. Encouraged po hydration, prn phenergan and tylenol at home for sxs. All questions were answered and all concerns were addressed. Pt is stable, agrees with plan, and is amenable to discharge.      Case d/w Dr. Burgess Estelle    Diagnosis     Clinical Impression:   1. Gastroenteritis    2. Incidental pregnancy        _______________________________    Attestations:  Thea Gist, PA-C, am the primary clinician of record.    Signed by: Sondra Barges, PA-C    _______________________________       Orlene Erm, PA  08/22/16 1350

## 2016-08-20 NOTE — Discharge Instructions (Signed)
Abdominal Pain    You have been diagnosed with abdominal (belly) pain. The cause of your pain is not yet known.    Many things can cause abdominal pain. Examples include viral infections and bowel (intestine) spasms. You might need another examination or more tests to find out why you have pain.    At this time, your pain does not seem to be caused by anything dangerous. You do not need surgery. You do not need to stay in the hospital.     Though we don't believe your condition is dangerous right now, it is important to be careful. Sometimes a problem that seems mild can become serious later. This is why it is very important that you return here or go to the nearest Emergency Department unless you are 100% improved.    Return here or go to the nearest Emergency Department, or follow up with your physician in:   24 hours.    Drink only clear liquids such as water, clear broth, sports drinks, or clear caffeine-free soft drinks, like 7-Up or Sprite, for the next:   24 hours.    YOU SHOULD SEEK MEDICAL ATTENTION IMMEDIATELY, EITHER HERE OR AT THE NEAREST EMERGENCY DEPARTMENT, IF ANY OF THE FOLLOWING OCCURS:   Your pain does not go away or gets worse.   You cannot keep fluids down or your vomit is dark green.    You vomit blood or see blood in your stool. Blood might be bright red or dark red. It can also be black and look like tar.   You have a fever (temperature higher than 100.62F / 38C) or shaking chills.   Your skin or eyes look yellow or your urine looks brown.   You have severe diarrhea.      Pregnancy, IUP Confirmed    It has been determined you are pregnant.    You have an intra-uterine pregnancy. This is also called an "IUP." This means the fetus (baby) is growing inside the uterus. This is normal. Some fetuses grow inside the fallopian tubes. This is called an ectopic pregnancy and is very dangerous.    Your doctor determined you have an IUP by examining you, doing lab tests and viewing an  ultrasound of your pelvis.    There is an extremely small chance that you could have an IUP AND a tubal pregnancy at the same time. This happens when a fetus grows in the uterus and a second fetus grows in a fallopian tube. This is called a "heterotopic pregnancy." About 1 in every 30,000 pregnancies is heterotopic. This condition is EXTREMELY RARE. However, it can be life-threatening.    At this time, there are no signs of an ectopic (or heterotopic) pregnancy. Your doctor believes it is OK for you to go home.    Follow up with an obstetrician who can care for you during your pregnancy. If you do not already have an obstetrician, the medical staff here can help you find one.    See an obstetrician as soon as possible. This needs to be within the next few weeks. Tell your regular doctor about this visit and your pregnancy.    Avoid most medicines while pregnant unless the doctor says it is okay. It is safe to use acetaminophen (Tylenol). However, be sure to follow the directions on the package.    Some things you can do to keep your baby healthy are:   If you smoke, STOP! Smoking can hurt your fetus (baby) and  you. Smoking also makes miscarriage (losing the baby) more likely.   Alcohol or drug abuse will hurt the fetus (developing baby). Some risks are abnormal development, premature birth, serious health and mental problems once the child is born and miscarriage. Do not use alcohol or other drugs until after your baby is born.   If you are taking medicines, check with your regular doctor right away. This includes: Antipsychotics, antidepressants or seizure medicine. Your doctor can tell you if it is safe to keep taking these drugs while pregnant.   Stay well-hydrated at all times. Do this by drinking plenty of water. Eat a balanced, healthy, nutritious diet.   Take prenatal vitamins every day. These may be prescribed for you on this visit. You may need to get them from the follow-up care provider. You  can also get less expensive kinds over the counter (without a prescription). Ask you pharmacist for details.    If symptoms that make you believe you may have pregnancy complications develop, you may need to return here or to your Obstetrician.    YOU SHOULD SEEK MEDICAL ATTENTION IMMEDIATELY, EITHER HERE OR AT THE NEAREST EMERGENCY DEPARTMENT, IF ANY OF THE FOLLOWING OCCURS:   Nausea (sick to your stomach), vomiting (throwing up), fever (temperature higher than 100.61F / 38C) or chills.   More pain in the abdomen (belly), pelvis or back.   Passing large clots or fetal tissue, heavy vaginal bleeding that soaks a pad or tampon all the way through (more than one pad an hour).   Feeling dizzy, lightheaded or passing out.

## 2016-08-20 NOTE — ED Triage Notes (Signed)
Patient presents to the Ed with lower abdominal pain and nausea. Patient states LMP 08/14.

## 2016-09-24 DIAGNOSIS — Z6791 Unspecified blood type, Rh negative: Secondary | ICD-10-CM | POA: Insufficient documentation

## 2016-11-20 ENCOUNTER — Encounter (FREE_STANDING_LABORATORY_FACILITY): Payer: No Typology Code available for payment source

## 2016-11-20 DIAGNOSIS — Z3402 Encounter for supervision of normal first pregnancy, second trimester: Secondary | ICD-10-CM

## 2016-11-20 LAB — CBC AND DIFFERENTIAL
Absolute NRBC: 0 10*3/uL
Basophils Absolute Automated: 0.01 10*3/uL (ref 0.00–0.20)
Basophils Automated: 0.2 %
Eosinophils Absolute Automated: 0.09 10*3/uL (ref 0.00–0.70)
Eosinophils Automated: 1.4 %
Hematocrit: 34.9 % — ABNORMAL LOW (ref 37.0–47.0)
Hgb: 11.5 g/dL — ABNORMAL LOW (ref 12.0–16.0)
Immature Granulocytes Absolute: 0.03 10*3/uL
Immature Granulocytes: 0.5 %
Lymphocytes Absolute Automated: 1.31 10*3/uL (ref 0.50–4.40)
Lymphocytes Automated: 19.7 %
MCH: 31.3 pg (ref 28.0–32.0)
MCHC: 33 g/dL (ref 32.0–36.0)
MCV: 94.8 fL (ref 80.0–100.0)
MPV: 9 fL — ABNORMAL LOW (ref 9.4–12.3)
Monocytes Absolute Automated: 0.58 10*3/uL (ref 0.00–1.20)
Monocytes: 8.7 %
Neutrophils Absolute: 4.64 10*3/uL (ref 1.80–8.10)
Neutrophils: 69.5 %
Nucleated RBC: 0 /100 WBC (ref 0.0–1.0)
Platelets: 355 10*3/uL (ref 140–400)
RBC: 3.68 10*6/uL — ABNORMAL LOW (ref 4.20–5.40)
RDW: 12 % (ref 12–15)
WBC: 6.66 10*3/uL (ref 3.50–10.80)

## 2016-11-20 LAB — URINALYSIS WITH MICROSCOPIC
Bilirubin, UA: NEGATIVE
Blood, UA: NEGATIVE
Glucose, UA: NEGATIVE
Ketones UA: NEGATIVE
Leukocyte Esterase, UA: NEGATIVE
Nitrite, UA: NEGATIVE
Protein, UR: NEGATIVE
Specific Gravity UA: 1.026 (ref 1.001–1.035)
Urine pH: 7 (ref 5.0–8.0)
Urobilinogen, UA: 0.2

## 2016-11-20 LAB — HIV AG/AB 4TH GENERATION: HIV Ag/Ab, 4th Generation: NONREACTIVE

## 2016-11-20 LAB — HEPATITIS B SURFACE ANTIGEN W/ REFLEX TO CONFIRMATION: Hepatitis B Surface Antigen: NEGATIVE

## 2016-11-20 LAB — RPR: RPR: NONREACTIVE

## 2016-11-20 LAB — RUBELLA ANTIBODY, IGG: Rubella AB, IgG: IMMUNE

## 2016-11-20 LAB — GLUCOSE CHALLENGE: Glucose Challenge: 77 mg/dL

## 2016-11-21 LAB — LEAD, BLOOD: Lead: <1 (ref <5)

## 2016-11-21 LAB — RPR (REFLEX TO TITER AND CONFIRMATION): RPR: NONREACTIVE

## 2016-11-24 ENCOUNTER — Emergency Department: Payer: No Typology Code available for payment source

## 2016-11-24 ENCOUNTER — Emergency Department
Admission: EM | Admit: 2016-11-24 | Discharge: 2016-11-24 | Disposition: A | Discharge status: Home / Self Care | Payer: No Typology Code available for payment source | Attending: Emergency Medicine | Admitting: Emergency Medicine

## 2016-11-24 DIAGNOSIS — O99612 Diseases of the digestive system complicating pregnancy, second trimester: Secondary | ICD-10-CM | POA: Insufficient documentation

## 2016-11-24 DIAGNOSIS — Z3A20 20 weeks gestation of pregnancy: Secondary | ICD-10-CM | POA: Insufficient documentation

## 2016-11-24 DIAGNOSIS — R102 Pelvic and perineal pain: Secondary | ICD-10-CM

## 2016-11-24 DIAGNOSIS — O26892 Other specified pregnancy related conditions, second trimester: Secondary | ICD-10-CM | POA: Insufficient documentation

## 2016-11-24 DIAGNOSIS — K2940 Chronic atrophic gastritis without bleeding: Secondary | ICD-10-CM

## 2016-11-24 LAB — QUAD SCREEN M
Age Risk Down Syndrome: 1:880 {titer}
Estriol, MoM: 1.44
Estriol, Unconjugated: 2.56 ng/mL
Gestational Age:: 20.1
Inhibin MOM: 1.33
Inhibin: 225 pg/mL
MSAFP MOM: 1.06
MSAFP: 59.4 ng/mL
MSS Down Syndrome Risk: 1:4200 {titer}
MSS Trisomy 18 Risk: <1:5000 {titer}
Number of Fetuses:: 1
Risk for ONTD: 1:3000 {titer}
WGHT: 162
hCG MOM: 1.44
hCG: 30.2

## 2016-11-24 LAB — URINALYSIS, REFLEX TO MICROSCOPIC EXAM IF INDICATED
Bilirubin, UA: NEGATIVE
Blood, UA: NEGATIVE
Glucose, UA: NEGATIVE
Ketones UA: NEGATIVE
Nitrite, UA: NEGATIVE
Protein, UR: NEGATIVE
Specific Gravity UA: 1.014 (ref 1.001–1.035)
Urine pH: 6 (ref 5.0–8.0)
Urobilinogen, UA: NEGATIVE mg/dL

## 2016-11-24 LAB — CBC AND DIFFERENTIAL
Absolute NRBC: 0 10*3/uL
Basophils Absolute Automated: 0.02 10*3/uL (ref 0.00–0.20)
Basophils Automated: 0.3 %
Eosinophils Absolute Automated: 0.13 10*3/uL (ref 0.00–0.70)
Eosinophils Automated: 1.7 %
Hematocrit: 34.4 % — ABNORMAL LOW (ref 37.0–47.0)
Hgb: 11.9 g/dL — ABNORMAL LOW (ref 12.0–16.0)
Immature Granulocytes Absolute: 0.04 10*3/uL
Immature Granulocytes: 0.5 %
Lymphocytes Absolute Automated: 1.74 10*3/uL (ref 0.50–4.40)
Lymphocytes Automated: 22.7 %
MCH: 31.9 pg (ref 28.0–32.0)
MCHC: 34.6 g/dL (ref 32.0–36.0)
MCV: 92.2 fL (ref 80.0–100.0)
MPV: 8.9 fL — ABNORMAL LOW (ref 9.4–12.3)
Monocytes Absolute Automated: 0.55 10*3/uL (ref 0.00–1.20)
Monocytes: 7.2 %
Neutrophils Absolute: 5.19 10*3/uL (ref 1.80–8.10)
Neutrophils: 67.6 %
Nucleated RBC: 0 /100 WBC (ref 0.0–1.0)
Platelets: 339 10*3/uL (ref 140–400)
RBC: 3.73 10*6/uL — ABNORMAL LOW (ref 4.20–5.40)
RDW: 12 % (ref 12–15)
WBC: 7.67 10*3/uL (ref 3.50–10.80)

## 2016-11-24 LAB — BASIC METABOLIC PANEL
Anion Gap: 8 (ref 5.0–15.0)
BUN: 7 mg/dL (ref 7.0–19.0)
CO2: 24 mEq/L (ref 22–29)
Calcium: 9 mg/dL (ref 8.5–10.5)
Chloride: 105 mEq/L (ref 100–111)
Creatinine: 0.7 mg/dL (ref 0.6–1.0)
Glucose: 87 mg/dL (ref 70–100)
Potassium: 4.2 mEq/L (ref 3.5–5.1)
Sodium: 137 mEq/L (ref 136–145)

## 2016-11-24 LAB — HCG QUANTITATIVE: hCG, Quant.: 23077.8

## 2016-11-24 LAB — HEMOLYSIS INDEX: Hemolysis Index: 81 — ABNORMAL HIGH (ref 0–18)

## 2016-11-24 LAB — GFR: EGFR: >60

## 2016-11-24 MED ORDER — ACETAMINOPHEN 500 MG PO TABS
1000.0000 mg | ORAL_TABLET | Freq: Once | ORAL | Status: AC
Start: 2016-11-24 — End: 2016-11-24
  Administered 2016-11-24: 1000 mg via ORAL
  Filled 2016-11-24: qty 2

## 2016-11-24 MED ORDER — ALUM & MAG HYDROXIDE-SIMETH 200-200-20 MG/5ML PO SUSP
30.0000 mL | Freq: Four times a day (QID) | ORAL | 0 refills | Status: DC | PRN
Start: 2016-11-24 — End: 2017-03-18

## 2016-11-24 MED ORDER — ALUM & MAG HYDROXIDE-SIMETH 200-200-20 MG/5ML PO SUSP
30.0000 mL | Freq: Once | ORAL | Status: AC
Start: 2016-11-24 — End: 2016-11-24
  Administered 2016-11-24: 30 mL via ORAL
  Filled 2016-11-24: qty 30

## 2016-11-24 NOTE — ED Provider Notes (Signed)
Physician/Midlevel provider first contact with patient: 11/24/16 0410         Date: 11/24/2016  Patient Name: Brandy Jennings    History of Presenting Illness     Chief Complaint   Patient presents with   . Abdominal Pain     preganant       History Provided By: patient    Chief Complaint: lower pelvic pain   Onset: today      Additional History: Brandy Jennings is a 26 y.o. female. Presents to ed with c/o of lower pelvic pain for the past 24 hours. She is [redacted] weeks pregnant. She is not having any vaginal discharge or bleeding. Denies any urinary symptoms. She denies N/V/D. No fever.     PCP: Pcp, Noneorunknown, MD      No current facility-administered medications for this encounter.      Current Outpatient Prescriptions   Medication Sig Dispense Refill   . alum & mag hydroxide-simethicone (MAALOX PLUS) 200-200-20 mg/5 mL suspension Take 30 mLs by mouth every 6 (six) hours as needed for Indigestion (stomach ache). 355 mL 0       Past History     Past Medical History:  History reviewed. No pertinent past medical history.    Past Surgical History:  Past Surgical History:   Procedure Laterality Date   . ECTOPIC PREGNANCY SURGERY         Family History:  History reviewed. No pertinent family history.    Social History:  Social History   Substance Use Topics   . Smoking status: Light Tobacco Smoker   . Smokeless tobacco: Never Used   . Alcohol use No       Allergies:  No Known Allergies    Review of Systems     Review of Systems   Constitutional: Negative.    HENT: Negative.    Eyes: Negative.    Respiratory: Negative.    Cardiovascular: Negative.    Gastrointestinal: Positive for abdominal pain.   Genitourinary: Negative.    Musculoskeletal: Negative.    Skin: Negative.    Neurological: Negative.    Psychiatric/Behavioral: Negative.        Physical Exam   BP 106/61   Pulse 75   Temp 97.9 F (36.6 C) (Oral)   Resp 18   Ht 5\' 4"  (1.626 m)   Wt 73.5 kg   LMP 07/09/2016 (Approximate)   SpO2 97%   BMI 27.81 kg/m      Physical Exam   Constitutional: She is oriented to person, place, and time. She appears well-developed and well-nourished.   HENT:   Head: Normocephalic and atraumatic.   Eyes: EOM are normal. Pupils are equal, round, and reactive to light.   Neck: Normal range of motion.   Cardiovascular: Normal rate and regular rhythm.    Pulmonary/Chest: Effort normal and breath sounds normal.   Abdominal: Soft. Normal appearance. There is tenderness in the suprapubic area. There is no rebound and no guarding.   Musculoskeletal: Normal range of motion.   Neurological: She is alert and oriented to person, place, and time.   Psychiatric: She has a normal mood and affect. Her behavior is normal.   Nursing note and vitals reviewed.          Diagnostic Study Results     Labs -     Results     Procedure Component Value Units Date/Time    hCG, Quantitative [[id number]] Collected:  11/24/16 0444     Updated:  11/24/16 0528     hCG, Quant. 23,077.8    UA, Reflex to Microscopic (pts 3 + yrs) [[id number]]  (Abnormal) Collected:  11/24/16 0444    Specimen:  Urine Updated:  11/24/16 0517     Urine Type Clean Catch     Color, UA Yellow     Clarity, UA Hazy     Specific Gravity UA 1.014     Urine pH 6.0     Leukocyte Esterase, UA Trace (A)     Nitrite, UA Negative     Protein, UR Negative     Glucose, UA Negative     Ketones UA Negative     Urobilinogen, UA Negative mg/dL      Bilirubin, UA Negative     Blood, UA Negative     RBC, UA 3 - 5 /hpf      WBC, UA 0 - 5 /hpf      Squamous Epithelial Cells, Urine 0 - 5 /hpf     Basic Metabolic Panel [[id number]] Collected:  11/24/16 0444    Specimen:  Blood Updated:  11/24/16 0507     Glucose 87 mg/dL      BUN 7.0 mg/dL      Creatinine 0.7 mg/dL      Calcium 9.0 mg/dL      Sodium 782 mEq/L      Potassium 4.2 mEq/L      Chloride 105 mEq/L      CO2 24 mEq/L      Anion Gap 8.0    Hemolysis index [[id number]]  (Abnormal) Collected:  11/24/16 0444     Updated:  11/24/16 0507     Hemolysis Index 81 (H)     GFR [[id number]] Collected:  11/24/16 0444     Updated:  11/24/16 0507     EGFR >60.0    CBC with differential [[id number]]  (Abnormal) Collected:  11/24/16 0444    Specimen:  Blood from Blood Updated:  11/24/16 0455     WBC 7.67 x10 3/uL      Hgb 11.9 (L) g/dL      Hematocrit 01.0 (L) %      Platelets 339 x10 3/uL      RBC 3.73 (L) x10 6/uL      MCV 92.2 fL      MCH 31.9 pg      MCHC 34.6 g/dL      RDW 12 %      MPV 8.9 (L) fL      Neutrophils 67.6 %      Lymphocytes Automated 22.7 %      Monocytes 7.2 %      Eosinophils Automated 1.7 %      Basophils Automated 0.3 %      Immature Granulocyte 0.5 %      Nucleated RBC 0.0 /100 WBC      Neutrophils Absolute 5.19 x10 3/uL      Abs Lymph Automated 1.74 x10 3/uL      Abs Mono Automated 0.55 x10 3/uL      Abs Eos Automated 0.13 x10 3/uL      Absolute Baso Automated 0.02 x10 3/uL      Absolute Immature Granulocyte 0.04 x10 3/uL      Absolute NRBC 0.00 x10 3/uL           Radiologic Studies -   Radiology Results (24 Hour)     Procedure Component Value Units Date/Time    US OB Limited W Transvaginal [[id number]] Collected:  11/24/16 1610    Order Status:  Completed Updated:  11/24/16 9604    Narrative:       History: Pain. LMP not provided.    Comparison: 08/20/2016    Technique: Limited obstetrical ultrasound performed. Survey of fetal  anatomy not performed as this was a limited emergent examination.     Findings:  Single intrauterine gestation in left transverse presentation.   Placenta anterior fundal without evidence of placenta previa.   Amniotic fluid quantity within normal limits.  Cervix closed, 4.1 cm length.   Heart rate 154 bpm.     BPD  4.88 cm, 20 weeks 6 days      HC     17.7 cm, 20 weeks 1 day   AC     16.2 cm, 20 weeks 6 days          FL      3.56 cm, 20 weeks 6 days       Composite post menstrual age by ultrasound 20 weeks 5 days.      Impression:          Single live intrauterine gestation at 20 weeks 5 days.    Adaline Sill, MD   11/24/2016 6:34 AM       .      Medical Decision Making   I am the first provider for this patient.    I reviewed the vital signs, available nursing notes, past medical history, past surgical history, family history and social history.    Vital Signs-Reviewed the patient's vital signs.     Patient Vitals for the past 12 hrs:   BP Temp Pulse Resp   11/24/16 0633 106/61 97.9 F (36.6 C) 75 18   11/24/16 0333 127/76 98.5 F (36.9 C) 81 18         Provider Notes: patient feeling much better. Will follow up with OB-GYN.     Diagnosis     Clinical Impression:   1. Pelvic pain    2. 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    Condition at disposition: Stable          _______________________________    Attestations:  Sindy Guadeloupe, PA-C, am the primary clinician of record.    Signed by: Chancy Hurter, PA-C  _______________________________       Chancy Hurter A, PA  11/24/16 5409       Jethro Bastos, MD  11/25/16 [phone]

## 2016-11-24 NOTE — ED Triage Notes (Signed)
Brandy Jennings is a 26 y.o. female presenting to the ED with c/o constant lower abdominal pain (hx of ectopic pregnancy, patient reports pain to the surgical incision). Patient is currently [redacted] weeks pregnant. Denies vaginal bleeding. BP 127/76   Pulse 81   Temp 98.5 F (36.9 C) (Oral)   Resp 18   Ht 5\' 4"  (1.626 m)   Wt 73.5 kg   LMP 06/23/2016 (Approximate)   SpO2 97%   BMI 27.81 kg/m

## 2016-11-25 ENCOUNTER — Other Ambulatory Visit: Payer: Self-pay | Admitting: Advanced Practice Midwife

## 2016-11-25 DIAGNOSIS — Z3482 Encounter for supervision of other normal pregnancy, second trimester: Secondary | ICD-10-CM

## 2016-11-28 ENCOUNTER — Ambulatory Visit: Payer: No Typology Code available for payment source

## 2016-12-01 ENCOUNTER — Other Ambulatory Visit: Payer: Self-pay | Admitting: Advanced Practice Midwife

## 2016-12-01 ENCOUNTER — Ambulatory Visit
Admission: RE | Admit: 2016-12-01 | Discharge: 2016-12-01 | Disposition: A | Discharge status: Home / Self Care | Payer: No Typology Code available for payment source | Source: Ambulatory Visit | Attending: Advanced Practice Midwife | Admitting: Advanced Practice Midwife

## 2016-12-01 DIAGNOSIS — Z3482 Encounter for supervision of other normal pregnancy, second trimester: Secondary | ICD-10-CM

## 2016-12-01 DIAGNOSIS — Z3A21 21 weeks gestation of pregnancy: Secondary | ICD-10-CM | POA: Insufficient documentation

## 2016-12-01 DIAGNOSIS — Z3689 Encounter for other specified antenatal screening: Secondary | ICD-10-CM | POA: Insufficient documentation

## 2017-01-05 ENCOUNTER — Ambulatory Visit
Admission: RE | Admit: 2017-01-05 | Discharge: 2017-01-05 | Disposition: A | Discharge status: Home / Self Care | Payer: No Typology Code available for payment source | Source: Ambulatory Visit | Attending: Obstetrics & Gynecology | Admitting: Obstetrics & Gynecology

## 2017-01-05 ENCOUNTER — Encounter (FREE_STANDING_LABORATORY_FACILITY): Payer: No Typology Code available for payment source

## 2017-01-05 ENCOUNTER — Encounter (HOSPITAL_BASED_OUTPATIENT_CLINIC_OR_DEPARTMENT_OTHER): Payer: Self-pay

## 2017-01-05 ENCOUNTER — Observation Stay: Payer: No Typology Code available for payment source | Admitting: Specialist

## 2017-01-05 ENCOUNTER — Observation Stay
Admission: AD | Admit: 2017-01-05 | Discharge: 2017-01-05 | Disposition: A | Discharge status: Home / Self Care | Payer: No Typology Code available for payment source | Source: Ambulatory Visit | Attending: Obstetrics & Gynecology | Admitting: Obstetrics & Gynecology

## 2017-01-05 DIAGNOSIS — Z3482 Encounter for supervision of other normal pregnancy, second trimester: Secondary | ICD-10-CM | POA: Insufficient documentation

## 2017-01-05 DIAGNOSIS — O360930 Maternal care for other rhesus isoimmunization, third trimester, not applicable or unspecified: Principal | ICD-10-CM | POA: Insufficient documentation

## 2017-01-05 DIAGNOSIS — Z3A28 28 weeks gestation of pregnancy: Secondary | ICD-10-CM | POA: Insufficient documentation

## 2017-01-05 LAB — PRENATAL  WORKUP
AB Screen Gel: NEGATIVE
ABO Rh: O NEG

## 2017-01-05 LAB — CBC AND DIFFERENTIAL
Absolute NRBC: 0 10*3/uL
Basophils Absolute Automated: 0.02 10*3/uL (ref 0.00–0.20)
Basophils Automated: 0.3 %
Eosinophils Absolute Automated: 0.05 10*3/uL (ref 0.00–0.70)
Eosinophils Automated: 0.7 %
Hematocrit: 36.9 % — ABNORMAL LOW (ref 37.0–47.0)
Hgb: 12.1 g/dL (ref 12.0–16.0)
Immature Granulocytes Absolute: 0.06 10*3/uL — ABNORMAL HIGH
Immature Granulocytes: 0.9 %
Lymphocytes Absolute Automated: 1.27 10*3/uL (ref 0.50–4.40)
Lymphocytes Automated: 18.3 %
MCH: 31.5 pg (ref 28.0–32.0)
MCHC: 32.8 g/dL (ref 32.0–36.0)
MCV: 96.1 fL (ref 80.0–100.0)
MPV: 9.3 fL — ABNORMAL LOW (ref 9.4–12.3)
Monocytes Absolute Automated: 0.5 10*3/uL (ref 0.00–1.20)
Monocytes: 7.2 %
Neutrophils Absolute: 5.05 10*3/uL (ref 1.80–8.10)
Neutrophils: 72.6 %
Nucleated RBC: 0 /100 WBC (ref 0.0–1.0)
Platelets: 358 10*3/uL (ref 140–400)
RBC: 3.84 10*6/uL — ABNORMAL LOW (ref 4.20–5.40)
RDW: 12 % (ref 12–15)
WBC: 6.95 10*3/uL (ref 3.50–10.80)

## 2017-01-05 LAB — RH IMM GLOBULIN

## 2017-01-05 LAB — GLUCOSE CHALLENGE: Glucose Challenge: 127 mg/dL

## 2017-01-05 LAB — TSH: TSH: 0.51 u[IU]/mL (ref 0.35–4.94)

## 2017-01-05 LAB — T3: T3: 1.41 ng/mL (ref 0.48–1.59)

## 2017-01-05 LAB — T4, FREE: T4 Free: 0.94 ng/dL (ref 0.70–1.48)

## 2017-01-05 MED ORDER — RHO D IMMUNE GLOBULIN 1500 UNITS IM SOSY
300.0000 ug | PREFILLED_SYRINGE | Freq: Once | INTRAMUSCULAR | Status: AC
Start: 2017-01-05 — End: 2017-01-05
  Administered 2017-01-05: 300 ug via INTRAMUSCULAR
  Filled 2017-01-05: qty 300

## 2017-01-06 LAB — RPR (REFLEX TO TITER AND CONFIRMATION): RPR: NONREACTIVE

## 2017-01-12 NOTE — Retrospective Coding Query (Signed)
PHYSICIAN'S DOCUMENTATION                                                                      REQUEST                                                                         Date of Request:  01/12/2017  Type of Request:  DOCUMENTATION CLARIFICATION                                         Patient Name: Brandy Jennings, Brandy Jennings  Account #: [id number]  MR #: [id number]  Discharge Date: 01/05/2017      Dear Dr. Lawerance Bach,    The medical record reflects the following :    Patient received Rhogam injection on this DOS.        Question to Physician:    No diagnosis on the order, please document the reason for the Rhogam injection.      PHYSICIAN RESPONSE:    Rh negative. Given for prophylaxis of Rh incompatability                Thank you,  Coder Merwyn Katos  Date 01/12/2017  [phone]

## 2017-03-15 ENCOUNTER — Observation Stay: Payer: No Typology Code available for payment source | Admitting: Pain Medicine

## 2017-03-15 ENCOUNTER — Inpatient Hospital Stay
Admission: RE | Admit: 2017-03-15 | Discharge: 2017-03-18 | DRG: 540 | Disposition: A | Discharge status: Home / Self Care | Payer: No Typology Code available for payment source | Attending: Obstetrics & Gynecology | Admitting: Obstetrics & Gynecology

## 2017-03-15 ENCOUNTER — Encounter
Admission: RE | Disposition: A | Discharge status: Home / Self Care | Payer: Self-pay | Source: Home / Self Care | Attending: Obstetrics & Gynecology

## 2017-03-15 ENCOUNTER — Inpatient Hospital Stay: Payer: No Typology Code available for payment source | Admitting: Obstetrics & Gynecology

## 2017-03-15 DIAGNOSIS — O1490 Unspecified pre-eclampsia, unspecified trimester: Secondary | ICD-10-CM

## 2017-03-15 DIAGNOSIS — Z3A37 37 weeks gestation of pregnancy: Secondary | ICD-10-CM

## 2017-03-15 DIAGNOSIS — I10 Essential (primary) hypertension: Secondary | ICD-10-CM

## 2017-03-15 DIAGNOSIS — O1414 Severe pre-eclampsia complicating childbirth: Principal | ICD-10-CM | POA: Diagnosis present

## 2017-03-15 DIAGNOSIS — Z62810 Personal history of physical and sexual abuse in childhood: Secondary | ICD-10-CM | POA: Diagnosis present

## 2017-03-15 HISTORY — DX: Essential (primary) hypertension: I10

## 2017-03-15 LAB — CBC AND DIFFERENTIAL
Absolute NRBC: 0 10*3/uL
Absolute NRBC: 0 10*3/uL
Basophils Absolute Automated: 0.02 10*3/uL (ref 0.00–0.20)
Basophils Absolute Automated: 0.01 10*3/uL (ref 0.00–0.20)
Basophils Automated: 0.3 %
Basophils Automated: 0.1 %
Eosinophils Absolute Automated: 0.06 10*3/uL (ref 0.00–0.70)
Eosinophils Absolute Automated: 0.01 10*3/uL (ref 0.00–0.70)
Eosinophils Automated: 0.8 %
Eosinophils Automated: 0.1 %
Hematocrit: 35.3 % — ABNORMAL LOW (ref 37.0–47.0)
Hematocrit: 34.9 % — ABNORMAL LOW (ref 37.0–47.0)
Hgb: 12.2 g/dL (ref 12.0–16.0)
Hgb: 12.1 g/dL (ref 12.0–16.0)
Immature Granulocytes Absolute: 0.03 10*3/uL
Immature Granulocytes Absolute: 0.06 10*3/uL — ABNORMAL HIGH
Immature Granulocytes: 0.4 %
Immature Granulocytes: 0.4 %
Lymphocytes Absolute Automated: 1.55 10*3/uL (ref 0.50–4.40)
Lymphocytes Absolute Automated: 0.93 10*3/uL (ref 0.50–4.40)
Lymphocytes Automated: 20.1 %
Lymphocytes Automated: 6.7 %
MCH: 31.4 pg (ref 28.0–32.0)
MCH: 31.6 pg (ref 28.0–32.0)
MCHC: 34.6 g/dL (ref 32.0–36.0)
MCHC: 34.7 g/dL (ref 32.0–36.0)
MCV: 90.7 fL (ref 80.0–100.0)
MCV: 91.1 fL (ref 80.0–100.0)
MPV: 9.4 fL (ref 9.4–12.3)
MPV: 9.4 fL (ref 9.4–12.3)
Monocytes Absolute Automated: 0.68 10*3/uL (ref 0.00–1.20)
Monocytes Absolute Automated: 0.29 10*3/uL (ref 0.00–1.20)
Monocytes: 8.8 %
Monocytes: 2.1 %
Neutrophils Absolute: 5.36 10*3/uL (ref 1.80–8.10)
Neutrophils Absolute: 12.57 10*3/uL — ABNORMAL HIGH (ref 1.80–8.10)
Neutrophils: 69.6 %
Neutrophils: 90.6 %
Nucleated RBC: 0 /100 WBC (ref 0.0–1.0)
Nucleated RBC: 0 /100 WBC (ref 0.0–1.0)
Platelets: 348 10*3/uL (ref 140–400)
Platelets: 372 10*3/uL (ref 140–400)
RBC: 3.89 10*6/uL — ABNORMAL LOW (ref 4.20–5.40)
RBC: 3.83 10*6/uL — ABNORMAL LOW (ref 4.20–5.40)
RDW: 13 % (ref 12–15)
RDW: 13 % (ref 12–15)
WBC: 7.7 10*3/uL (ref 3.50–10.80)
WBC: 13.87 10*3/uL — ABNORMAL HIGH (ref 3.50–10.80)

## 2017-03-15 LAB — URINALYSIS WITH MICROSCOPIC
Bilirubin, UA: NEGATIVE
Blood, UA: NEGATIVE
Glucose, UA: NEGATIVE
Ketones UA: NEGATIVE
Leukocyte Esterase, UA: NEGATIVE
Nitrite, UA: NEGATIVE
Protein, UR: 100 — AB
Specific Gravity UA: 1.016 (ref 1.001–1.035)
Urine pH: 7 (ref 5.0–8.0)
Urobilinogen, UA: NEGATIVE mg/dL

## 2017-03-15 LAB — GFR
EGFR: >60
EGFR: >60

## 2017-03-15 LAB — COMPREHENSIVE METABOLIC PANEL
ALT: 28 U/L (ref 0–55)
AST (SGOT): 24 U/L (ref 5–34)
Albumin/Globulin Ratio: 0.8 — ABNORMAL LOW (ref 0.9–2.2)
Albumin: 2.6 g/dL — ABNORMAL LOW (ref 3.5–5.0)
Alkaline Phosphatase: 222 U/L — ABNORMAL HIGH (ref 37–106)
Anion Gap: 9 (ref 5.0–15.0)
BUN: 10 mg/dL (ref 7.0–19.0)
Bilirubin, Total: 0.2 mg/dL (ref 0.2–1.2)
CO2: 21 mEq/L — ABNORMAL LOW (ref 22–29)
Calcium: 9.3 mg/dL (ref 8.5–10.5)
Chloride: 107 mEq/L (ref 100–111)
Creatinine: 0.8 mg/dL (ref 0.6–1.0)
Globulin: 3.3 g/dL (ref 2.0–3.6)
Glucose: 80 mg/dL (ref 70–100)
Potassium: 4.2 mEq/L (ref 3.5–5.1)
Protein, Total: 5.9 g/dL — ABNORMAL LOW (ref 6.0–8.3)
Sodium: 137 mEq/L (ref 136–145)

## 2017-03-15 LAB — URIC ACID
Uric acid: 5.7 mg/dL (ref 2.6–6.0)
Uric acid: 5.6 mg/dL (ref 2.6–6.0)

## 2017-03-15 LAB — LACTATE DEHYDROGENASE
LDH: 243 U/L (ref 125–331)
LDH: 300 U/L (ref 125–331)

## 2017-03-15 LAB — HEMOLYSIS INDEX
Hemolysis Index: 22 — ABNORMAL HIGH (ref 0–18)
Hemolysis Index: -1 — ABNORMAL LOW (ref 0–18)

## 2017-03-15 LAB — PROTEIN / CREATININE RATIO, URINE
Urine Creatinine, Random: 110.1 mg/dL
Urine Protein Random: 280.5 mg/dL — ABNORMAL HIGH (ref 1.0–14.0)
Urine Protein/Creatinine Ratio: 2.5

## 2017-03-15 LAB — MAGNESIUM: Magnesium: 4.1 mg/dL — ABNORMAL HIGH (ref 1.6–2.6)

## 2017-03-15 LAB — TYPE AND SCREEN
AB Screen Gel: NEGATIVE
ABO Rh: O NEG

## 2017-03-15 LAB — ALT: ALT: 31 U/L (ref 0–55)

## 2017-03-15 LAB — CREATININE, SERUM: Creatinine: 0.7 mg/dL (ref 0.6–1.0)

## 2017-03-15 LAB — AST: AST (SGOT): 31 U/L (ref 5–34)

## 2017-03-15 SURGERY — Surgical Case
Anesthesia: Regional | Wound class: Clean Contaminated

## 2017-03-15 MED ORDER — CALCIUM GLUCONATE 10 % IV SOLN
1.0000 g | INTRAVENOUS | Status: DC | PRN
Start: 2017-03-15 — End: 2017-03-15

## 2017-03-15 MED ORDER — OXYTOCIN-SODIUM CHLORIDE 30-0.9 UT/500ML-% IV SOLN
7.5000 [IU]/h | INTRAVENOUS | Status: AC
Start: 2017-03-15 — End: 2017-03-16
  Administered 2017-03-15: 20:00:00 3 [IU]/h via INTRAVENOUS
  Administered 2017-03-15: 17:00:00 18 [IU]/h via INTRAVENOUS
  Filled 2017-03-15: qty 1000

## 2017-03-15 MED ORDER — LACTATED RINGERS IV SOLN
INTRAVENOUS | Status: DC
Start: 2017-03-15 — End: 2017-03-18

## 2017-03-15 MED ORDER — SIMETHICONE 80 MG PO CHEW
80.0000 mg | CHEWABLE_TABLET | Freq: Four times a day (QID) | ORAL | Status: DC | PRN
Start: 2017-03-15 — End: 2017-03-18

## 2017-03-15 MED ORDER — HYDROMORPHONE HCL 1 MG/ML IJ SOLN
INTRAMUSCULAR | Status: DC | PRN
Start: 2017-03-15 — End: 2017-03-15
  Administered 2017-03-15: 1 mg via INTRAVENOUS

## 2017-03-15 MED ORDER — SUCCINYLCHOLINE CHLORIDE 20 MG/ML IJ SOLN
INTRAMUSCULAR | Status: AC
Start: 2017-03-15 — End: ?
  Filled 2017-03-15: qty 10

## 2017-03-15 MED ORDER — LACTATED RINGERS IV SOLN
INTRAVENOUS | Status: DC
Start: 2017-03-15 — End: 2017-03-16

## 2017-03-15 MED ORDER — FAMOTIDINE 20 MG PO TABS
20.0000 mg | ORAL_TABLET | Freq: Two times a day (BID) | ORAL | Status: DC | PRN
Start: 2017-03-15 — End: 2017-03-18

## 2017-03-15 MED ORDER — CALCIUM GLUCONATE 10 % IV SOLN
1.0000 g | INTRAVENOUS | Status: DC | PRN
Start: 2017-03-15 — End: 2017-03-18

## 2017-03-15 MED ORDER — HYDROMORPHONE HCL 1 MG/ML IJ SOLN
0.5000 mg | INTRAMUSCULAR | Status: DC | PRN
Start: 2017-03-15 — End: 2017-03-18

## 2017-03-15 MED ORDER — DEXAMETHASONE SODIUM PHOSPHATE 4 MG/ML IJ SOLN
INTRAMUSCULAR | Status: AC
Start: 2017-03-15 — End: ?
  Filled 2017-03-15: qty 1

## 2017-03-15 MED ORDER — ONDANSETRON HCL 4 MG/2ML IJ SOLN
4.0000 mg | Freq: Three times a day (TID) | INTRAMUSCULAR | Status: DC | PRN
Start: 2017-03-15 — End: 2017-03-18
  Filled 2017-03-15: qty 2

## 2017-03-15 MED ORDER — HYDROMORPHONE HCL-NACL 20-0.9 MG/100ML-% IV SOLN
INTRAVENOUS | Status: DC
Start: 2017-03-15 — End: 2017-03-16
  Administered 2017-03-15: 19:00:00 20 mg via INTRAVENOUS
  Filled 2017-03-15: qty 100

## 2017-03-15 MED ORDER — SOD CITRATE-CITRIC ACID 500-334 MG/5ML PO SOLN
30.0000 mL | Freq: Once | ORAL | Status: DC | PRN
Start: 2017-03-15 — End: 2017-03-16

## 2017-03-15 MED ORDER — IBUPROFEN 600 MG PO TABS
600.0000 mg | ORAL_TABLET | Freq: Four times a day (QID) | ORAL | Status: DC | PRN
Start: 2017-03-15 — End: 2017-03-15

## 2017-03-15 MED ORDER — MORPHINE SULFATE (PF) 1 MG/ML IJ SOLN
INTRAMUSCULAR | Status: AC
Start: 2017-03-15 — End: ?
  Filled 2017-03-15: qty 10

## 2017-03-15 MED ORDER — SODIUM CHLORIDE 0.9 % IV SOLN
INTRAVENOUS | Status: AC
Start: 2017-03-15 — End: 2017-03-16
  Filled 2017-03-15: qty 100

## 2017-03-15 MED ORDER — PROMETHAZINE HCL 25 MG/ML IJ SOLN
6.2500 mg | Freq: Once | INTRAMUSCULAR | Status: AC | PRN
Start: 2017-03-15 — End: 2017-03-15

## 2017-03-15 MED ORDER — ONDANSETRON HCL 4 MG/2ML IJ SOLN
INTRAMUSCULAR | Status: AC
Start: 2017-03-15 — End: ?
  Filled 2017-03-15: qty 2

## 2017-03-15 MED ORDER — FENTANYL CITRATE (PF) 50 MCG/ML IJ SOLN (WRAP)
25.0000 ug | INTRAMUSCULAR | Status: DC | PRN
Start: 2017-03-15 — End: 2017-03-18
  Filled 2017-03-15: qty 0.5

## 2017-03-15 MED ORDER — MAGNESIUM SULFATE 40 GM/1000 ML BOLUS FROM BAG
6.0000 g | Freq: Once | INTRAMUSCULAR | Status: DC
Start: 2017-03-15 — End: 2017-03-15

## 2017-03-15 MED ORDER — ONDANSETRON HCL 4 MG/2ML IJ SOLN
INTRAMUSCULAR | Status: DC | PRN
Start: 2017-03-15 — End: 2017-03-15
  Administered 2017-03-15: 4 mg via INTRAVENOUS

## 2017-03-15 MED ORDER — ONDANSETRON 4 MG PO TBDP
4.0000 mg | ORAL_TABLET | Freq: Three times a day (TID) | ORAL | Status: DC | PRN
Start: 2017-03-15 — End: 2017-03-18

## 2017-03-15 MED ORDER — HYDROMORPHONE HCL-NACL 20-0.9 MG/100ML-% IV SOLN
INTRAVENOUS | Status: AC
Start: 2017-03-15 — End: ?
  Filled 2017-03-15: qty 100

## 2017-03-15 MED ORDER — NALBUPHINE HCL 10 MG/ML IJ SOLN
5.0000 mg | INTRAMUSCULAR | Status: DC | PRN
Start: 2017-03-15 — End: 2017-03-15

## 2017-03-15 MED ORDER — NALOXONE HCL 0.4 MG/ML IJ SOLN (WRAP)
0.1000 mg | INTRAMUSCULAR | Status: DC | PRN
Start: 2017-03-15 — End: 2017-03-16

## 2017-03-15 MED ORDER — DEXTROSE 5 % IV SOLN
2.0000 g | INTRAVENOUS | Status: DC
Start: 2017-03-15 — End: 2017-03-15

## 2017-03-15 MED ORDER — DIPHENHYDRAMINE HCL 50 MG/ML IJ SOLN
12.5000 mg | Freq: Four times a day (QID) | INTRAMUSCULAR | Status: DC | PRN
Start: 2017-03-15 — End: 2017-03-16

## 2017-03-15 MED ORDER — HYDRALAZINE HCL 20 MG/ML IJ SOLN
INTRAMUSCULAR | Status: AC
Start: 2017-03-15 — End: 2017-03-16
  Filled 2017-03-15: qty 1

## 2017-03-15 MED ORDER — MAGNESIUM SULFATE 40 GM/1000ML IV SOLN
INTRAVENOUS | Status: AC
Start: 2017-03-15 — End: 2017-03-16
  Filled 2017-03-15: qty 1000

## 2017-03-15 MED ORDER — LIDOCAINE-EPINEPHRINE 2 %-1:200000 IJ SOLN
INTRAMUSCULAR | Status: AC
Start: 2017-03-15 — End: ?
  Filled 2017-03-15: qty 20

## 2017-03-15 MED ORDER — CEFAZOLIN SODIUM 1 G IJ SOLR
INTRAMUSCULAR | Status: DC | PRN
Start: 2017-03-15 — End: 2017-03-15
  Administered 2017-03-15: 2 g via INTRAVENOUS

## 2017-03-15 MED ORDER — FENTANYL CITRATE (PF) 50 MCG/ML IJ SOLN (WRAP)
INTRAMUSCULAR | Status: DC | PRN
Start: 2017-03-15 — End: 2017-03-15
  Administered 2017-03-15: 10 ug via INTRATHECAL
  Administered 2017-03-15: 50 ug via INTRAVENOUS

## 2017-03-15 MED ORDER — HYDRALAZINE HCL 20 MG/ML IJ SOLN
5.0000 mg | Freq: Four times a day (QID) | INTRAMUSCULAR | Status: DC | PRN
Start: 2017-03-15 — End: 2017-03-18
  Administered 2017-03-15 – 2017-03-16 (×3): 5 mg via INTRAVENOUS
  Filled 2017-03-15: qty 1

## 2017-03-15 MED ORDER — FAMOTIDINE 10 MG/ML IV SOLN (WRAP)
20.0000 mg | Freq: Two times a day (BID) | INTRAVENOUS | Status: DC | PRN
Start: 2017-03-15 — End: 2017-03-18

## 2017-03-15 MED ORDER — MAGNESIUM SULFATE 40 GM/1000ML IV SOLN
2.0000 g/h | INTRAVENOUS | Status: DC
Start: 2017-03-15 — End: 2017-03-15

## 2017-03-15 MED ORDER — NALBUPHINE HCL 10 MG/ML IJ SOLN
2.5000 mg | INTRAMUSCULAR | Status: DC | PRN
Start: 2017-03-15 — End: 2017-03-16

## 2017-03-15 MED ORDER — DEXAMETHASONE SODIUM PHOSPHATE 4 MG/ML IJ SOLN (WRAP)
INTRAMUSCULAR | Status: DC | PRN
Start: 2017-03-15 — End: 2017-03-15
  Administered 2017-03-15: 4 mg via INTRAVENOUS

## 2017-03-15 MED ORDER — OXYCODONE-ACETAMINOPHEN 5-325 MG PO TABS
1.0000 | ORAL_TABLET | ORAL | Status: DC | PRN
Start: 2017-03-15 — End: 2017-03-15

## 2017-03-15 MED ORDER — ONDANSETRON HCL 4 MG/2ML IJ SOLN
4.0000 mg | Freq: Once | INTRAMUSCULAR | Status: AC | PRN
Start: 2017-03-15 — End: 2017-03-15

## 2017-03-15 MED ORDER — ONDANSETRON HCL 4 MG/2ML IJ SOLN
4.0000 mg | Freq: Once | INTRAMUSCULAR | Status: AC | PRN
Start: 2017-03-15 — End: 2017-03-15
  Administered 2017-03-15: 16:00:00 4 mg via INTRAVENOUS

## 2017-03-15 MED ORDER — MORPHINE SULFATE (PF) 1 MG/ML IJ SOLN
INTRAMUSCULAR | Status: DC | PRN
Start: 2017-03-15 — End: 2017-03-15
  Administered 2017-03-15: .25 mg via INTRATHECAL

## 2017-03-15 MED ORDER — DIPHENHYDRAMINE HCL 50 MG/ML IJ SOLN
12.5000 mg | INTRAMUSCULAR | Status: DC | PRN
Start: 2017-03-15 — End: 2017-03-15

## 2017-03-15 MED ORDER — LACTATED RINGERS IV BOLUS
1000.0000 mL | Freq: Once | INTRAVENOUS | Status: DC
Start: 2017-03-15 — End: 2017-03-16

## 2017-03-15 MED ORDER — LABETALOL HCL 200 MG PO TABS
200.0000 mg | ORAL_TABLET | Freq: Three times a day (TID) | ORAL | Status: DC
Start: 2017-03-16 — End: 2017-03-18
  Administered 2017-03-16 – 2017-03-18 (×8): 200 mg via ORAL
  Filled 2017-03-15 (×8): qty 1

## 2017-03-15 MED ORDER — IBUPROFEN 400 MG PO TABS
400.0000 mg | ORAL_TABLET | Freq: Four times a day (QID) | ORAL | Status: DC | PRN
Start: 2017-03-15 — End: 2017-03-16

## 2017-03-15 MED ORDER — BUPIVACAINE HCL (PF) 0.75 % IJ SOLN
INTRAMUSCULAR | Status: DC | PRN
Start: 2017-03-15 — End: 2017-03-15
  Administered 2017-03-15: 1.8 mL via INTRASPINAL

## 2017-03-15 MED ORDER — SUCCINYLCHOLINE CHLORIDE 20 MG/ML IJ SOLN
INTRAMUSCULAR | Status: DC | PRN
Start: 2017-03-15 — End: 2017-03-15
  Administered 2017-03-15: 120 mg via INTRAVENOUS

## 2017-03-15 MED ORDER — LABETALOL HCL 200 MG PO TABS
200.0000 mg | ORAL_TABLET | Freq: Once | ORAL | Status: AC
Start: 2017-03-15 — End: 2017-03-15
  Administered 2017-03-15: 19:00:00 200 mg via ORAL
  Filled 2017-03-15: qty 1

## 2017-03-15 MED ORDER — PROPOFOL INFUSION 10 MG/ML
INTRAVENOUS | Status: DC | PRN
Start: 2017-03-15 — End: 2017-03-15
  Administered 2017-03-15: 200 mg via INTRAVENOUS

## 2017-03-15 MED ORDER — PROMETHAZINE HCL 25 MG/ML IJ SOLN
6.2500 mg | Freq: Once | INTRAMUSCULAR | Status: DC | PRN
Start: 2017-03-15 — End: 2017-03-18

## 2017-03-15 MED ORDER — NALOXONE HCL 0.4 MG/ML IJ SOLN (WRAP)
0.1000 mg | INTRAMUSCULAR | Status: DC | PRN
Start: 2017-03-15 — End: 2017-03-18

## 2017-03-15 MED ORDER — OXYCODONE-ACETAMINOPHEN 5-325 MG PO TABS
1.0000 | ORAL_TABLET | ORAL | Status: DC | PRN
Start: 2017-03-15 — End: 2017-03-16

## 2017-03-15 MED ORDER — MISOPROSTOL 200 MCG PO TABS
800.0000 ug | ORAL_TABLET | Freq: Once | ORAL | Status: DC | PRN
Start: 2017-03-15 — End: 2017-03-18

## 2017-03-15 MED ORDER — MAGNESIUM SULFATE 40 GM/1000 ML BOLUS FROM BAG
4.0000 g | Freq: Once | INTRAMUSCULAR | Status: AC
Start: 2017-03-15 — End: 2017-03-15
  Administered 2017-03-15: 13:00:00 4 g via INTRAVENOUS

## 2017-03-15 MED ORDER — CEFAZOLIN SODIUM 1 G IJ SOLR
INTRAMUSCULAR | Status: AC
Start: 2017-03-15 — End: 2017-03-15
  Filled 2017-03-15: qty 2000

## 2017-03-15 MED ORDER — MAGNESIUM SULFATE 40 GM/1000ML IV SOLN
2.0000 g/h | INTRAVENOUS | Status: DC
Start: 2017-03-15 — End: 2017-03-18
  Administered 2017-03-15 – 2017-03-16 (×2): 2 g/h via INTRAVENOUS
  Filled 2017-03-15: qty 1000

## 2017-03-15 MED ORDER — HYDROMORPHONE HCL 2 MG/ML IJ SOLN
INTRAMUSCULAR | Status: AC
Start: 2017-03-15 — End: ?
  Filled 2017-03-15: qty 1

## 2017-03-15 MED ORDER — FENTANYL CITRATE (PF) 50 MCG/ML IJ SOLN (WRAP)
INTRAMUSCULAR | Status: AC
Start: 2017-03-15 — End: ?
  Filled 2017-03-15: qty 2

## 2017-03-15 SURGICAL SUPPLY — 22 items
BASIN L (Procedure Accessories) ×2
ELECTRODE ADULT PATIENT RETURN L9 FT REM POLYHESIVE ACRYLIC FOAM (Procedure Accessories) ×1 IMPLANT
ELECTRODE ELECTROSURGICAL BLADE PENCIL L10 FT OD3/8 IN PLUMEPEN ELITE (Cautery) ×1 IMPLANT
ELECTRODE ESURG BLDE PNCL PLUMEPEN ELT (Cautery) ×2
ELECTRODE PATIENT RETURN L9 FT VALLEYLAB (Procedure Accessories) ×1
ELECTRODE PT RTN RM PHSV ACRL FM C30- LB (Procedure Accessories) ×1
GLOVE SURG BIOGEL SZ7.5 (Glove) ×2 IMPLANT
SET BASIN WASH LABOR AND DELIVERY ALEX10479A (Procedure Accessories) ×1 IMPLANT
SLEEVE SEQUEN COMP KNEE REG (Procedure Accessories) ×2 IMPLANT
SOLUTION SRGPRP 74% ISPRP 0.7% IOD (Prep) ×1
SOLUTION SURGICAL PREP 26 ML DURAPREP (Prep) ×1
SOLUTION SURGICAL PREP 26 ML DURAPREP 74% ISOPROPYL ALCOHOL 0.7% (Prep) ×1 IMPLANT
STAPLER SKIN L4.1 MM X W6.5 MM 35 WIDE (Staplers)
STAPLER SKIN L4.1 MM X W6.5 MM 35 WIDE STAPLE CARTRIDGE APPOSE ULC (Staplers) IMPLANT
STAPLER SKN SS PLS APS U 4.1X6.5MM LF 35 (Staplers)
SUTURE ABS 0 CT VCL 36IN BRD COAT VIOL (Suture) ×2
SUTURE COATED VICRYL 0 CT L36 IN BRAID (Suture) ×2 IMPLANT
SUTURE MONOCRYL 0 CT1 36IN (Suture) ×4 IMPLANT
SUTURE MONOCRYL 2-0 CT1 36IN (Suture) ×4 IMPLANT
TRAY C-SECTION PACK (Pack) ×2 IMPLANT
TRAY CATH SSTEP SLOCK BAG 16FR (Tray) ×2 IMPLANT
UNDRGLOV SZ7.5 PI INDICATR BLU (Glove) ×2 IMPLANT

## 2017-03-15 NOTE — Transfer of Care (Addendum)
Anesthesia Transfer of Care Note    Patient: Brandy Jennings    Procedures performed: Procedure(s):  CESAREAN SECTION    Anesthesia type: General ETT    Patient location:PACU    Last vitals:   bp 140/99  Hr 89  Sat 98%  Temp 97.5 F  resp 14      Post pain: Patient not complaining of pain, continue current therapy      Mental Status:sedated    Respiratory Function: tolerating face mask    Cardiovascular: stable    Nausea/Vomiting: patient not complaining of nausea or vomiting    Hydration Status: adequate    Post assessment: no apparent anesthetic complications    Signed by: Alwyn Pea  03/15/17 5:42 PM

## 2017-03-15 NOTE — H&P (Signed)
OBSTETRICS - ANTEPARTUM ADMISSION H&P  [name]  [address] Trudee Jennings and Welton Flakes  03/15/2017 1:18 PM    Brandy Jennings is a 26 y.o.G2P0010 with an Estimated Date of Delivery: 03/30/17 at [redacted]w[redacted]d gestation who is being admitted for evaluation and management of severe pre-eclampsia . The patient notes headache. The patient denies LOF, VB, and UCs. Additionally, she reports good FM. The patient reports The patient reports that she has had her [facility] with the [facility] and that it has been benign. Her records, through the most recent date available, have been located here and have been reviewed by myself.    Past Medical History:   Diagnosis Date   . Headache     migraines   . Hypertension 03/15/2017         Past Surgical History:  Past Surgical History:   Procedure Laterality Date   . ECTOPIC PREGNANCY SURGERY         Family History:  No family history on file.    Social History:  reports that she has been smoking.  She has been smoking about 0.25 packs per day. She has never used smokeless tobacco. She reports that she does not drink alcohol or use drugs.    Past Gynecologic History: None.    OB History   Gravida Para Term Preterm AB Living   2       1     SAB TAB Ectopic Multiple Live Births       1          # Outcome Date GA Lbr Len/2nd Weight Sex Delivery Anes PTL Lv   2 Current            1 Ectopic 2012                Prescriptions Prior to Admission   Medication Sig Dispense Refill Last Dose   . ferrous sulfate 325 (65 FE) MG tablet Take 325 mg by mouth every morning with breakfast.   Past Week at Unknown time   . Prenatal Vit-Fe Fumarate-FA (PRENATAL 1+1 PO) Take by mouth.   03/15/2017 at Unknown time   . alum & mag hydroxide-simethicone (MAALOX PLUS) 200-200-20 mg/5 mL suspension Take 30 mLs by mouth every 6 (six) hours as needed for Indigestion (stomach ache). 355 mL 0 More than a month at Unknown time       Allergies: No Known Allergies    Review of Systems:  A comprehensive review of systems  was:  Negative    Physical Exam:  Patient Vitals for the past 24 hrs:   BP Temp Temp src Pulse Resp Height Weight   03/15/17 1300 140/88 - - (!) 107 - - -   03/15/17 1215 (!) 149/110 - - 81 - - -   03/15/17 1210 (!) 176/97 - - 61 - - -   03/15/17 1205 (!) 0/0 - - - - - -   03/15/17 1200 (!) 199/92 - - (!) 59 - - -   03/15/17 1150 (!) 202/97 - - (!) 57 - - -   03/15/17 1146 - - - - - 1.626 m (5\' 4" ) 70.3 kg (155 lb)   03/15/17 1130 (!) 151/99 98.8 F (37.1 C) Oral 62 18 - -     FHTs: 140, LT and ST variability, no decels, reactive, category 1  UCs: rare on external tocometer, none perceived by patient  Cx: per my exam: 3 1/2 long, closed -3 station, firm consistency,  posterior positon, cephalic.    General: No apparent distress; appears comfortable.  Heart: Regular rate and rhythm without murmur, gallop, or rub.   Lungs: Clear to auscultation bilaterally without wheezes, rhonchi, or rales.   Abdomen: Gravid, NT with appropriate FH. EFW of 6 to 6 1/2 pounds by abdominal palpation. Cephalic by Leoplod's manuevers.  Back: No CVAT, atraumatic, no point tenderness.  Extremities: No cyanosis or erythema. Mild bilateral LE edema. Symmetric. No calf pain 2+ DTRs.  Vulva/Vagina: No lesions, nl. female anatomy, nl. urethral meatus.    Prenatal Care Labs:  Lab Results   Component Value Date    ABORH O NEG 01/05/2017    HEPBSAG Negative 11/20/2016    RPR Non Reactive 01/05/2017    RUBELLAABIGG Immune 11/20/2016     Recent Labs:   Recent Labs  Lab 03/15/17  1119   WBC 7.70   Hgb 12.2   Platelets 348     Results     Procedure Component Value Units Date/Time    Type and screen [[id number]] Collected:  03/15/17 1229    Specimen:  Blood Updated:  03/15/17 1242    Hepatitis B (HBV) Surface Antigen [[id number]] Resulted:  11/20/16     Specimen:  Blood Updated:  03/15/17 1225     Hepatitis B Surface AG Negative    RPR [[id number]] Resulted:  11/20/16     Specimen:  Blood Updated:  03/15/17 1225     RPR Nonreactive    Rubella antibody, IgG  [[id number]] Resulted:  11/20/16     Specimen:  Blood Updated:  03/15/17 1225     Rubella AB, IgG Immune    Protein / creatinine ratio, urine [[id number]]  (Abnormal) Collected:  03/15/17 1119    Specimen:  Urine Updated:  03/15/17 1212     Urine Protein Random 280.5 (H) mg/dL      Urine Creatinine, Random 110.1 mg/dL      Urine Protein/Creatinine Ratio 2.5    Urinalysis with microscopic [[id number]]  (Abnormal) Collected:  03/15/17 1119    Specimen:  Urine Updated:  03/15/17 1152     Urine Type Clean Catch     Color, UA Yellow     Clarity, UA Clear     Specific Gravity UA 1.016     Urine pH 7.0     Leukocyte Esterase, UA Negative     Nitrite, UA Negative     Protein, UR 100 (A)     Glucose, UA Negative     Ketones UA Negative     Urobilinogen, UA Negative mg/dL      Bilirubin, UA Negative     Blood, UA Negative     RBC, UA 0 - 2 /hpf      WBC, UA 0 - 5 /hpf      Squamous Epithelial Cells, Urine 0 - 5 /hpf      Urine Mucus Present    Hemolysis index [[id number]]  (Abnormal) Collected:  03/15/17 1119     Updated:  03/15/17 1147     Hemolysis Index 22 (H)    GFR [[id number]] Collected:  03/15/17 1119     Updated:  03/15/17 1147     EGFR >60.0    Lactate dehydrogenase [[id number]] Collected:  03/15/17 1119    Specimen:  Blood Updated:  03/15/17 1147     LDH 243 U/L     Uric acid [[id number]] Collected:  03/15/17 1119    Specimen:  Blood Updated:  03/15/17 1147     Uric  acid 5.7 mg/dL     Comprehensive metabolic panel [[id number]]  (Abnormal) Collected:  03/15/17 1119    Specimen:  Blood Updated:  03/15/17 1147     Glucose 80 mg/dL      BUN 62.1 mg/dL      Creatinine 0.8 mg/dL      Sodium 308 mEq/L      Potassium 4.2 mEq/L      Chloride 107 mEq/L      CO2 21 (L) mEq/L      Calcium 9.3 mg/dL      Protein, Total 5.9 (L) g/dL      Albumin 2.6 (L) g/dL      AST (SGOT) 24 U/L      ALT 28 U/L      Alkaline Phosphatase 222 (H) U/L      Bilirubin, Total 0.2 mg/dL      Globulin 3.3 g/dL      Albumin/Globulin Ratio 0.8 (L)     Anion Gap  9.0    CBC and differential [[id number]]  (Abnormal) Collected:  03/15/17 1119    Specimen:  Blood from Blood Updated:  03/15/17 1130     WBC 7.70 x10 3/uL      Hgb 12.2 g/dL      Hematocrit 95.2 (L) %      Platelets 348 x10 3/uL      RBC 3.89 (L) x10 6/uL      MCV 90.7 fL      MCH 31.4 pg      MCHC 34.6 g/dL      RDW 13 %      MPV 9.4 fL      Neutrophils 69.6 %      Lymphocytes Automated 20.1 %      Monocytes 8.8 %      Eosinophils Automated 0.8 %      Basophils Automated 0.3 %      Immature Granulocyte 0.4 %      Nucleated RBC 0.0 /100 WBC      Neutrophils Absolute 5.36 x10 3/uL      Abs Lymph Automated 1.55 x10 3/uL      Abs Mono Automated 0.68 x10 3/uL      Abs Eos Automated 0.06 x10 3/uL      Absolute Baso Automated 0.02 x10 3/uL      Absolute Immature Granulocyte 0.03 x10 3/uL      Absolute NRBC 0.00 x10 3/uL           A/P:  1. Term IUP in 26 y.o.G2P0010 at [redacted]w[redacted]d gestation.  2. Admitted for severe preeclampsia-       IV hydralazine given  3. Reassuring fetal surveillance - CEFM/Toco.  4. Comfortable with no UCs and in no need of analgesia at this time.  5. GBS unknown.  6.I have discussed the management of severe preeclampsia and the options for delivery.     The patient is considering options as she is extremely anxious about IOL/Vaginal delivery.  7. Consented for induction of labor and delivery or c-section and the signed consent form that was generated was incorporated into the chart.    Isla Pence, MD
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
## 2017-03-15 NOTE — Progress Notes (Signed)
Anesthesia at bedside; pt to OR for c/s

## 2017-03-15 NOTE — Progress Notes (Signed)
This note also relates to the following rows which could not be included:  Heart Rate - Cannot attach notes to unvalidated device data  BP - Cannot attach notes to unvalidated device data    Dr. Laurine Blazer at bedside discussing POC with pt; pt consents to have c/s; pt reports nausea; will give Zofran as ordered; Per MD, POC is for PP magnesium for 12-24 hours after c/s; pt reports understanding of POC; denies dizziness/blurred vision RUQ pain; BP's reviewed with MD

## 2017-03-15 NOTE — Anesthesia Procedure Notes (Signed)
Spinal      Patient location during procedure: OR  Reason for block: labor      Start time: 03/15/2017 4:18 PM    End time: 03/15/2017 4:31 PM    Staffing  Anesthesiologist: Olin Pia R  Performed: anesthesiologist       Pre-procedure Checklist   Completed: patient identified, surgical consent, pre-op evaluation, timeout performed, risks and benefits discussed, monitors and equipment checked, anesthesia consent given and correct site  Timeout Completed:  03/15/2017 4:18 PM    Spinal  Patient monitoring: pulse oximetry and NIBP    Premedication: Meaningful Contact Maintained    Patient position: sitting    Sterile Technique: chlorhexidine gluconate and isopropyl alcohol, patient draped, mask used and wearing gloves  Skin Local: lidocaine 1%    Successful attempt  Interspace: L3-4    Approach: midline  Number of attempts: 1      Needle Placement  Needle type: Pencan   Needle gauge: 27              Paresthesia Pain: no    Catheter Placement   CSF Return: Yes  Blood Return: No              Assessment   Sensory level: T6  Block Outcome: patient tolerated procedure well, no complications and successful block  Additional Notes  Attempt x 2 with 25 g Whitacre needle but unable to obtain CSF.  Tuohy then used to access the epidural space and 5 inch Pencan 27g inserted through Tuohy, with + CSF free flow.  Spinal medication inserted with good swirl mid-way.      Alwyn Pea  03/15/2017

## 2017-03-15 NOTE — Op Note (Signed)
Cesarean Section Procedure Note    Indications: severe preeclampsia remote from delivery    Pre-operative Diagnosis: [redacted]w[redacted]d, elective    Post-operative Diagnosis: same    Surgeon: Isla Pence     Assistants: Tomasa Rand    Anesthesia: general endotracheal anesthesia    ASA Class: 1    Procedure Details   The risks, benefits, complications, treatment options, and expected outcomes were discussed with the patient.  The patient concurred with the proposed plan, giving informed consent.  The patient was taken to Operating Room with IV running, identified as Brandy Jennings and the procedure verified as C-Section Delivery. A Time Out was held and the above information confirmed.    She was placed in the dorsal supine position with leftward tilt.  After anesthesia was noted to be adequate, the patient was draped and prepped in the usual sterile manner. A Pfannenstiel incision was made  carried down through the subcutaneous tissue to the fascia with knife&bovie cautery.  Fascial incision was made and extended transversely. The rectus muscles were transected off the fascia, identified in the midline and separated.  The peritoneum was identified and entered. Peritoneal incision was extended superiorly and inferiorly with good visualization of the bladder.  The utero-vesical peritoneal was identified, grasped with pick up, entered sharply with metzenbaum scissors and extended laterally.The lower uterine segment was then identified and a low transverse uterine incision was made and extended laterally, bluntly.  Amniotomy of clear fluid was noted.  The head, shoulders and body were then delivered atraumatically.  Delivered from cephalic presentation  After the umbilical cord was clamped and cut the infant was handed off to awaiting pediatrician staff. The placenta was allowed to deliver spontanously and appeared intact and  normal. The uterine outline, tubes and ovaries appeared normal. The uterine incision was then closed in  two layers with running  sutures of 0 Monocryl. Hemostasis was observed. The gutters were cleared of all clot/debris.  Instruments and laps were removed from the abdomen.  The visceral peritoneum was re-approximated with 0 Monocryl.  The Muscle were approximated using 0 Monocryl. The fascia was then re approximated using 0 Vicryl. The subcutaneous space was closed with 3-0 Plain. The Skin was closed with staples.  Instrument, sponge, and needle counts were correct x2.   patient received IV antibiotics prior to skin incision..     Findings:  Normal appearing uterus tubes and ovaries.    Baby: Liveborn Female ; Apgar 1 minute: 8  Apgar 5 minute: 9 ; Birth weight: 5 lb 2.9 oz (2350 g)     Estimated Blood Loss:  500 cc                   Specimens: None  Complications:  none           Disposition: PACU - hemodynamically stable.           Condition: stable    Signed: Isla Pence, MD [phone]

## 2017-03-15 NOTE — Anesthesia Preprocedure Evaluation (Addendum)
Anesthesia Evaluation    AIRWAY    Mallampati: II    TM distance: >3 FB  Neck ROM: full  Mouth Opening:full   CARDIOVASCULAR    cardiovascular exam normal       DENTAL    no notable dental hx     PULMONARY    pulmonary exam normal     OTHER FINDINGS          Smoker    Relevant Problems   No relevant active problems               Anesthesia Plan    ASA 2     spinal               (Risks and benefits of epidural placement discussed with patient including but not limited to infection, bleeding, nerve damage, headache and backache.  Patient states understanding and consents to anesthetic plan.       )      Detailed anesthesia plan: spinal                Plan discussed with CRNA.                   Signed by: Alwyn Pea 03/15/17 3:40 PM

## 2017-03-15 NOTE — Plan of Care (Signed)
Problem: Pain  Goal: Pain at adequate level as identified by patient  Outcome: Progressing   03/15/17 1303   Goal/Interventions addressed this shift   Pain at adequate level as identified by patient Identify patient comfort function goal;Assess for risk of opioid induced respiratory depression, including snoring/sleep apnea. Alert healthcare team of risk factors identified.;Assess pain on admission, during daily assessment and/or before any "as needed" intervention(s);Reassess pain within 30-60 minutes of any procedure/intervention, per Pain Assessment, Intervention, Reassessment (AIR) Cycle;Offer non-pharmacological pain management interventions;Evaluate if patient comfort function goal is met;Evaluate patient's satisfaction with pain management progress;Consult/collaborate with Pain Service;Consult/collaborate with Physical Therapy, Occupational Therapy, and/or Speech Therapy;Include patient/patient care companion in decisions related to pain management as needed       Problem: Side Effects from Pain Analgesia  Goal: Patient will experience minimal side effects of analgesic therapy  Outcome: Progressing   03/15/17 1303   Goal/Interventions addressed this shift   Patient will experience minimal side effects of analgesic therapy Monitor/assess patient's respiratory status (RR depth, effort, breath sounds);Assess for changes in cognitive function;Prevent/manage side effects per LIP orders (i.e. nausea, vomiting, pruritus, constipation, urinary retention, etc.);Evaluate for opioid-induced sedation with appropriate assessment tool (i.e. POSS)       Problem: Discharge Barriers  Goal: Patient will be discharged home or other facility with appropriate resources  Outcome: Progressing   03/15/17 1303   Goal/Interventions addressed this shift   Discharge to home or other facility with appropriate resources Provide appropriate patient education;Provide information on available health resources;Initiate discharge planning        Problem: Psychosocial and Spiritual Needs  Goal: Demonstrates ability to cope with hospitalization/illness  Outcome: Progressing   03/15/17 1303   Goal/Interventions addressed this shift   Demonstrates ability to cope with hospitalizations/illness Encourage verbalization of feelings/concerns/expectations;Provide quiet environment;Assist patient to identify own strengths and abilities;Encourage patient to set small goals for self;Encourage participation in diversional activity;Reinforce positive adaptation of new coping behaviors;Include patient/ patient care companion in decisions;Communicate referral to spiritual care as appropriate       Comments: Pt aware of plan of care and In agreement

## 2017-03-15 NOTE — Plan of Care (Signed)
Problem: Pain  Goal: Pain at adequate level as identified by patient  Outcome: Progressing  Assessing patient pain level at regular intervals to ensure comfort goal is met.

## 2017-03-16 ENCOUNTER — Encounter: Payer: Self-pay | Admitting: Obstetrics & Gynecology

## 2017-03-16 LAB — CBC AND DIFFERENTIAL
Absolute NRBC: 0 10*3/uL
Basophils Absolute Automated: 0.02 10*3/uL (ref 0.00–0.20)
Basophils Automated: 0.1 %
Eosinophils Absolute Automated: 0 10*3/uL (ref 0.00–0.70)
Eosinophils Automated: 0 %
Hematocrit: 35.9 % — ABNORMAL LOW (ref 37.0–47.0)
Hgb: 12.2 g/dL (ref 12.0–16.0)
Immature Granulocytes Absolute: 0.1 10*3/uL — ABNORMAL HIGH
Immature Granulocytes: 0.5 %
Lymphocytes Absolute Automated: 0.8 10*3/uL (ref 0.50–4.40)
Lymphocytes Automated: 4.4 %
MCH: 31.5 pg (ref 28.0–32.0)
MCHC: 34 g/dL (ref 32.0–36.0)
MCV: 92.8 fL (ref 80.0–100.0)
MPV: 9.7 fL (ref 9.4–12.3)
Monocytes Absolute Automated: 0.51 10*3/uL (ref 0.00–1.20)
Monocytes: 2.8 %
Neutrophils Absolute: 16.93 10*3/uL — ABNORMAL HIGH (ref 1.80–8.10)
Neutrophils: 92.2 %
Nucleated RBC: 0 /100 WBC (ref 0.0–1.0)
Platelets: 368 10*3/uL (ref 140–400)
RBC: 3.87 10*6/uL — ABNORMAL LOW (ref 4.20–5.40)
RDW: 13 % (ref 12–15)
WBC: 18.36 10*3/uL — ABNORMAL HIGH (ref 3.50–10.80)

## 2017-03-16 LAB — MAGNESIUM
Magnesium: 5.2 mg/dL (ref 1.6–2.6)
Magnesium: 5.6 mg/dL (ref 1.6–2.6)
Magnesium: 5.6 mg/dL (ref 1.6–2.6)

## 2017-03-16 LAB — URIC ACID
Uric acid: 5.6 mg/dL (ref 2.6–6.0)
Uric acid: 6 mg/dL (ref 2.6–6.0)

## 2017-03-16 LAB — LACTATE DEHYDROGENASE
LDH: 404 U/L — ABNORMAL HIGH (ref 125–331)
LDH: 479 U/L — ABNORMAL HIGH (ref 125–331)

## 2017-03-16 LAB — AST
AST (SGOT): 46 U/L — ABNORMAL HIGH (ref 5–34)
AST (SGOT): 46 U/L — ABNORMAL HIGH (ref 5–34)

## 2017-03-16 LAB — HEMOLYSIS INDEX
Hemolysis Index: 2 (ref 0–18)
Hemolysis Index: -1 — ABNORMAL LOW (ref 0–18)
Hemolysis Index: 5 (ref 0–18)

## 2017-03-16 LAB — ALT
ALT: 40 U/L (ref 0–55)
ALT: 46 U/L (ref 0–55)

## 2017-03-16 LAB — GFR
EGFR: >60
EGFR: >60

## 2017-03-16 LAB — CREATININE, SERUM
Creatinine: 0.7 mg/dL (ref 0.6–1.0)
Creatinine: 0.8 mg/dL (ref 0.6–1.0)

## 2017-03-16 MED ORDER — DIPHENHYDRAMINE HCL 50 MG/ML IJ SOLN
12.5000 mg | INTRAMUSCULAR | Status: DC | PRN
Start: 2017-03-16 — End: 2017-03-18

## 2017-03-16 MED ORDER — IBUPROFEN 600 MG PO TABS
600.0000 mg | ORAL_TABLET | Freq: Four times a day (QID) | ORAL | Status: DC | PRN
Start: 2017-03-16 — End: 2017-03-18
  Administered 2017-03-16 – 2017-03-18 (×9): 600 mg via ORAL
  Filled 2017-03-16 (×9): qty 1

## 2017-03-16 MED ORDER — ONDANSETRON HCL 4 MG/2ML IJ SOLN
4.0000 mg | Freq: Once | INTRAMUSCULAR | Status: DC | PRN
Start: 2017-03-16 — End: 2017-03-18

## 2017-03-16 MED ORDER — PROMETHAZINE HCL 25 MG/ML IJ SOLN
6.2500 mg | Freq: Once | INTRAMUSCULAR | Status: DC | PRN
Start: 2017-03-16 — End: 2017-03-18

## 2017-03-16 MED ORDER — OXYCODONE-ACETAMINOPHEN 5-325 MG PO TABS
1.0000 | ORAL_TABLET | ORAL | Status: DC | PRN
Start: 2017-03-16 — End: 2017-03-18
  Administered 2017-03-16 – 2017-03-18 (×9): 1 via ORAL
  Filled 2017-03-16 (×10): qty 1

## 2017-03-16 MED ORDER — NALBUPHINE HCL 10 MG/ML IJ SOLN
10.0000 mg | INTRAMUSCULAR | Status: DC | PRN
Start: 2017-03-16 — End: 2017-03-18

## 2017-03-16 MED ORDER — NALOXONE HCL 0.4 MG/ML IJ SOLN (WRAP)
0.1000 mg | INTRAMUSCULAR | Status: DC | PRN
Start: 2017-03-16 — End: 2017-03-18

## 2017-03-16 NOTE — Progress Notes (Signed)
CESAREAN SECTION POST-OP  [facility]  [place]  03/16/2017 9:30 AM    Patient without complaint. Normal post-op pain well controlled with PO vs duramorph.   Normal lochia.   No c/o nausea/vomiting/ headache/ visual disturbance.  Urinating normally. Flatus +    Physical Exam:   Temp:  [97.5 F (36.4 C)-98.8 F (37.1 C)] 97.8 F (36.6 C)  Heart Rate:  [57-108] 91  Resp Rate:  [15-18] 16  BP: (0-202)/(0-110) 111/63    Intake/Output Summary (Last 24 hours) at 03/16/17 0930  Last data filed at 03/16/17 0800   Gross per 24 hour   Intake           3287.5 ml   Output             2335 ml   Net            952.5 ml     gen: appears comfortable  heart: regular rate and rhythm  Lungs: CTAB with good air entry throughout. No w/r/r  abd: non-distended, appropriately tender, uterus below umbilicus and firm  incision: dressing clean, dry and intact.  ext: no cyanosis or erythema, postpartum edema, calves pain-free, symmetric and non-tender, sequential compression devices in place.  DTRs bilaterally 2+.    Recent Labs:      Results     Procedure Component Value Units Date/Time    Magnesium (at end of Magnesium Sulfate bolus Q6H) [[id number]]  (Abnormal) Collected:  03/16/17 0714    Specimen:  Blood Updated:  03/16/17 0745     Magnesium 5.6 (H) mg/dL     Hemolysis index [[id number]]  (Abnormal) Collected:  03/16/17 0714     Updated:  03/16/17 0745     Hemolysis Index -1 (L)    GFR [[id number]] Collected:  03/16/17 0153     Updated:  03/16/17 0236     EGFR >60.0    Narrative:       While on Magnesium.    Magnesium (at end of Magnesium Sulfate bolus Q6H) [[id number]]  (Abnormal) Collected:  03/16/17 0153    Specimen:  Blood Updated:  03/16/17 0236     Magnesium 5.2 (H) mg/dL     Narrative:       While on Magnesium.    Hemolysis index [[id number]] Collected:  03/16/17 0153     Updated:  03/16/17 0236     Hemolysis Index 2    Narrative:       While on Magnesium.    Lactate dehydrogenase [[id number]]  (Abnormal)  Collected:  03/16/17 0153    Specimen:  Blood Updated:  03/16/17 0236     LDH 404 (H) U/L     Narrative:       While on Magnesium.    Creatinine, serum [[id number]] Collected:  03/16/17 0153    Specimen:  Blood Updated:  03/16/17 0236     Creatinine 0.7 mg/dL     Narrative:       While on Magnesium.    AST [[id number]]  (Abnormal) Collected:  03/16/17 0153    Specimen:  Blood Updated:  03/16/17 0236     AST (SGOT) 46 (H) U/L     Narrative:       While on Magnesium.    ALT [[id number]] Collected:  03/16/17 0153    Specimen:  Blood Updated:  03/16/17 0236     ALT 40 U/L     Narrative:       While on Magnesium.  Uric acid [[id number]] Collected:  03/16/17 0153    Specimen:  Blood Updated:  03/16/17 0236     Uric acid 5.6 mg/dL     Narrative:       While on Magnesium.    CBC and differential (Q12H while receiving Magnesium drip) [[id number]]  (Abnormal) Collected:  03/16/17 0153    Specimen:  Blood from Blood Updated:  03/16/17 0213     WBC 18.36 (H) x10 3/uL      Hgb 12.2 g/dL      Hematocrit 78.2 (L) %      Platelets 368 x10 3/uL      RBC 3.87 (L) x10 6/uL      MCV 92.8 fL      MCH 31.5 pg      MCHC 34.0 g/dL      RDW 13 %      MPV 9.7 fL      Neutrophils 92.2 %      Lymphocytes Automated 4.4 %      Monocytes 2.8 %      Eosinophils Automated 0.0 %      Basophils Automated 0.1 %      Immature Granulocyte 0.5 %      Nucleated RBC 0.0 /100 WBC      Neutrophils Absolute 16.93 (H) x10 3/uL      Abs Lymph Automated 0.80 x10 3/uL      Abs Mono Automated 0.51 x10 3/uL      Abs Eos Automated 0.00 x10 3/uL      Absolute Baso Automated 0.02 x10 3/uL      Absolute Immature Granulocyte 0.10 (H) x10 3/uL      Absolute NRBC 0.00 x10 3/uL     Narrative:       While on Magnesium.    Lactate dehydrogenase [[id number]] Collected:  03/15/17 1945    Specimen:  Blood Updated:  03/15/17 2016     LDH 300 U/L     Narrative:       While on Magnesium.    Creatinine, serum [[id number]] Collected:  03/15/17 1945    Specimen:  Blood Updated:  03/15/17  2016     Creatinine 0.7 mg/dL     Narrative:       While on Magnesium.    AST [[id number]] Collected:  03/15/17 1945    Specimen:  Blood Updated:  03/15/17 2016     AST (SGOT) 31 U/L     Narrative:       While on Magnesium.    ALT [[id number]] Collected:  03/15/17 1945    Specimen:  Blood Updated:  03/15/17 2016     ALT 31 U/L     Narrative:       While on Magnesium.    Uric acid [[id number]] Collected:  03/15/17 1945    Specimen:  Blood Updated:  03/15/17 2016     Uric acid 5.6 mg/dL     Narrative:       While on Magnesium.    GFR [[id number]] Collected:  03/15/17 1945     Updated:  03/15/17 2016     EGFR >60.0    Narrative:       While on Magnesium.    Magnesium (at end of Magnesium Sulfate bolus Q6H) [[id number]]  (Abnormal) Collected:  03/15/17 1945    Specimen:  Blood Updated:  03/15/17 2016     Magnesium 4.1 (H) mg/dL     Narrative:       While on Magnesium.  Hemolysis index [[id number]]  (Abnormal) Collected:  03/15/17 1945     Updated:  03/15/17 2016     Hemolysis Index -1 (L)    Narrative:       While on Magnesium.    CBC and differential (Q12H while receiving Magnesium drip) [[id number]]  (Abnormal) Collected:  03/15/17 1946    Specimen:  Blood from Blood Updated:  03/15/17 1957     WBC 13.87 (H) x10 3/uL      Hgb 12.1 g/dL      Hematocrit 78.2 (L) %      Platelets 372 x10 3/uL      RBC 3.83 (L) x10 6/uL      MCV 91.1 fL      MCH 31.6 pg      MCHC 34.7 g/dL      RDW 13 %      MPV 9.4 fL      Neutrophils 90.6 %      Lymphocytes Automated 6.7 %      Monocytes 2.1 %      Eosinophils Automated 0.1 %      Basophils Automated 0.1 %      Immature Granulocyte 0.4 %      Nucleated RBC 0.0 /100 WBC      Neutrophils Absolute 12.57 (H) x10 3/uL      Abs Lymph Automated 0.93 x10 3/uL      Abs Mono Automated 0.29 x10 3/uL      Abs Eos Automated 0.01 x10 3/uL      Absolute Baso Automated 0.01 x10 3/uL      Absolute Immature Granulocyte 0.06 (H) x10 3/uL      Absolute NRBC 0.00 x10 3/uL     Narrative:       While on Magnesium.     Type and screen [[id number]] Collected:  03/15/17 1229    Specimen:  Blood Updated:  03/15/17 1353     ABO Rh O NEG     AB Screen Gel NEG    Hepatitis B (HBV) Surface Antigen [[id number]] Resulted:  11/20/16     Specimen:  Blood Updated:  03/15/17 1225     Hepatitis B Surface AG Negative    RPR [[id number]] Resulted:  11/20/16     Specimen:  Blood Updated:  03/15/17 1225     RPR Nonreactive    Rubella antibody, IgG [[id number]] Resulted:  11/20/16     Specimen:  Blood Updated:  03/15/17 1225     Rubella AB, IgG Immune    Protein / creatinine ratio, urine [[id number]]  (Abnormal) Collected:  03/15/17 1119    Specimen:  Urine Updated:  03/15/17 1212     Urine Protein Random 280.5 (H) mg/dL      Urine Creatinine, Random 110.1 mg/dL      Urine Protein/Creatinine Ratio 2.5    Urinalysis with microscopic [[id number]]  (Abnormal) Collected:  03/15/17 1119    Specimen:  Urine Updated:  03/15/17 1152     Urine Type Clean Catch     Color, UA Yellow     Clarity, UA Clear     Specific Gravity UA 1.016     Urine pH 7.0     Leukocyte Esterase, UA Negative     Nitrite, UA Negative     Protein, UR 100 (A)     Glucose, UA Negative     Ketones UA Negative     Urobilinogen, UA Negative mg/dL      Bilirubin, UA Negative  Blood, UA Negative     RBC, UA 0 - 2 /hpf      WBC, UA 0 - 5 /hpf      Squamous Epithelial Cells, Urine 0 - 5 /hpf      Urine Mucus Present    Hemolysis index [[id number]]  (Abnormal) Collected:  03/15/17 1119     Updated:  03/15/17 1147     Hemolysis Index 22 (H)    GFR [[id number]] Collected:  03/15/17 1119     Updated:  03/15/17 1147     EGFR >60.0    Lactate dehydrogenase [[id number]] Collected:  03/15/17 1119    Specimen:  Blood Updated:  03/15/17 1147     LDH 243 U/L     Uric acid [[id number]] Collected:  03/15/17 1119    Specimen:  Blood Updated:  03/15/17 1147     Uric acid 5.7 mg/dL     Comprehensive metabolic panel [[id number]]  (Abnormal) Collected:  03/15/17 1119    Specimen:  Blood Updated:  03/15/17 1147      Glucose 80 mg/dL      BUN 31.4 mg/dL      Creatinine 0.8 mg/dL      Sodium 970 mEq/L      Potassium 4.2 mEq/L      Chloride 107 mEq/L      CO2 21 (L) mEq/L      Calcium 9.3 mg/dL      Protein, Total 5.9 (L) g/dL      Albumin 2.6 (L) g/dL      AST (SGOT) 24 U/L      ALT 28 U/L      Alkaline Phosphatase 222 (H) U/L      Bilirubin, Total 0.2 mg/dL      Globulin 3.3 g/dL      Albumin/Globulin Ratio 0.8 (L)     Anion Gap 9.0    CBC and differential [[id number]]  (Abnormal) Collected:  03/15/17 1119    Specimen:  Blood from Blood Updated:  03/15/17 1130     WBC 7.70 x10 3/uL      Hgb 12.2 g/dL      Hematocrit 02.7 (L) %      Platelets 348 x10 3/uL      RBC 3.89 (L) x10 6/uL      MCV 90.7 fL      MCH 31.4 pg      MCHC 34.6 g/dL      RDW 13 %      MPV 9.4 fL      Neutrophils 69.6 %      Lymphocytes Automated 20.1 %      Monocytes 8.8 %      Eosinophils Automated 0.8 %      Basophils Automated 0.3 %      Immature Granulocyte 0.4 %      Nucleated RBC 0.0 /100 WBC      Neutrophils Absolute 5.36 x10 3/uL      Abs Lymph Automated 1.55 x10 3/uL      Abs Mono Automated 0.68 x10 3/uL      Abs Eos Automated 0.06 x10 3/uL      Absolute Baso Automated 0.02 x10 3/uL      Absolute Immature Granulocyte 0.03 x10 3/uL      Absolute NRBC 0.00 x10 3/uL          Assessment and Plan:   1. POD #1 s/p C/S - doing well, on magnesium till 24 hrs postpartum  2. Continue routine post-op care,  tolerating PO  3. On anti HTN meds: 200 mg labetalol Q 8 hrly    Marla Roe, MD

## 2017-03-16 NOTE — Plan of Care (Signed)
Problem: Vaginal/Cesarean Delivery  Goal: Incision will be clean, dry, and intact and without discharge or hematoma  Outcome: Progressing   03/16/17 1830   Goal/Interventions addressed this shift   Incision will be clean, dry and intact and without discharge or hematoma Assess abdominal dressing and incision;Monitor incision for signs of infections;Assess incision for bleeding/hematoma;Assess incision for evidence of healing       Comments: Pt stable at this time, no complaints of headaches, visual disturbances or dizziness. Uterus firm with scant lochia, no clots. Encouraged to increase oral fluid intake, hand expression and breastfeeding; instructed to call if needing assistance. Reinforced the need to call for assist x2 once with the urge void to be instructed on pericare and periaids use. POC discussed and pt verbalized understanding. Mother and baby booklet at bedside. Reinforced the need to keep measuring urine output. Questions answered. Will continue to monitor.

## 2017-03-16 NOTE — Addendum Note (Signed)
Addendum  created 03/16/17 0801 by Joya Gaskins, MD    Sign clinical note

## 2017-03-16 NOTE — Progress Notes (Signed)
CESAREAN SECTION POST-OP DAY #1    Post op Mag sulfate for preeclampsia    Date Time: 03/16/17 6:17 AM  Patient Name:   Brandy Jennings      Date of Operation:   03/15/2017      Baby: Doreatha Martin @[id number] @     Subjective:     Pt without complaint. Pain well controlled with PO pain meds. Tolerating regular diet, ambulating and voiding without difficulty. Minimal lochia rubra. Breastfeeding.      Objective:   BP 127/72   Pulse 86   Temp 97.8 F (36.6 C) (Oral)   Resp 15   Ht 1.626 m (5\' 4" )   Wt 86.2 kg (190 lb)   LMP 07/09/2016 (Approximate)   Breastfeeding? Yes   BMI 32.61 kg/m       Gen: NAD  Abd: S, NT, ND     FF/NT @U   Inc: c/d/i with Staples  Ext: trace edema, no Homan's      Recent Labs  Lab 03/16/17  0153 03/15/17  1946 03/15/17  1119   WBC 18.36* 13.87* 7.70   Hgb 12.2 12.1 12.2   Hematocrit 35.9* 34.9* 35.3*   Platelets 368 372 348       Assessment/Plan:      A: POD #1 s/p C/S       Preeclampsia    P: mag for 24 hrs       Labetalol 200 mg Q8 hrs PRN  Lactation consult  Routine postop/postpartum care          Signed by: Isla Pence, MD                                                                                                           A: POD #1 s/p C/S    P: ambulate  Lactation consult  Baby for circumcision  Iron supplement  Routine postop/postpartum care

## 2017-03-16 NOTE — Progress Notes (Signed)
Patient blood pressures rising.  0.4mL/5mg  hydralazine given for BP 160/107.  Physician updated.  Will give labetalol when due at 2:40.

## 2017-03-16 NOTE — Anesthesia Postprocedure Evaluation (Signed)
Anesthesia Post Evaluation    Patient: Brandy Jennings    Procedures performed: Procedure(s):  CESAREAN SECTION    Anesthesia type: General ETT and Spinal    Patient location:[facility] Floor    Last vitals:   Vitals:    03/16/17 0700   BP:    Pulse:    Resp: 16   Temp:        Post pain: Patient not complaining of pain, continue current therapy      Mental Status:awake and alert     Respiratory Function: tolerating room air    Cardiovascular: stable    Nausea/Vomiting: patient not complaining of nausea or vomiting    Hydration Status: adequate    Post assessment: no apparent anesthetic complications.  Patient reports that spinal didn't work and she was put to sleep in the OR.  No questions, denies HA, nausea, itching    Signed by: Joya Gaskins, 03/16/2017 8:00 AM

## 2017-03-17 NOTE — Progress Notes (Signed)
OBSTETRICS - CESAREAN SECTION POST-OP  [facility]  Brandy Jennings and Crouse  03/17/2017 7:43 AM    Patient without complaint. Normal post-op pain well controlled with PO pain meds. Normal lochia. Breastfeeding.   Flatus: pos  Bowel movements: neg  Circumcision: desires    Physical Exam:   Temp:  [96.6 F (35.9 C)-98.3 F (36.8 C)] 97 F (36.1 C)  Heart Rate:  [63-91] 63  Resp Rate:  [16-17] 16  BP: (111-153)/(63-91) 153/81    Intake/Output Summary (Last 24 hours) at 03/17/17 0743  Last data filed at 03/17/17 0521   Gross per 24 hour   Intake              750 ml   Output             2940 ml   Net            -2190 ml     Gen: appears comfortable, alert and oriented x 3  Lungs: CTAB  CVS: S1S2+  Abd: non-distended, appropriately tender, uterus below umbilicus and firm, BS+  Incision: healing well w/o erythema or edema. Edges well apposed. No fluctuance or drainage, transverse incision with staples   Lochia: WNL  Ext: no cyanosis or erythema. Normal mild postpartum edema,  calves pain-free, symmetric and nontender    Labs:     Results     Procedure Component Value Units Date/Time    Magnesium (at end of Magnesium Sulfate bolus Q6H) [[id number]]  (Abnormal) Collected:  03/16/17 0153    Specimen:  Blood Updated:  03/16/17 1819     Magnesium 5.2 (HH) mg/dL     Narrative:       While on Magnesium.    Magnesium (at end of Magnesium Sulfate bolus Q6H) [[id number]]  (Abnormal) Collected:  03/16/17 0714    Specimen:  Blood Updated:  03/16/17 1819     Magnesium 5.6 (HH) mg/dL     Magnesium (at end of MGSO4 bolus and Q6H thereafter) [[id number]]  (Abnormal) Collected:  03/16/17 1422    Specimen:  Blood Updated:  03/16/17 1819     Magnesium 5.6 (HH) mg/dL     Narrative:       While on Magnesium.    AST [[id number]]  (Abnormal) Collected:  03/16/17 1422    Specimen:  Blood Updated:  03/16/17 1447     AST (SGOT) 46 (H) U/L     Narrative:       While on Magnesium.    ALT [[id number]] Collected:  03/16/17 1422     Specimen:  Blood Updated:  03/16/17 1447     ALT 46 U/L     Narrative:       While on Magnesium.    Uric acid [[id number]] Collected:  03/16/17 1422    Specimen:  Blood Updated:  03/16/17 1447     Uric acid 6.0 mg/dL     Narrative:       While on Magnesium.    GFR [[id number]] Collected:  03/16/17 1422     Updated:  03/16/17 1447     EGFR >60.0    Narrative:       While on Magnesium.    Hemolysis index [[id number]] Collected:  03/16/17 1422     Updated:  03/16/17 1447     Hemolysis Index 5    Narrative:       While on Magnesium.    Lactate dehydrogenase [[id number]]  (Abnormal) Collected:  03/16/17 1422    Specimen:  Blood Updated:  03/16/17 1447     LDH 479 (H) U/L     Narrative:       While on Magnesium.    Creatinine, serum [[id number]] Collected:  03/16/17 1422    Specimen:  Blood Updated:  03/16/17 1447     Creatinine 0.8 mg/dL     Narrative:       While on Magnesium.    Hemolysis index [[id number]]  (Abnormal) Collected:  03/16/17 0714     Updated:  03/16/17 0745     Hemolysis Index -1 (L)    GFR [[id number]] Collected:  03/16/17 0153     Updated:  03/16/17 0236     EGFR >60.0    Narrative:       While on Magnesium.    Hemolysis index [[id number]] Collected:  03/16/17 0153     Updated:  03/16/17 0236     Hemolysis Index 2    Narrative:       While on Magnesium.    Lactate dehydrogenase [[id number]]  (Abnormal) Collected:  03/16/17 0153    Specimen:  Blood Updated:  03/16/17 0236     LDH 404 (H) U/L     Narrative:       While on Magnesium.    Creatinine, serum [[id number]] Collected:  03/16/17 0153    Specimen:  Blood Updated:  03/16/17 0236     Creatinine 0.7 mg/dL     Narrative:       While on Magnesium.    AST [[id number]]  (Abnormal) Collected:  03/16/17 0153    Specimen:  Blood Updated:  03/16/17 0236     AST (SGOT) 46 (H) U/L     Narrative:       While on Magnesium.    ALT [[id number]] Collected:  03/16/17 0153    Specimen:  Blood Updated:  03/16/17 0236     ALT 40 U/L     Narrative:       While on Magnesium.    Uric  acid [[id number]] Collected:  03/16/17 0153    Specimen:  Blood Updated:  03/16/17 0236     Uric acid 5.6 mg/dL     Narrative:       While on Magnesium.    CBC and differential (Q12H while receiving Magnesium drip) [[id number]]  (Abnormal) Collected:  03/16/17 0153    Specimen:  Blood from Blood Updated:  03/16/17 0213     WBC 18.36 (H) x10 3/uL      Hgb 12.2 g/dL      Hematocrit 06.2 (L) %      Platelets 368 x10 3/uL      RBC 3.87 (L) x10 6/uL      MCV 92.8 fL      MCH 31.5 pg      MCHC 34.0 g/dL      RDW 13 %      MPV 9.7 fL      Neutrophils 92.2 %      Lymphocytes Automated 4.4 %      Monocytes 2.8 %      Eosinophils Automated 0.0 %      Basophils Automated 0.1 %      Immature Granulocyte 0.5 %      Nucleated RBC 0.0 /100 WBC      Neutrophils Absolute 16.93 (H) x10 3/uL      Abs Lymph Automated 0.80 x10 3/uL      Abs Mono Automated 0.51 x10 3/uL      Abs Eos Automated  0.00 x10 3/uL      Absolute Baso Automated 0.02 x10 3/uL      Absolute Immature Granulocyte 0.10 (H) x10 3/uL      Absolute NRBC 0.00 x10 3/uL     Narrative:       While on Magnesium.    Lactate dehydrogenase [[id number]] Collected:  03/15/17 1945    Specimen:  Blood Updated:  03/15/17 2016     LDH 300 U/L     Narrative:       While on Magnesium.    Creatinine, serum [[id number]] Collected:  03/15/17 1945    Specimen:  Blood Updated:  03/15/17 2016     Creatinine 0.7 mg/dL     Narrative:       While on Magnesium.    AST [[id number]] Collected:  03/15/17 1945    Specimen:  Blood Updated:  03/15/17 2016     AST (SGOT) 31 U/L     Narrative:       While on Magnesium.    ALT [[id number]] Collected:  03/15/17 1945    Specimen:  Blood Updated:  03/15/17 2016     ALT 31 U/L     Narrative:       While on Magnesium.    Uric acid [[id number]] Collected:  03/15/17 1945    Specimen:  Blood Updated:  03/15/17 2016     Uric acid 5.6 mg/dL     Narrative:       While on Magnesium.    GFR [[id number]] Collected:  03/15/17 1945     Updated:  03/15/17 2016     EGFR >60.0     Narrative:       While on Magnesium.    Magnesium (at end of Magnesium Sulfate bolus Q6H) [[id number]]  (Abnormal) Collected:  03/15/17 1945    Specimen:  Blood Updated:  03/15/17 2016     Magnesium 4.1 (H) mg/dL     Narrative:       While on Magnesium.    Hemolysis index [[id number]]  (Abnormal) Collected:  03/15/17 1945     Updated:  03/15/17 2016     Hemolysis Index -1 (L)    Narrative:       While on Magnesium.    CBC and differential (Q12H while receiving Magnesium drip) [[id number]]  (Abnormal) Collected:  03/15/17 1946    Specimen:  Blood from Blood Updated:  03/15/17 1957     WBC 13.87 (H) x10 3/uL      Hgb 12.1 g/dL      Hematocrit 18.8 (L) %      Platelets 372 x10 3/uL      RBC 3.83 (L) x10 6/uL      MCV 91.1 fL      MCH 31.6 pg      MCHC 34.7 g/dL      RDW 13 %      MPV 9.4 fL      Neutrophils 90.6 %      Lymphocytes Automated 6.7 %      Monocytes 2.1 %      Eosinophils Automated 0.1 %      Basophils Automated 0.1 %      Immature Granulocyte 0.4 %      Nucleated RBC 0.0 /100 WBC      Neutrophils Absolute 12.57 (H) x10 3/uL      Abs Lymph Automated 0.93 x10 3/uL      Abs Mono Automated 0.29 x10 3/uL      Abs Eos  Automated 0.01 x10 3/uL      Absolute Baso Automated 0.01 x10 3/uL      Absolute Immature Granulocyte 0.06 (H) x10 3/uL      Absolute NRBC 0.00 x10 3/uL     Narrative:       While on Magnesium.    Type and screen [[id number]] Collected:  03/15/17 1229    Specimen:  Blood Updated:  03/15/17 1353     ABO Rh O NEG     AB Screen Gel NEG    Hepatitis B (HBV) Surface Antigen [[id number]] Resulted:  11/20/16     Specimen:  Blood Updated:  03/15/17 1225     Hepatitis B Surface AG Negative    RPR [[id number]] Resulted:  11/20/16     Specimen:  Blood Updated:  03/15/17 1225     RPR Nonreactive    Rubella antibody, IgG [[id number]] Resulted:  11/20/16     Specimen:  Blood Updated:  03/15/17 1225     Rubella AB, IgG Immune    Protein / creatinine ratio, urine [[id number]]  (Abnormal) Collected:  03/15/17 1119     Specimen:  Urine Updated:  03/15/17 1212     Urine Protein Random 280.5 (H) mg/dL      Urine Creatinine, Random 110.1 mg/dL      Urine Protein/Creatinine Ratio 2.5    Urinalysis with microscopic [[id number]]  (Abnormal) Collected:  03/15/17 1119    Specimen:  Urine Updated:  03/15/17 1152     Urine Type Clean Catch     Color, UA Yellow     Clarity, UA Clear     Specific Gravity UA 1.016     Urine pH 7.0     Leukocyte Esterase, UA Negative     Nitrite, UA Negative     Protein, UR 100 (A)     Glucose, UA Negative     Ketones UA Negative     Urobilinogen, UA Negative mg/dL      Bilirubin, UA Negative     Blood, UA Negative     RBC, UA 0 - 2 /hpf      WBC, UA 0 - 5 /hpf      Squamous Epithelial Cells, Urine 0 - 5 /hpf      Urine Mucus Present    Hemolysis index [[id number]]  (Abnormal) Collected:  03/15/17 1119     Updated:  03/15/17 1147     Hemolysis Index 22 (H)    GFR [[id number]] Collected:  03/15/17 1119     Updated:  03/15/17 1147     EGFR >60.0    Lactate dehydrogenase [[id number]] Collected:  03/15/17 1119    Specimen:  Blood Updated:  03/15/17 1147     LDH 243 U/L     Uric acid [[id number]] Collected:  03/15/17 1119    Specimen:  Blood Updated:  03/15/17 1147     Uric acid 5.7 mg/dL     Comprehensive metabolic panel [[id number]]  (Abnormal) Collected:  03/15/17 1119    Specimen:  Blood Updated:  03/15/17 1147     Glucose 80 mg/dL      BUN 26.7 mg/dL      Creatinine 0.8 mg/dL      Sodium 124 mEq/L      Potassium 4.2 mEq/L      Chloride 107 mEq/L      CO2 21 (L) mEq/L      Calcium 9.3 mg/dL      Protein, Total 5.9 (L) g/dL  Albumin 2.6 (L) g/dL      AST (SGOT) 24 U/L      ALT 28 U/L      Alkaline Phosphatase 222 (H) U/L      Bilirubin, Total 0.2 mg/dL      Globulin 3.3 g/dL      Albumin/Globulin Ratio 0.8 (L)     Anion Gap 9.0    CBC and differential [[id number]]  (Abnormal) Collected:  03/15/17 1119    Specimen:  Blood from Blood Updated:  03/15/17 1130     WBC 7.70 x10 3/uL      Hgb 12.2 g/dL      Hematocrit 13.0  (L) %      Platelets 348 x10 3/uL      RBC 3.89 (L) x10 6/uL      MCV 90.7 fL      MCH 31.4 pg      MCHC 34.6 g/dL      RDW 13 %      MPV 9.4 fL      Neutrophils 69.6 %      Lymphocytes Automated 20.1 %      Monocytes 8.8 %      Eosinophils Automated 0.8 %      Basophils Automated 0.3 %      Immature Granulocyte 0.4 %      Nucleated RBC 0.0 /100 WBC      Neutrophils Absolute 5.36 x10 3/uL      Abs Lymph Automated 1.55 x10 3/uL      Abs Mono Automated 0.68 x10 3/uL      Abs Eos Automated 0.06 x10 3/uL      Absolute Baso Automated 0.02 x10 3/uL      Absolute Immature Granulocyte 0.03 x10 3/uL      Absolute NRBC 0.00 x10 3/uL            Assessment and Plan:   1. POD #  2 s/p C/S - doing well.  2. Continue routine post operative care.  3. Discharge to home tomorrow is likely with routine followup, precautions, analgesia and instructions.  4. Continue labetolol 200 mg tid, will titrate based on her BP  5. Follow up with clinic in 2-3 days for staple removal in the clinic.    Marla Roe, MD       Postpartum instructions given; pt to call for symptoms including, but not limited to: heavy bleeding (beyond normal lochia), temp >101, PIH symptoms (headache, visual symptoms, epigastric pain, facial edema), shortness of breath, intractable vomiting, redness/swelling of leg, redness/pain in breasts.    Do not drive for 86-57 days or until you can slam on the brakes without pain. Do not drive after taking Percocet.    Do not lift anything heavier than 15 pounds for 6 weeks.    Do not have sexual intercourse for 6 weeks.    Vaginal bleeding so heavy that you soak more than 2 pad in an hour / Headache not relieved with Tylenol / Change in vision

## 2017-03-17 NOTE — Plan of Care (Signed)
Problem: Discharge Barriers  Goal: Patient will be discharged home or other facility with appropriate resources  Outcome: Progressing  Assessment and VS are WNL, pain management with both motrin and percocet, BP managed with labetalol q8hrs. Baby went to the nursery per mom's request for the whole night until 6am. Progressing towards discharge.

## 2017-03-17 NOTE — Lactation Note (Signed)
Visited in pt's room to provide lactation support.  This is mother's first baby.  She said she would like to breastfeed but baby has been given formula.  Initially baby was formula fed because mother was receiving MgSO4 infusion.  When I entered the room mother was formula feeding infant.  I offered to help baby breastfeed and showed mother how to hold and latch her baby in the c/c position.  Baby nursed about 5 minutes and then fell asleep.   Mother's health history reviewed and mother denied any health concerns relating to breastfeeding.   Educated on feeding cues and need to feed baby on demand at least 8  times every 24 hours.  Educated on benefits of holding baby skin to skin.  Gave instructions for hand expression.  Previously RN initiated breast pumping with pt.  Discussed how milk supply is established.  Recommended mother focus on breastfeeding and cut back or discontinue formula use if her goal is to breastfeed.  I also recommended mother pump for additional breast stimulation but pump at least as often as baby is supplemented. Mother denied questions about pumping and declined help with pumping.  I recommended mother avoid pacifier use prior to baby being about a month old.  Educated on how to know if baby is eating enough by monitoring baby's wt and diaper changes. Gave information on how to get help with breastfeeding after discharge from the hospital.  Educated on paced bottle feeding.   All current questions addressed.  Invited pt to attend the breastfeeding class and encouraged pt to call for breast feeding help or concerns as needed.  Darol Destine RNC-MNN, IBCLC

## 2017-03-17 NOTE — Plan of Care (Signed)
Problem: Vaginal/Cesarean Delivery  Goal: Postpartum management of pain/discomfort   03/17/17 1528   Goal/Interventions addressed this shift   Postpartum management of pain/discomfort Assess pain using a consistent, developmental/age appropriate pain scale;Assess pain level before and following intervention;Include patient/patient care companion in decisions related to pain management;Monitor for post anesthesia issues related to pain management       Comments:  Pt stable at this time, no complaints of headaches, visual disturbances or dizziness.  Pt ambulatory and independent with peri-care, reports controlled pain.  Encouraged to increase oral fluid intake, hand expression and breastfeeding; instructed to call if needing assistance. POC discussed and questions answered. Will continue to monitor.

## 2017-03-18 MED ORDER — LABETALOL HCL 200 MG PO TABS
200.0000 mg | ORAL_TABLET | Freq: Three times a day (TID) | ORAL | 1 refills | Status: DC
Start: 2017-03-18 — End: 2018-03-09

## 2017-03-18 MED ORDER — OXYCODONE-ACETAMINOPHEN 5-325 MG PO TABS
1.0000 | ORAL_TABLET | ORAL | 0 refills | Status: DC | PRN
Start: 2017-03-18 — End: 2018-03-09

## 2017-03-18 MED ORDER — IBUPROFEN 600 MG PO TABS
600.0000 mg | ORAL_TABLET | Freq: Four times a day (QID) | ORAL | 0 refills | Status: DC | PRN
Start: 2017-03-18 — End: 2018-03-09

## 2017-03-18 NOTE — Discharge Summary (Signed)
OBSTETRICS - CESAREAN SECTION DISCHARGE SUMMARY  Antonieta Pert and Srikumar  03/18/2017 7:15 AM    Admission Date: 03/15/2017  Discharge Date: 03/18/2017    Discharge Diagnosis:  1.Term intrauterine pregnancy now delivered via primary cesarean section .    Date of Delivery: 03/15/2017   Time of Delivery: 5:02 PM   Delivered By: Isla Pence   Delivery Type: cesarean section  EDC: Estimated Date of Delivery: 03/30/17  Gestational Age: [redacted]w[redacted]d  Baby: liveborn Female   Apgar 1 minute: 8 Apgar 5 minute: 9   Birth Weight: 5 lb 2.9 oz (2350 g)   Delivery Complications: none    Placenta: , , , , Cord:  Anesthesia:   Feeding Method: Feeding Type: Formula (03/18/17 0440)     Patient Active Problem List   Diagnosis   . Supervision of normal pregnancy       Immunization History   Administered Date(s) Administered   . Rho (D) Immune Globulin Full Dose 01/05/2017       Hospital Course: [name] M Wann is a 26 y.o. female admitted to [facility] on 03/15/2017 and delivered via cesarean section on 03/15/2017 . Her postpartum course was complicated by pre-eclampsia.    At the time of discharge to home, the patient's vital signs are stable and she is afebrile. She is discharged to home in stable condition.    Postpartum Complications: none    Discharge Medications:  Current Discharge Medication List      START taking these medications    Details   ibuprofen (ADVIL,MOTRIN) 600 MG tablet Take 1 tablet (600 mg total) by mouth every 6 (six) hours as needed (pain).  Qty: 30 tablet, Refills: 0      labetalol (NORMODYNE) 200 MG tablet Take 1 tablet (200 mg total) by mouth every 8 (eight) hours.  Qty: 90 tablet, Refills: 1      oxyCODONE-acetaminophen (PERCOCET) 5-325 MG per tablet Take 1 tablet by mouth every 4 (four) hours as needed (pain).  Qty: 30 tablet, Refills: 0         CONTINUE these medications which have NOT CHANGED    Details   ferrous sulfate 325 (65 FE) MG tablet Take 325 mg by mouth every morning with breakfast.       Prenatal Vit-Fe Fumarate-FA (PRENATAL 1+1 PO) Take by mouth.         STOP taking these medications       alum & mag hydroxide-simethicone (MAALOX PLUS) 200-200-20 mg/5 mL suspension            Discharge Followup:  Unless otherwise instructed:  For this cesarean section we recommend that you followup in one (1) week for staple removal here at [facility] on labor and delivery then also in 6 weeks for a postpartum checkup with a physician of your choosing.    Discharge Instructions:  Patient to follow discharge instructions as provided at the time of discharge.    Donald Siva, MD

## 2017-03-18 NOTE — Progress Notes (Signed)
03/18/17 0807   Vital Signs   Temp 97.5 F (36.4 C)   Heart Rate 62   Resp Rate 18   BP 164/89     MD notified of BP. Orders to give 200mg  labetalol now and repeat BP in 30 min

## 2017-03-18 NOTE — Discharge Instr - AVS First Page (Signed)
Reason for your Hospital Admission:  Pre-eclampsia      Instructions for after your discharge:  Postpartum Instruction   For  [facility] Patients    Thank you for choosing [facility] for your medical care needs.   We strive to ALWAYS provide EXCELLENT care to you and your family.    Activity and Exercise:  For the first two weeks at home, you should take care of only yourself and the baby. If you climb stairs, do it slowly. Try to limit it to 2 trips up and down a day for the first week or so. Do not lift anything heavy (over 10 pounds) for the first 2 weeks. You can do any exercise that is comfortable for you after the first two weeks. You may drive a car when you are comfortable sitting. How you feel is the best gauge in deciding how much you should do. If you feel tried, you should rest. Just start slow and build up your activity over time.    Self Care:  You can take showers or tub baths and you can wash your hair. Until your bleeding stops, you should continue to wash your bottom with the squeeze bottle given you in the hospital. If your stitches hurt, sitting in a tub of warm water for half-hour will make them feel better. You may also use Tucks for your hemorrhoids. You should change your pads each time you use the bathroom. DO NOT use tampons until after your return visit to the clinic.    Cesarean Section Mothers:  No Special care of your incision is necessary. Keep it clean with soap and water or with peroxide. No bandage is needed, but you may use one if you desire. You need to return to the clinic in one week after delivery for staple removal. Please call the clinic [phone]) for an appointment. Call the clinic if you have any pain, swelling, redness and/or oozing from your incision or if you have a fever higher than 101 F.    Bleeding:  Expect your bleeding to gradually decrease soon after you are home. It is not unusual to have occasional spotting, but the bleeding should never be  heavier than a normal period. If you are not nursing your baby, your first menstrual periods will return about 6-8 weeks after the baby's birth. If you are nursing your baby, you may have a period for several months. You CAN still get pregnant during this time unless you use some form of birth control. If you choose to have intercourse before your 6-week check-up, use contraceptive foam and condoms.    Afterbirth Pain and Cramping:  You may take TylenolT or ibuprofen (AdvilT) every 4 hours for cramping or afterbirth pains. Use a heating pad or hot water bottle as often as you want for aches and pains. Mild aches and pains should be better in 2-3 days. If you are not better, call us.    Diet:  You can eat or drink anything you like. You should continue to take your prenatal vitamins if you are nursing. If you are not nursing, or if you stop after a few days or weeks, take a multivitamin each day, like CentrumT or Theragran-MT. If you have any questions, you can talk to the nutritionist. Don't forget to get [facility] checks for your baby if you are eligible.    Constipation:  Eating a diet that includes fresh fruit, vegetables, and lots of liquids should help you avoid constipation.  If you do become constipated, you may take warmed prune juice, Milk of MagnesiaT, MetamucilT or a stool softener like ColaceT.    Breast Care:  Breastfeeding: Breast milk is the natural first food for babies and contributes to the health and well-being of mothers. Exclusively breastfeeding for the first 6 months is the optimal way of feeding your infant. If you are breastfeeding your baby, wear a supportive bra. If your nipples become sore or cracked, air-dry them completely after each feeding and change the position of the baby at each feeding. If your breasts become painful and tender, and you have a fever of 101 For greater, call us.  Formula Feeding: IF you are formula feeding your baby or if you stop nursing your baby, your breasts may  require "drying up" treatment. During this time, they will become bigger, tender and uncomfortable for no longer than 36 hours. To decrease the discomfort, wear a tight bra day and night, place ice packs to the breast and DO NOT take hot showers or stimulate or pump your breast in any way, as this only increases the milk supply.    Sexuality:  You should not have sexual intercourse until you have had your 6-week check-up. This will give your stitches time to heal. If you choose to have intercourse before your check-up, remember YOU CAN GET PREGNANT, so use contraceptive foam and condoms. Remember, there are many other ways of showing your love for each other, such as cuddling, close touching or just sharing time with your partner.    The "Blues" :  Being a new mother can be a difficult time, especially if you feel alone or have recently moved. Call the clinic if you have any signs of what we call the "baby blues." These include loss of appetite, being unable to sleep at night, crying episodes that become frequent, or having thoughts that don't seem to make sense.    FOLLOW-UP CARE FOR MOTHER  Remember to call the [facility] [phone]) right away to make an appointment for your 6-week check-up at [facility].  You will need to give a urine specimen at the clinic and have your blood drawn at the time of your appointment.  Please review your birth control options before you come so you can make a decision while you are there.  The doctor or nurse practitioner will answer questions and help you choose a method that day.  If you had medical problems during your pregnancy, you may need to follow-up at the [facility]. The doctor will tell you if you need to make an appointment there.    FOLLOW-UP CARE FOR BABY   Please follow up as directed by the Doctor discharging your baby from the hosptial.             CALL THE DOCTOR AT [phone] IF:   You fill one sanitary pad per hour or more   You have a  temperature above 101 F   Your vaginal discharge smells foul   Your bottom is swollen, reddened or extremely tender   You have painful or frequent urination   Your breasts become very tender, red, or swollen   Your nipples are unusually sore or bleeding    PHONE NUMBERS TO [facility]BER[facility]: [phone]   [facility] (@[address]): [phone]   [facility]: [phone]  .      [facility] Appointment Line: [phone]

## 2017-03-18 NOTE — Consults (Signed)
LATE ENTRY: Writer met with MOB on 5/8 to address consult" assistance with car seat".    MOB informed Clinical research associate that her friend is able to purchase a car seat for her, and will bring it to the hospital on 5/9 AM.  Writer encouraged MOB to inform SW if she is unable to obtain a car seat, as assistance can be provided - MOB assured Clinical research associate her friend will purchase car seat.      This is MOB's first child.  FOB is not involved.  MOB reported having good support form her Mother, siblings and friends.  MOB received prenatal care at [facility], [facility].  MOB denied any hx of mental illness, substance abuse, or trauma/abuse.  She is aware of PPD, writer also provided her with education and information.    MOB reported having all necessary supplies for baby at home including a crib, formula, clothing and diapers - friend to bring car seat to hospital today.    MOB and baby cleared to [place] from SW perspective.    Chinita Greenland, MSW  x: [phone]

## 2017-03-18 NOTE — Plan of Care (Signed)
Pt given discharge instructions. Pt stated she understood. Discharged at this time. Call the office to schedule your follow up appointment.  Information on how to take care of yourself after baby can be found in the New Mother information care booklet.  Please refer to the back page for a list of reason to call your doctor.  If you have any additional questions or concerns please contact your OB.

## 2018-02-23 ENCOUNTER — Other Ambulatory Visit: Payer: Self-pay | Admitting: Obstetrics & Gynecology

## 2018-02-23 DIAGNOSIS — Z369 Encounter for antenatal screening, unspecified: Secondary | ICD-10-CM

## 2018-03-09 ENCOUNTER — Ambulatory Visit (HOSPITAL_BASED_OUTPATIENT_CLINIC_OR_DEPARTMENT_OTHER)
Admission: RE | Admit: 2018-03-09 | Discharge: 2018-03-09 | Disposition: A | Discharge status: Home / Self Care | Payer: No Typology Code available for payment source | Source: Ambulatory Visit | Attending: Obstetrics & Gynecology | Admitting: Obstetrics & Gynecology

## 2018-03-09 ENCOUNTER — Ambulatory Visit
Admission: RE | Admit: 2018-03-09 | Discharge: 2018-03-09 | Disposition: A | Discharge status: Home / Self Care | Payer: No Typology Code available for payment source | Source: Ambulatory Visit | Attending: Obstetrics & Gynecology | Admitting: Obstetrics & Gynecology

## 2018-03-09 ENCOUNTER — Encounter (HOSPITAL_BASED_OUTPATIENT_CLINIC_OR_DEPARTMENT_OTHER): Payer: Self-pay | Admitting: Obstetrics & Gynecology

## 2018-03-09 VITALS — Wt 180.7 lb

## 2018-03-09 DIAGNOSIS — Z369 Encounter for antenatal screening, unspecified: Secondary | ICD-10-CM

## 2018-03-09 DIAGNOSIS — O0933 Supervision of pregnancy with insufficient antenatal care, third trimester: Secondary | ICD-10-CM | POA: Insufficient documentation

## 2018-03-09 DIAGNOSIS — O360930 Maternal care for other rhesus isoimmunization, third trimester, not applicable or unspecified: Secondary | ICD-10-CM

## 2018-03-09 DIAGNOSIS — Z3A33 33 weeks gestation of pregnancy: Secondary | ICD-10-CM | POA: Insufficient documentation

## 2018-03-09 DIAGNOSIS — O10919 Unspecified pre-existing hypertension complicating pregnancy, unspecified trimester: Secondary | ICD-10-CM

## 2018-03-09 DIAGNOSIS — Z6791 Unspecified blood type, Rh negative: Secondary | ICD-10-CM | POA: Insufficient documentation

## 2018-03-09 DIAGNOSIS — O09299 Supervision of pregnancy with other poor reproductive or obstetric history, unspecified trimester: Secondary | ICD-10-CM

## 2018-03-09 DIAGNOSIS — O34219 Maternal care for unspecified type scar from previous cesarean delivery: Secondary | ICD-10-CM

## 2018-03-09 DIAGNOSIS — O09293 Supervision of pregnancy with other poor reproductive or obstetric history, third trimester: Secondary | ICD-10-CM | POA: Insufficient documentation

## 2018-03-09 DIAGNOSIS — Z8759 Personal history of other complications of pregnancy, childbirth and the puerperium: Secondary | ICD-10-CM

## 2018-03-09 DIAGNOSIS — O0930 Supervision of pregnancy with insufficient antenatal care, unspecified trimester: Secondary | ICD-10-CM

## 2018-03-09 DIAGNOSIS — O10913 Unspecified pre-existing hypertension complicating pregnancy, third trimester: Secondary | ICD-10-CM | POA: Insufficient documentation

## 2018-03-09 DIAGNOSIS — Z3689 Encounter for other specified antenatal screening: Secondary | ICD-10-CM | POA: Insufficient documentation

## 2018-03-09 DIAGNOSIS — O09893 Supervision of other high risk pregnancies, third trimester: Secondary | ICD-10-CM

## 2018-03-09 NOTE — Progress Notes (Signed)
NAME: Brandy Jennings  Medical Record Number: [id number]  Date of Birth: 06-23-1991  Date of Service: 03/09/2018       Dear Dr Conley Rolls,    I had the pleasure to meet Brandy Jennings today for a consultation with Maternal-Fetal Medicine secondary to her history of chronic hypertension and history of preeclampsia, at your request.     As you know, she is a 27 y.o. G3P1011, at [redacted]w[redacted]d, with a obstetrical history significant for an ectopic pregnancy requiring left salpingectomy, in 2012. This was followed by her full-term delivery in 2018. Her son was born at [redacted]w[redacted]d gestation, over an indicated C-section for severe preeclampsia. She reports starting prenatal care for this pregnancy very late.    She was diagnosed with chronic hypertension, at the age of 78. She reports being on labetalol for this entire time, including her previous pregnancy, and this current pregnancy. However, I confirmed with the pharmacy that she is actually on Aldomet 250 mg twice daily. She reports her blood pressures are well controlled. She denies any other past medical/surgical/gynecologic histories. Family history significant for diabetes and hypertension. And Brandy Jennings reports no known drug allergies.     The following portions of the patient's history were reviewed and updated as appropriate: allergies, current medications, past family history, past medical history, past social history, past surgical history and problem list.    Today's ultrasound showed an AGA fetus at 33+ week gestation, consistent with the given dating.     Impression:   1- Chronic hypertension  2- History of severe preeclampsia at term   3- History of C-section   4- Rah negative    I counseled Brandy Jennings on the implications of chronic hypertension on pregnancy. Reviewed with her the importance of monitoring her blood pressures at home throughout the pregnancy and continuing her current medications. As long as her hypertension is well controlled, adverse pregnancy  outcomes are significantly low. Chronic hypertension is associated with significantly increased risk of superimposed preeclampsia, especially in the 3rd trimester. For this reason, I do recommend assessing baseline liver and renal function to be used as a basis of comparison should her hypertension become uncontrolled later on in pregnancy. Brandy Jennings reports that a baseline 24hr urine collection was not done. Even at this late gestational age, I do recommend baseline testing.    I also mentioned it to Brandy Jennings that hypertensive disorders are also associated with growth abnormalities. Serial growth ultrasounds are recommended, which would be in 3 more weeks. Given increase incidence of adverse outcomes at increasing gestational age, antenatal testing should be started at around 34 weeks with the recommendation of indicated repeat cesarean delivery at 39 weeks.     Low-dose aspirin therapy has been shown to decrease the risk of preeclampsia. However, it has been shown to be effective when initiated at earlier than 16 weeks. Starting at this late gestational age has not been shown to be effective and I did not endorse it.    I also recommended Rhogam for her given her Rh negative status.    Summary of Recommendations:  1- Return for weekly antenatal testing  2- Continue Aldomet 250 mg twice a day  3- Repeat growth in 3 weeks  4- Obtain Baseline labs (CMP) and 24 urine collection  5- Rhogam now and then postpartum  6- Repeat C-section at 39 weeks      Brandy Jennings expressed good understanding of these recommendations. All of her questions and concerns were addressed.     As always,  thank you for allowing me to take part in the care of your patients. Feel free to contact me should you have any questions from today's consultation.    Sincerely    Zyanne Schumm A. [facility], MD    45 min visit spent on counseling and coordination of care.

## 2018-04-08 ENCOUNTER — Observation Stay
Admission: RE | Admit: 2018-04-08 | Discharge: 2018-04-08 | Disposition: A | Discharge status: Home / Self Care | Payer: No Typology Code available for payment source | Source: Ambulatory Visit | Attending: Obstetrics & Gynecology | Admitting: Obstetrics & Gynecology

## 2018-04-08 ENCOUNTER — Ambulatory Visit: Payer: Self-pay

## 2018-04-08 DIAGNOSIS — O471 False labor at or after 37 completed weeks of gestation: Principal | ICD-10-CM | POA: Insufficient documentation

## 2018-04-08 DIAGNOSIS — Z3A37 37 weeks gestation of pregnancy: Secondary | ICD-10-CM | POA: Insufficient documentation

## 2018-04-08 DIAGNOSIS — Z3490 Encounter for supervision of normal pregnancy, unspecified, unspecified trimester: Secondary | ICD-10-CM

## 2018-04-08 DIAGNOSIS — O163 Unspecified maternal hypertension, third trimester: Secondary | ICD-10-CM | POA: Insufficient documentation

## 2018-04-08 MED ORDER — ACETAMINOPHEN 650 MG RE SUPP
650.0000 mg | RECTAL | Status: DC | PRN
Start: 2018-04-08 — End: 2018-04-08

## 2018-04-08 MED ORDER — ACETAMINOPHEN 325 MG PO TABS
650.0000 mg | ORAL_TABLET | ORAL | Status: DC | PRN
Start: 2018-04-08 — End: 2018-04-08

## 2018-04-08 MED ORDER — ONDANSETRON HCL 4 MG/2ML IJ SOLN
4.0000 mg | Freq: Three times a day (TID) | INTRAMUSCULAR | Status: DC | PRN
Start: 2018-04-08 — End: 2018-04-08

## 2018-04-08 MED ORDER — PROMETHAZINE HCL 12.5 MG PO TABS
12.5000 mg | ORAL_TABLET | Freq: Four times a day (QID) | ORAL | Status: DC | PRN
Start: 2018-04-08 — End: 2018-04-08

## 2018-04-08 MED ORDER — ONDANSETRON 4 MG PO TBDP
4.0000 mg | ORAL_TABLET | Freq: Three times a day (TID) | ORAL | Status: DC | PRN
Start: 2018-04-08 — End: 2018-04-08

## 2018-04-08 MED ORDER — PROMETHAZINE HCL 12.5 MG RE SUPP
12.5000 mg | Freq: Four times a day (QID) | RECTAL | Status: DC | PRN
Start: 2018-04-08 — End: 2018-04-08

## 2018-04-08 MED ORDER — PROMETHAZINE HCL 25 MG/ML IJ SOLN
6.2500 mg | Freq: Four times a day (QID) | INTRAMUSCULAR | Status: DC | PRN
Start: 2018-04-08 — End: 2018-04-08

## 2018-04-08 NOTE — H&P (Signed)
OBSTETRICS CLINICAL DECISION UNIT HISTORY AND PHYSICAL EXAM  [facility]  Antonieta Pert and Srikumar  04/08/2018 11:49 AM    Chief Complaint:   "I am having contractions."    History of Presenting Illness:   [name]-Michele D Murnane is a 27 y.o. G3P1011 at [redacted]w[redacted]d weeks gestation with Estimated Date of Delivery: 04/23/18 who presents to labor and delivery for intermittent abdominal cramping. The patient reports that the cramping is occuring irregularly and has been since early this morning. It is of mild intensity. The patient has no additional complaints and denies VB, DFM, and LOF. The patient reports that she has had benign prenatal care with Dr. Jesse Fall.    Past Gynecologic History: none; no STDs, no PID, no abnormal pap smears    Past Medical History:       Date   . Headache     migraines   . Hypertension 03/15/2017     Past Obstetrical History:      Gravida Para Term Preterm AB Living   3 1 1   1 1    SAB TAB Ectopic Multiple Live Births       1 0 1      # Outcome Date GA Lbr Len/2nd Weight Sex Delivery Anes PTL Lv   3 Current            2 Term 03/15/17 [redacted]w[redacted]d  2.35 kg (5 lb 2.9 oz) M CSECT Spinal, Gen N LIV   1 Ectopic 2012                Past Surgical History:       Laterality Date   . CESAREAN SECTION N/A 03/15/2017    Procedure: CESAREAN SECTION;  Surgeon: Isla Pence, MD;  Location: [facility];  Service: Obstetrics;  Laterality: N/A;   . ECTOPIC PREGNANCY SURGERY       Past Family History:      Problem Relation Age of Onset   . Diabetes Mother    . Hypertension Father      Past Family History: noncontributory    Medications:      No prescriptions prior to admission.     Allergies:   No Known Allergies    Review of Systems:   A extended review of systems was obtained from the patient.  For the following systems the patient reported symptoms: reproductive  For the following systems the patient denied any symptomatology: constitutional symptoms such as fever or weight loss, cardiovascular,  respiratory, gastrointestinal, genitourinary, musculoskeletal, neurological, psychiatric and hematologic/lymphatic    Physical Exam:   Temp:  [98 F (36.7 C)] 98 F (36.7 C)  Heart Rate:  [109] 109  BP: (122)/(82) 122/82    EFM: Baseline FHTs: 140. Variability: moderate. Pattern: accelerations present, decelerations absent. Category: 1. Interpretation: reactive.   UCs: irregular on external tocometer, mild irritability on external tocometer, only slightly perceived by the patient  Cx: per my exam: 3 long, closed, floating, firm consistency, posterior positon, indeterminate presenting part, physiologic discharge of pregnancy, no blood or fluid noted  General: appears comfortable. No apparent distress  Abdomen: gravid, NT with appropriate FH. cephalic by Leopold's manuevers. EFW of 7 1/2 to 8 pounds by abdominal palpation  Neurological: grossly intact  Extremities: no cyanosis or erythema, no LE edema, calves pain-free, symmetric and nontender.  Pelvic Exam: vulva/vagina: no lesions, nl. female anatomy, nl. urethral meatus, physiologic discharge of pregnancy, no blood or fluid noted  Urological: urethral meatus with normal appearance and  urethra normal to palpation    Recent Labs:   None recent; none indicated.    Radiology:     Radiology Results (39 wks)     Procedure Component Value Units Date/Time    MFM EFW [[id number]] Collected:  03/09/18    Order Status:  Completed Updated:  03/09/18 1655    Narrative:       AFI: 12.6    GA: 33.6    EFW:  2169    AC: 29.7,55.0    Placenta: Posterior,    Ultrasound Findings: 1) Single intrauterine pregnancy in vertex presentation.    2) Estimated fetal weight is 2169 grams, at the 40th percentile, within normal   limits. HC IS 1ST PERCENTILE ON TODAY'S EXAM, WITHIN 2ND SD.    3) Amniotic fluid index is 12.6 cm, within normal limits.    4) Limited anatomy survey was performed.                  Indication: NA  Indication 2: NA  Indication 3: NA  Indication 4: NA  Indication 5:  NA  Indication 6: NA  Diagnosis: HTN Chronic - ICD.10  Diagnosis 2: Late Prenatal Care - ICD.10  Diagnosis 3: NA  Diagnosis 4: NA  Diagnosis 5: NA  Diagnosis 6: NA  Procedure: EFW-NA  Procedure 2: NA-NA  Procedure 3: NA-NA  Procedure 4: NA-NA  Procedure 5: NA-NA  Procedure 6: NA-NA  Study Location: [facility]    Gestational Age Analysis: Study Date= 03/09/2018, GA= 33.6, BPD= 7.8, HC= 28.8, AC= 29.7    Fetal Growth and Ratio Analysis: Study Date= 03/09/2018, AFI= 12.6, FW=  2169      Impression:       NAME: Glade Stanford Hazell  Medical Record Number: [id number]    Date of Birth: 03-30-1991  Date of Service: 03/09/2018       Dear Dr Conley Rolls,  I had   the pleasure to meet [name]-Michele D Cradle today for a consultation with   Maternal-Fetal Medicine secondary to her history of chronic hypertension and   history of preeclampsia, at your request.   As you know, she is a 27 y.o.   G3P1011, at [redacted]w[redacted]d, with a obstetrical history significant for an ectopic   pregnancy requiring left salpingectomy, in 2012. This was followed by her   full-term delivery in 2018. Her son was born at [redacted]w[redacted]d gestation, over an   indicated C-section for severe preeclampsia. She reports starting prenatal care   for this pregnancy very late.    She was diagnosed with chronic hypertension,   at the age of 69. She reports being on labetalol for this entire time,   including her previous pregnancy, and this current pregnancy. However, I   confirmed with the pharmacy that she is actually on Aldomet 250 mg twice daily.   She reports her blood pressures are well controlled. She denies any other past   medical/surgical/gynecologic histories. Family history significant for diabetes   and hypertension. And [name] reports no known drug allergies.     The   following portions of the patient's history were reviewed and updated as   appropriate: allergies, current medications, past family history, past medical   history, past social history, past surgical  history and problem list.   Today's   ultrasound showed an AGA fetus at 33+ week gestation, consistent with the given   dating.    Impression:  1- Chronic hypertension  2- History of severe   preeclampsia  at term   3- History of C-section   4- Rah negative    I counseled   [name] on the implications of chronic hypertension on pregnancy. Reviewed   with her the importance of monitoring her blood pressures at home throughout   the pregnancy and continuing her current medications. As long as her   hypertension is well controlled, adverse pregnancy outcomes are significantly   low. Chronic hypertension is associated with significantly increased risk of   superimposed preeclampsia, especially in the 3rd trimester. For this reason, I   do recommend assessing baseline liver and renal function to be used as a basis   of comparison should her hypertension become uncontrolled later on in pregnancy.   [name] reports that a baseline 24hr urine collection was not done. Even at   this late gestational age, I do recommend baseline testing.    I also mentioned   it to [name] that hypertensive disorders are also associated with growth   abnormalities. Serial growth ultrasounds are recommended, which would be in 3   more weeks. Given increase incidence of adverse outcomes at increasing   gestational age, antenatal testing should be started at around 34 weeks with   the recommendation of indicated repeat cesarean delivery at 39 weeks.       Low-dose aspirin therapy has been shown to decrease the risk of preeclampsia.   However, it has been shown to be effective when initiated at earlier than 16   weeks. Starting at this late gestational age has not been shown to be effective   and I did not endorse it.    I also recommended Rhogam for her given her Rh   negative status.    Summary of Recommendations:  1- Return for weekly antenatal   testing  2- Continue Aldomet 250 mg twice a day  3- Repeat growth in 3 weeks    4- Obtain  Baseline labs (CMP) and 24 urine collection  5- Rhogam now and then   postpartum  6- Repeat C-section at 39 weeks      [name] expressed good   understanding of these recommendations. All of her questions and concerns were   addressed.     As always, thank you for allowing me to take part in the care of   your patients. Feel free to contact me should you have any questions from   today's consultation.    Sincerely    ANKIT A. [facility], MD    45 min visit spent   on counseling and coordination of care.      Blanchie Serve  [phone]         Assessment and Plan:   1. IUP at [redacted]w[redacted]d gestation  2. No evidence of acute maternal or fetal compromise. No evidence for labor or ROM.  3. Reactive NST; reassuring fetal surveillance  4. Followup for prenatal care as instructed, scheduled and explained to you at the time of discharge by myself  5. Routine followup, instructions, and precautions (as given by myself and reinforced by RN at discharge) and communicated to patient with a CMI    Mariel Kansky, MD

## 2018-04-08 NOTE — Progress Notes (Signed)
1215 Face to face with Dr. Lawerance Bach, verbally orders for PO hydration and states he will check pt when he returns from OR. Does not order IV or lab work.

## 2018-04-08 NOTE — Progress Notes (Signed)
1245 MD performs SVE, states that pt is closed/long/firm. No vaginal bleeding noted. Checks fetal monitoring strip. Educates pt on s/s of preeclampsia, when to return to hospital for strong contractions q2-3 minutes, ROM and for severe headaches and nausea. Will d/c pt.

## 2018-04-08 NOTE — Discharge Instr - AVS First Page (Signed)
Reason for your Hospital Admission:  Abdominal pain      Instructions for after your discharge:    Recognizing Labor    The beginning of labor is the beginning of birth. You'll start to feel strong contractions. That's when the muscles of your uterus tighten up to help push your baby out during birth.  Yes, labor has probably started  Signs of labor include:   Your contractions are getting stronger and more painful instead of weaker. You'll probably feel them throughout your whole uterus.   Your contractions are regular. This means that you feel them about every 5 to 10 minutes. And they are getting closer together.   You have pink-colored or blood-streaked fluid from your vagina.   You feel that the baby has "dropped" lower in your pelvis   Your water breaks. It may be a gush or a slow trickle of clear fluid from your vagina.  No, it's probably not real labor  Signs of false labor include:   Your contractions aren't regular or strong.   You feel the contractions only in your lower uterus.   Your contractions go away when you walk or change position.   Your contractions go away after drinking fluids.  When to call your healthcare provider  Call your healthcare provider or clinic right away if you notice any of these signs:   Fluid from your vagina, with or without contractions.   Bleeding heavy enough that you need a sanitary pad.   You don't feel your baby moving as much as before.    NOTE: Contractions are timed by both of these measures:   The length of each contraction from its start to its finish.   How far apart the contractions are-the time between the start of one contraction and the start of the next contraction.   Date Last Reviewed: 08/10/2016   2000-2018 [employer], [place]. [address] [place], [place] [address]. All rights reserved. This information is not intended as a substitute for professional medical care. Always follow your healthcare professional's instructions.         Kick Counts    It's normal to worry about your baby's health. One way you can knowyour baby's doing well is to record the baby's movements once a day. This is called a kick count.Remember to take your kick count records to all your appointments with your healthcare provider.  How to count kicks  Here are tips for counting kicks:   Choose a time when the baby is active, such as after a meal.   Sit comfortably or lie on your side.   The first time the baby moves,write downthe time.   Count each movement until the baby has moved10times. This can take from 20 minutes to 2hours.   Try to do it at the same time each day.  When to call your healthcare provider  Call your healthcare providerright awayif you notice any of the following:   Your baby moves fewer than 10times in2hours while you're doing kick counts.   Your baby moves much less often than on thedays before.   You have not felt your baby move all day.  Date Last Reviewed: 10/10/2016   2000-2018 [employer], [place]. [address] [place], [place] [address]. All rights reserved. This information is not intended as a substitute for professional medical care. Always follow your healthcare professional's instructions.

## 2018-04-08 NOTE — Student H&P (Deleted)
OBSTETRICS CLINICAL DECISION UNIT HISTORY AND PHYSICAL EXAM  [facility]  Antonieta Pert and [place] with [facility]  04/08/2018 12:14 PM    Chief Complaint:   Abdominal Pain      History of Presenting Illness:   Brandy Jennings is a 27 y.o. G3P1011 at [redacted]w[redacted]d weeks  gestation by patient history with Estimated Date of Delivery: 04/23/18 who presents to labor and delivery for abdominal pain on her right side. She has significant past medical history of chronic hypertension and had PEC in her 2018 pregnancy. Her abdominal pain started last night and is intermittent. It ranges from a 3/10-5/10 in pain intensity. No rebound tenderness. The patient has no additional complaints and denies LOF, VB, and UCs. Additionally, she reports good FM. The patient reports that she has had benign prenatal care with the [facility]. I was unable to review those records. She did not get her rhogam shot at 28 weeks and she does not think that she had her Group B Strep swab.    Past Gynecologic History: none; no STDs, no PID, no abnormal pap smears.     PMH: Chronic hypertension- diagnosed in 2010.    Meds: Labetalol 250 mg BID. Taking inconsistently.    Prenatal vitamins    Family History: Father- HTN    Mother- Diabetes Type II;     Alcohol abuse, Asthma, Anemia    Brother- Asthma    PSH: C-section in 2018   Ectopic pregnancy requiring salpingectomy in 2012     Medications:      Prescriptions Prior to Admission   Medication Sig   . methyldopa (ALDOMET) 250 MG tablet Take 250 mg by mouth 2 (two) times daily   . Prenatal Vit-Fe Fumarate-FA (PRENATAL 1+1 PO) Take by mouth.       Allergies:   No Known Allergies    Review of Systems:   A extended review of systems was obtained from the patient.  For the following systems the patient denied any symptomatology: constitutional symptoms such as fever or weight loss, cardiovascular and respiratory  For the following systems the patient reported symptoms: Abdominal  Pain     Physical Exam:     General: appears comfortable. No apparent distress  Abdomen: gravid, NT with appropriate FH.  Extremities: no cyanosis or erythema, mild bilateral LE edema, calves pain-free, symmetric and nontender.  Heart: RRR w/o M/G/R  Lungs: CTAB w/o w/r/r and w/ good air entry throughout  Skin: intact, no traumatic injury (contusion, laceration or abrasion), exanthem or excoriation   Cervix: closed/long/firm    FHR: Baseline 140, accelerations present, no decelerations. Irregular contractions not perceived by patient.    Recent Labs:     Results     ** No results found for the last 24 hours. **          Radiology:     Radiology Results (24 Hour)     ** No results found for the last 24 hours. **           Assessment and Plan:   1. IUP at [redacted]w[redacted]d gestation.  2. No evidence of acute maternal or fetal compromise  3. Reactive NST; reassuring fetal surveillance.  4. Followup for prenatal care as indicated. Patient told to f/u with [facility] since she plans to deliver in [place], [place].   5. Routine followup, instructions, and precautions. Patient educated on s/s of PEC.     Dennie Maizes, BS

## 2018-04-13 ENCOUNTER — Observation Stay: Payer: No Typology Code available for payment source | Admitting: Certified Registered"

## 2018-04-13 ENCOUNTER — Encounter
Admission: RE | Disposition: A | Discharge status: Home / Self Care | Payer: Self-pay | Source: Ambulatory Visit | Attending: Obstetrics & Gynecology

## 2018-04-13 ENCOUNTER — Inpatient Hospital Stay
Admission: RE | Admit: 2018-04-13 | Discharge: 2018-04-16 | DRG: 540 | Disposition: A | Discharge status: Home / Self Care | Payer: No Typology Code available for payment source | Source: Ambulatory Visit | Attending: Obstetrics & Gynecology | Admitting: Obstetrics & Gynecology

## 2018-04-13 DIAGNOSIS — O113 Pre-existing hypertension with pre-eclampsia, third trimester: Secondary | ICD-10-CM | POA: Diagnosis present

## 2018-04-13 DIAGNOSIS — O4292 Full-term premature rupture of membranes, unspecified as to length of time between rupture and onset of labor: Principal | ICD-10-CM | POA: Diagnosis present

## 2018-04-13 DIAGNOSIS — Z3A38 38 weeks gestation of pregnancy: Secondary | ICD-10-CM

## 2018-04-13 DIAGNOSIS — O34211 Maternal care for low transverse scar from previous cesarean delivery: Secondary | ICD-10-CM | POA: Diagnosis present

## 2018-04-13 LAB — TYPE AND SCREEN
AB Screen Gel: NEGATIVE
ABO Rh: O NEG

## 2018-04-13 LAB — CREATININE, SERUM: Creatinine: 0.8 mg/dL (ref 0.6–1.0)

## 2018-04-13 LAB — CBC
Absolute NRBC: 0 10*3/uL (ref 0.00–0.00)
Hematocrit: 38.1 % (ref 34.7–43.7)
Hgb: 12.3 g/dL (ref 11.4–14.8)
MCH: 29.1 pg (ref 25.1–33.5)
MCHC: 32.3 g/dL (ref 31.5–35.8)
MCV: 90.1 fL (ref 78.0–96.0)
MPV: 9.6 fL (ref 8.9–12.5)
Nucleated RBC: 0 /100 WBC (ref 0.0–0.0)
Platelets: 454 10*3/uL — ABNORMAL HIGH (ref 142–346)
RBC: 4.23 10*6/uL (ref 3.90–5.10)
RDW: 13 % (ref 11–15)
WBC: 8.89 10*3/uL (ref 3.10–9.50)

## 2018-04-13 LAB — ALT: ALT: 98 U/L — ABNORMAL HIGH (ref 0–55)

## 2018-04-13 LAB — HIV RAPID
Rapid HIV-1 p24 Antigen: NONREACTIVE
Rapid HIV-1/2  Antibody: NONREACTIVE

## 2018-04-13 LAB — AST: AST (SGOT): 76 U/L — ABNORMAL HIGH (ref 5–34)

## 2018-04-13 LAB — GFR: EGFR: >60

## 2018-04-13 LAB — HEMOLYSIS INDEX: Hemolysis Index: 4 (ref 0–18)

## 2018-04-13 LAB — URIC ACID: Uric acid: 5 mg/dL (ref 2.6–6.0)

## 2018-04-13 LAB — LACTATE DEHYDROGENASE: LDH: 244 U/L (ref 125–331)

## 2018-04-13 SURGERY — Surgical Case
Anesthesia: Regional | Wound class: Clean Contaminated

## 2018-04-13 MED ORDER — CEFAZOLIN SODIUM 1 G IJ SOLR
2.00 g | INTRAMUSCULAR | Status: AC
Start: 2018-04-13 — End: 2018-04-13
  Administered 2018-04-13: 23:00:00 2 g via INTRAVENOUS
  Filled 2018-04-13: qty 2000

## 2018-04-13 MED ORDER — CEFAZOLIN SODIUM 1 G IJ SOLR
2.00 g | INTRAMUSCULAR | Status: DC
Start: 2018-04-13 — End: 2018-04-13

## 2018-04-13 MED ORDER — ONDANSETRON HCL 4 MG/2ML IJ SOLN
INTRAMUSCULAR | Status: DC | PRN
Start: 2018-04-13 — End: 2018-04-13
  Administered 2018-04-13: 4 mg via INTRAVENOUS

## 2018-04-13 MED ORDER — PHENYLEPHRINE HCL 10 MG/ML IV SOLN (WRAP)
Status: DC | PRN
Start: 2018-04-13 — End: 2018-04-14
  Administered 2018-04-13: 23:00:00 25 ug/min via INTRAVENOUS

## 2018-04-13 MED ORDER — SOD CITRATE-CITRIC ACID 500-334 MG/5ML PO SOLN
30.00 mL | Freq: Once | ORAL | Status: DC | PRN
Start: 2018-04-13 — End: 2018-04-16

## 2018-04-13 MED ORDER — ONDANSETRON HCL 4 MG/2ML IJ SOLN
INTRAMUSCULAR | Status: AC
Start: 2018-04-13 — End: ?
  Filled 2018-04-13: qty 2

## 2018-04-13 MED ORDER — LACTATED RINGERS IV SOLN
INTRAVENOUS | Status: DC
Start: 2018-04-13 — End: 2018-04-16

## 2018-04-13 MED ORDER — FAMOTIDINE 20 MG/2ML IV SOLN
INTRAVENOUS | Status: AC
Start: 2018-04-13 — End: 2018-04-13
  Filled 2018-04-13: qty 2

## 2018-04-13 MED ORDER — PHENYLEPHRINE HCL-NACL 100-0.9 MG/250ML-% IV SOLN
INTRAVENOUS | Status: AC
Start: 2018-04-13 — End: 2018-04-14
  Filled 2018-04-13: qty 250

## 2018-04-13 MED ORDER — METOCLOPRAMIDE HCL 5 MG/ML IJ SOLN
INTRAMUSCULAR | Status: DC | PRN
Start: 2018-04-13 — End: 2018-04-14
  Administered 2018-04-13: 10 mg via INTRAVENOUS

## 2018-04-13 MED ORDER — AZITHROMYCIN 500 MG IV SOLR
500.00 mg | INTRAVENOUS | Status: AC
Start: 2018-04-13 — End: 2018-04-13
  Administered 2018-04-13: 23:00:00 500 mg via INTRAVENOUS
  Filled 2018-04-13: qty 500

## 2018-04-13 MED ORDER — FENTANYL CITRATE (PF) 50 MCG/ML IJ SOLN (WRAP)
INTRAMUSCULAR | Status: AC
Start: 2018-04-13 — End: ?
  Filled 2018-04-13: qty 2

## 2018-04-13 MED ORDER — METOCLOPRAMIDE HCL 5 MG/ML IJ SOLN
INTRAMUSCULAR | Status: AC
Start: 2018-04-13 — End: ?
  Filled 2018-04-13: qty 2

## 2018-04-13 MED ORDER — LACTATED RINGERS IV BOLUS
1000.00 mL | Freq: Once | INTRAVENOUS | Status: AC
Start: 2018-04-13 — End: 2018-04-13
  Administered 2018-04-13: 22:00:00 1000 mL via INTRAVENOUS

## 2018-04-13 MED ORDER — FAMOTIDINE 10 MG/ML IV SOLN (WRAP)
INTRAVENOUS | Status: DC | PRN
Start: 2018-04-13 — End: 2018-04-14
  Administered 2018-04-13: 20 mg via INTRAVENOUS

## 2018-04-13 MED ORDER — FENTANYL CITRATE (PF) 50 MCG/ML IJ SOLN (WRAP)
INTRAMUSCULAR | Status: DC | PRN
Start: 2018-04-13 — End: 2018-04-14
  Administered 2018-04-13: 10 ug via INTRATHECAL

## 2018-04-13 MED ORDER — MORPHINE SULFATE (PF) 1 MG/ML IJ SOLN
INTRAMUSCULAR | Status: DC | PRN
Start: 2018-04-13 — End: 2018-04-14
  Administered 2018-04-13: .2 mg via INTRATHECAL

## 2018-04-13 MED ORDER — BUPIVACAINE HCL (PF) 0.75 % IJ SOLN
INTRAMUSCULAR | Status: DC | PRN
Start: 2018-04-13 — End: 2018-04-14
  Administered 2018-04-13: 1.5 mL via INTRASPINAL

## 2018-04-13 MED ORDER — OXYTOCIN 30 UNITS IN SODIUM CHLORIDE 500 ML (BOLUS)
INTRAVENOUS | Status: DC | PRN
Start: 2018-04-13 — End: 2018-04-14
  Administered 2018-04-13: 30 [IU] via INTRAVENOUS

## 2018-04-13 MED ORDER — OXYTOCIN-SODIUM CHLORIDE 30-0.9 UT/500ML-% IV SOLN
INTRAVENOUS | Status: AC
Start: 2018-04-13 — End: 2018-04-13
  Filled 2018-04-13: qty 1000

## 2018-04-13 MED ORDER — MORPHINE SULFATE (PF) 0.5 MG/ML IJ SOLN
INTRAMUSCULAR | Status: AC
Start: 2018-04-13 — End: ?
  Filled 2018-04-13: qty 10

## 2018-04-13 SURGICAL SUPPLY — 29 items
APPLCATOR CHLORAPREP 26ML (Prep) ×2 IMPLANT
BASIN L (Procedure Accessories) ×2
ELECTRODE ADULT PATIENT RETURN L9 FT REM POLYHESIVE ACRYLIC FOAM (Procedure Accessories) ×1 IMPLANT
ELECTRODE ELECTROSURGICAL BLADE PENCIL L10 FT OD3/8 IN PLUMEPEN ELITE (Cautery) ×1 IMPLANT
ELECTRODE ESURG BLDE PNCL PLUMEPEN ELT (Cautery) ×2
ELECTRODE PATIENT RETURN L9 FT VALLEYLAB (Procedure Accessories) ×1
ELECTRODE PT RTN RM PHSV ACRL FM C30- LB (Procedure Accessories) ×1
GLOVE SRG PLISPRN 8 BGL PI INDCTR (Glove) ×1
GLOVE SURG BIOGEL LF SZ8 (Glove) ×2 IMPLANT
GLOVE SURGICAL 8 BIOGEL PI INDICATOR (Glove) ×1
GLOVE SURGICAL 8 BIOGEL PI INDICATOR UNDERGLOVE POWDER FREE SMOOTH (Glove) ×1 IMPLANT
SET BASIN WASH LABOR AND DELIVERY ALEX10479A (Procedure Accessories) ×1 IMPLANT
SLEEVE SEQUEN COMP KNEE REG (Procedure Accessories) ×2 IMPLANT
SUTURE ABS 0 CT1 MNCRL 36IN MFL VIOL (Suture) ×2
SUTURE ABS 1 CT VCL 36IN BRD COAT VIOL (Suture) ×2
SUTURE ABS 4-0 KS COAT VCL 27IN BRD UD (Suture) ×1
SUTURE ABS PLN 3-0 CT 27IN MFL YELLOWISH (Suture) ×1
SUTURE COATED VICRYL 1 CT L36 IN BRAID (Suture) ×2
SUTURE COATED VICRYL 1 CT L36 IN BRAID COATED VIOLET ABSORBABLE (Suture) ×2 IMPLANT
SUTURE COATED VICRYL 4-0 KS L27 IN BRAID (Suture) ×1
SUTURE COATED VICRYL 4-0 KS L27 IN BRAID UNDYED ABSORBABLE (Suture) ×1 IMPLANT
SUTURE MONOCRYL 0 CT-1 L36 IN (Suture) ×2
SUTURE MONOCRYL 0 CT-1 L36 IN MONOFILAMENT VIOLET ABSORBABLE (Suture) ×2 IMPLANT
SUTURE MONOCRYL 3-0 KS 27IN (Suture) ×2 IMPLANT
SUTURE PLAIN GUT PLAIN 3-0 CT L27 IN (Suture) ×1
SUTURE PLAIN GUT PLAIN 3-0 CT L27 IN MONOFILAMENT YELLOWISH TAN (Suture) ×1 IMPLANT
SUTURE VICRYL 2-0 CT1 36IN (Suture) ×2 IMPLANT
TRAY C-SECTION PACK (Pack) ×2 IMPLANT
TRAY CATH SSTEP SLOCK BAG 16FR (Tray) ×2 IMPLANT

## 2018-04-13 NOTE — Anesthesia Procedure Notes (Signed)
Spinal      Patient location during procedure: OR  Reason for block: labor    Block at Surgeon's request: Yes      Start time: 04/13/2018 10:58 PM    End time: 04/13/2018 11:03 PM    Staffing  Anesthesiologist: Francisco Capuchin  Performed: anesthesiologist       Pre-procedure Checklist   Completed: patient identified, surgical consent, pre-op evaluation, timeout performed, risks and benefits discussed, monitors and equipment checked, anesthesia consent given and correct site  Timeout Completed:  04/13/2018 10:58 PM    Spinal  Patient monitoring: pulse oximetry and NIBP    Premedication: Meaningful Contact Maintained    Patient position: sitting    Sterile Technique: patient draped, DuraPrep, mask used and wearing gloves  Skin Local: lidocaine 1%    Successful attempt  Interspace: L4-5    Approach: midline  Number of attempts: 1      Needle Placement    Needle gauge: 25              Paresthesia Pain: no    Catheter Placement   Catheter type: none  CSF Return: Yes  Blood Return: No              Assessment   Sensory level: T4 and Bilateral  Block Outcome: patient tolerated procedure well, no complications and successful block

## 2018-04-13 NOTE — Op Note (Signed)
Operative Report    Date of Procedure:  04/13/2018    Patient Name:  [facility]    Pre-Op Dx:    Term, previous C/S in labor.    Post-Op Dx:   same    Procedure:  Procedure(s):  CESAREAN SECTION  (repeat LSTCS)    Surgeon:  Donald Siva, MD    Assistant: Dr. Tomasa Rand   Circulator: Herold Harms, RN  Scrub Person: Thompson-Gardner, Annmarie M  Second Circulator: Hulda Humphrey, RN    Anesthesia: Spinal    Complications:  none    EBL: 500 mL    IVF:  Lactated Ringers    Specimen: none    Findings:  Live female  infant 6 lb 4.9 oz (2860 g)      Apgars: 9  at 1 minute and 9  at 5 minutes       Procedure:    After informed consent was obtained, the patient was taken to the OR where adequate anesthesia was obtained.  She was prepped and draped in the usual sterile fashion in the supine position with a leftward tilt.  A Pfannenstiel skin incision was made with the scalpel and taken down to the fascia.  The fascia was nicked in the midline and extended laterally.  The rectus muscle was dissected off the fascia and separated in the midline.  The peritoneum was entered and the incision was extended with good visualization of the bladder.  The bladder blade was inserted and the vesicouterine peritoneum was incised and the bladder flap was manually dissected off the lower uterine segment. Pediatrics were present at the time of delivery. The scalpel was used to make a transverse incision in the lower uterine segment and the incision was manually extended.  The infant was delivered atraumatically from Vertex presentation, the nose and mouth were suctioned with the bulb, the cord was clamped and cut, and the infant was handed off to the pediatrician.      The placenta was delivered manually and the uterus was explored to remove all products of conception and cleared of all clots and debris.  The uterus was exteriorized and the uterine incision was re-approximated with #1 Monocryl in a lambert fashion.   A second imbricating layer was placed.  Adequate hemostasis was noted.  The uterus was returned to the abdomen and the gutters were cleared of all clots and debris.  The uterine incision appeared adequately hemostatic. The muscle and peritoneum were closed with 3 interrupted stitches of 2-0 Vicryl suture. The fascia was re-approximated with 0-Vicryl in a running fashion using 2 sutures.  The subcuticular tissue was irrigated with normal saline.  Hemostasis was assured with the Bovie cautery.  The  subcutaneous tissue was re-approximated with 3-0 plain suture.  The skin was closed with skin staples.      The patient tolerated the procedure well.  All sponge, needle, and instrument counts were correct x 2.  The patient was taken to the recovery room in stable condition.        Donald Siva, MD

## 2018-04-13 NOTE — Plan of Care (Signed)
Problem: Safety  Goal: Patient will be free from injury during hospitalization  Outcome: Progressing

## 2018-04-13 NOTE — Anesthesia Preprocedure Evaluation (Addendum)
Anesthesia Evaluation    AIRWAY    Mallampati: III    TM distance: >3 FB  Neck ROM: full  Mouth Opening:full   CARDIOVASCULAR    cardiovascular exam normal, regular and normal       DENTAL    no notable dental hx     PULMONARY    pulmonary exam normal and clear to auscultation     OTHER FINDINGS                  Relevant Problems   No relevant active problems               Anesthesia Plan    ASA 3 - emergent     spinal               (27 yo G41P1011 female at [redacted]w[redacted]d gestation with PMH chronic hypertension, GERD, secondhand smoke exposure presents for repeat cesarean section.  Previous cesarean section for severe pre-eclampsia; reports having spinal that failed  Complications with current pregnancy as above  Denies history of asthma, diabetes, seizures, myocardial infarction, stroke, deep venous thrombosis or pulmonary embolus  METS > 4  Ate a bowl of eggs at 9pm  Airway appears adequate   No problems with previous general anesthetics    H/H 12.3/38.1  Platelets 454  Type and screen in process    Risks and benefits of spinal placement were discussed with the patient (including but not limited to) headache, backache, one sided or ineffective block, difficult placement, infection, bleeding, and nerve damage.    Risks and benefits of GA were discussed with the patient (including but not limited to) damage to lips/teeth/gums, aspiration resulting in prolonged mechanical ventilation or brain injury, corneal abrasions, myalgias, neuropathy due to positioning, myocardial infarction, stroke, and allergic reactions to medications given. The patient states understanding and consents to the anesthetic plan.   )      Detailed anesthesia plan: epidural, spinal and general endotracheal        Post op pain management: PO analgesics    informed consent obtained    Plan discussed with CRNA.      pertinent labs reviewed             Signed by: Francisco Capuchin 04/13/18 10:34 PM

## 2018-04-13 NOTE — Progress Notes (Signed)
Patient presented in labor with previous C/S and PROM.  To proceed with C/S.

## 2018-04-13 NOTE — H&P (Signed)
History and Physical    6/4/201910:00 PM    Chief Complaint: Rupture of Membranes Evaluation      HPI:  Brandy Jennings is a 27 y.o. G3P1011 [redacted]w[redacted]d weeks gestation with Estimated Date of Delivery: 04/23/18 who presents by ambulance with SROM (light mec).  Patient reports occasional contractions.  Patient has history of previous C/S.      POBHx:   OB History   Gravida Para Term Preterm AB Living   3 1 1   1 1    SAB TAB Ectopic Multiple Live Births       1 0 1      # Outcome Date GA Lbr Len/2nd Weight Sex Delivery Anes PTL Lv   3 Current            2 Term 03/15/17 [redacted]w[redacted]d  2.35 kg (5 lb 2.9 oz) M CSECT Spinal, Gen N LIV   1 Ectopic 2012                  PMHx:   Past Medical History:   Diagnosis Date   . Headache     migraines   . Hypertension 03/15/2017       Surg Hx:   Past Surgical History:   Procedure Laterality Date   . CESAREAN SECTION N/A 03/15/2017    Procedure: CESAREAN SECTION;  Surgeon: Isla Pence, MD;  Location: [facility];  Service: Obstetrics;  Laterality: N/A;   . ECTOPIC PREGNANCY SURGERY         Fam Hx:   Family History   Problem Relation Age of Onset   . Diabetes Mother    . Hypertension Father        Soc Hx:   Social History     Social History   . Marital status: Single     Spouse name: N/A   . Number of children: N/A   . Years of education: N/A     Occupational History   . Not on file.     Social History Main Topics   . Smoking status: Never Smoker   . Smokeless tobacco: Never Used   . Alcohol use No   . Drug use: No   . Sexual activity: Yes     Partners: Male     Other Topics Concern   . Not on file     Social History Narrative   . No narrative on file       Meds:   Prescriptions Prior to Admission   Medication Sig Dispense Refill Last Dose   . methyldopa (ALDOMET) 250 MG tablet Take 250 mg by mouth 2 (two) times daily   Past Week at Unknown time   . Prenatal Vit-Fe Fumarate-FA (PRENATAL 1+1 PO) Take by mouth.   04/08/2018 at Unknown time       Allergies:  No Known Allergies    ROS:  All negative    There were no vitals taken for this visit.    Gen: NAD, AAO x 3  HEENT: PERRLA  Lungs: CTA bilaterally  Heart: RRR  Abd: Soft, gravid, nontender    FHR:  ,  ,  ,  140's, +accels    Ctx:  irregular    Cervix:          Ftp/vertex/-2    Labs:             Assessment:    [redacted]w[redacted]d weeks IUP  Previous C/S with PROM      Plan:  Admit to L&D  For repeat C/S.      Donald Siva, MD

## 2018-04-14 LAB — CBC AND DIFFERENTIAL
Absolute NRBC: 0 10*3/uL (ref 0.00–0.00)
Basophils Absolute Automated: 0.02 10*3/uL (ref 0.00–0.08)
Basophils Automated: 0.1 %
Eosinophils Absolute Automated: 0.01 10*3/uL (ref 0.00–0.44)
Eosinophils Automated: 0.1 %
Hematocrit: 28.5 % — ABNORMAL LOW (ref 34.7–43.7)
Hgb: 9.3 g/dL — ABNORMAL LOW (ref 11.4–14.8)
Immature Granulocytes Absolute: 0.06 10*3/uL (ref 0.00–0.07)
Immature Granulocytes: 0.4 %
Lymphocytes Absolute Automated: 1.43 10*3/uL (ref 0.42–3.22)
Lymphocytes Automated: 9.8 %
MCH: 30.2 pg (ref 25.1–33.5)
MCHC: 32.6 g/dL (ref 31.5–35.8)
MCV: 92.5 fL (ref 78.0–96.0)
MPV: 9.4 fL (ref 8.9–12.5)
Monocytes Absolute Automated: 0.93 10*3/uL — ABNORMAL HIGH (ref 0.21–0.85)
Monocytes: 6.3 %
Neutrophils Absolute: 12.2 10*3/uL — ABNORMAL HIGH (ref 1.10–6.33)
Neutrophils: 83.3 %
Nucleated RBC: 0 /100 WBC (ref 0.0–0.0)
Platelets: 372 10*3/uL — ABNORMAL HIGH (ref 142–346)
RBC: 3.08 10*6/uL — ABNORMAL LOW (ref 3.90–5.10)
RDW: 13 % (ref 11–15)
WBC: 14.65 10*3/uL — ABNORMAL HIGH (ref 3.10–9.50)

## 2018-04-14 LAB — RH IMM GLOBULIN

## 2018-04-14 LAB — PROTEIN / CREATININE RATIO, URINE
Urine Creatinine, Random: 162.7 mg/dL
Urine Protein Random: 63.1 mg/dL — ABNORMAL HIGH (ref 1.0–14.0)
Urine Protein/Creatinine Ratio: 0.4

## 2018-04-14 LAB — GFR: EGFR: >60

## 2018-04-14 LAB — CREATININE, SERUM: Creatinine: 0.8 mg/dL (ref 0.6–1.0)

## 2018-04-14 LAB — AST: AST (SGOT): 85 U/L — ABNORMAL HIGH (ref 5–34)

## 2018-04-14 LAB — HEPATITIS C ANTIBODY: Hepatitis C, AB: NONREACTIVE

## 2018-04-14 LAB — L&D MAGNESIUM CRITICAL RESULT CALLED FOR LEVEL GREATER THAN 10 MG/DL
L&D Magnesium: 4.7 mg/dL — ABNORMAL HIGH (ref 1.7–2.2)
L&D Magnesium: 5.4 mg/dL — ABNORMAL HIGH (ref 1.7–2.2)

## 2018-04-14 LAB — HEMOLYSIS INDEX
Hemolysis Index: 12 (ref 0–18)
Hemolysis Index: 12 (ref 0–18)
Hemolysis Index: 0 (ref 0–18)

## 2018-04-14 LAB — RUBELLA ANTIBODY, IGG: Rubella AB, IgG: 2.67

## 2018-04-14 LAB — SYPHILIS SCREEN IGG AND IGM: Syphilis Screen IgG and IgM: NONREACTIVE

## 2018-04-14 LAB — ALT: ALT: 87 U/L — ABNORMAL HIGH (ref 0–55)

## 2018-04-14 LAB — FETAL BLEED SCREEN: Fetal Bleed Screen: NEGATIVE

## 2018-04-14 LAB — HEPATITIS B SURFACE ANTIGEN W/ REFLEX TO CONFIRMATION: Hepatitis B Surface Antigen: NONREACTIVE

## 2018-04-14 MED ORDER — MEPERIDINE HCL 25 MG/ML IJ SOLN
12.5000 mg | INTRAMUSCULAR | Status: DC | PRN
Start: 2018-04-14 — End: 2018-04-15

## 2018-04-14 MED ORDER — ONDANSETRON HCL 4 MG/2ML IJ SOLN
4.0000 mg | Freq: Once | INTRAMUSCULAR | Status: DC | PRN
Start: 2018-04-14 — End: 2018-04-14

## 2018-04-14 MED ORDER — DIPHENHYDRAMINE HCL 50 MG/ML IJ SOLN
6.25 mg | INTRAMUSCULAR | Status: DC | PRN
Start: 2018-04-14 — End: 2018-04-16

## 2018-04-14 MED ORDER — OXYTOCIN-SODIUM CHLORIDE 30-0.9 UT/500ML-% IV SOLN
7.5000 [IU]/h | INTRAVENOUS | Status: AC
Start: 2018-04-14 — End: 2018-04-14

## 2018-04-14 MED ORDER — DIPHENHYDRAMINE HCL 50 MG/ML IJ SOLN
6.2500 mg | Freq: Four times a day (QID) | INTRAMUSCULAR | Status: DC | PRN
Start: 2018-04-14 — End: 2018-04-14

## 2018-04-14 MED ORDER — CALCIUM GLUCONATE 10 % IV SOLN
1.00 g | INTRAVENOUS | Status: DC | PRN
Start: 2018-04-14 — End: 2018-04-16

## 2018-04-14 MED ORDER — KETOROLAC TROMETHAMINE 30 MG/ML IJ SOLN
30.00 mg | Freq: Once | INTRAMUSCULAR | Status: AC
Start: 2018-04-14 — End: 2018-04-14
  Administered 2018-04-14: 03:00:00 30 mg via INTRAVENOUS
  Filled 2018-04-14: qty 1

## 2018-04-14 MED ORDER — HYDROMORPHONE HCL 1 MG/ML IJ SOLN
0.5000 mg | INTRAMUSCULAR | Status: DC | PRN
Start: 2018-04-14 — End: 2018-04-15

## 2018-04-14 MED ORDER — PROMETHAZINE HCL 12.5 MG PO TABS
12.5000 mg | ORAL_TABLET | Freq: Four times a day (QID) | ORAL | Status: DC | PRN
Start: 2018-04-14 — End: 2018-04-14

## 2018-04-14 MED ORDER — ONDANSETRON HCL 4 MG/2ML IJ SOLN
4.00 mg | Freq: Once | INTRAMUSCULAR | Status: DC | PRN
Start: 2018-04-14 — End: 2018-04-16

## 2018-04-14 MED ORDER — MISOPROSTOL 200 MCG PO TABS
800.0000 ug | ORAL_TABLET | Freq: Once | ORAL | Status: DC | PRN
Start: 2018-04-14 — End: 2018-04-15

## 2018-04-14 MED ORDER — RHO D IMMUNE GLOBULIN 1500 UNITS IM SOSY
300.00 ug | PREFILLED_SYRINGE | Freq: Once | INTRAMUSCULAR | Status: DC
Start: 2018-04-15 — End: 2018-04-14
  Filled 2018-04-14: qty 300

## 2018-04-14 MED ORDER — METHYLDOPA 250 MG PO TABS
250.00 mg | ORAL_TABLET | Freq: Two times a day (BID) | ORAL | Status: DC
Start: 2018-04-14 — End: 2018-04-14
  Filled 2018-04-14: qty 1

## 2018-04-14 MED ORDER — OXYCODONE-ACETAMINOPHEN 5-325 MG PO TABS
1.00 | ORAL_TABLET | ORAL | Status: DC | PRN
Start: 2018-04-14 — End: 2018-04-16
  Administered 2018-04-14 – 2018-04-16 (×6): 1 via ORAL
  Filled 2018-04-14 (×7): qty 1

## 2018-04-14 MED ORDER — ONDANSETRON HCL 4 MG/2ML IJ SOLN
4.0000 mg | Freq: Three times a day (TID) | INTRAMUSCULAR | Status: DC | PRN
Start: 2018-04-14 — End: 2018-04-14
  Administered 2018-04-14: 05:00:00 4 mg via INTRAVENOUS
  Filled 2018-04-14: qty 2

## 2018-04-14 MED ORDER — ACETAMINOPHEN 325 MG PO TABS
650.0000 mg | ORAL_TABLET | Freq: Once | ORAL | Status: DC | PRN
Start: 2018-04-14 — End: 2018-04-14

## 2018-04-14 MED ORDER — PROMETHAZINE HCL 12.5 MG RE SUPP
12.5000 mg | Freq: Four times a day (QID) | RECTAL | Status: DC | PRN
Start: 2018-04-14 — End: 2018-04-14

## 2018-04-14 MED ORDER — NALOXONE HCL 0.4 MG/ML IJ SOLN (WRAP)
0.10 mg | INTRAMUSCULAR | Status: DC | PRN
Start: 2018-04-14 — End: 2018-04-16

## 2018-04-14 MED ORDER — PROMETHAZINE HCL 25 MG/ML IJ SOLN
6.2500 mg | Freq: Once | INTRAMUSCULAR | Status: DC | PRN
Start: 2018-04-14 — End: 2018-04-14

## 2018-04-14 MED ORDER — NALBUPHINE HCL 10 MG/ML IJ SOLN
5.00 mg | INTRAMUSCULAR | Status: DC | PRN
Start: 2018-04-14 — End: 2018-04-16

## 2018-04-14 MED ORDER — PROMETHAZINE HCL 25 MG/ML IJ SOLN
6.2500 mg | Freq: Four times a day (QID) | INTRAMUSCULAR | Status: DC | PRN
Start: 2018-04-14 — End: 2018-04-14

## 2018-04-14 MED ORDER — MAGNESIUM SULFATE 40 GM/1000ML IV SOLN
2.00 g/h | INTRAVENOUS | Status: DC
Start: 2018-04-14 — End: 2018-04-15
  Administered 2018-04-14 (×2): 2 g/h via INTRAVENOUS
  Filled 2018-04-14: qty 1000

## 2018-04-14 MED ORDER — ACETAMINOPHEN 325 MG PO TABS
325.00 mg | ORAL_TABLET | ORAL | Status: DC | PRN
Start: 2018-04-14 — End: 2018-04-16
  Administered 2018-04-15 – 2018-04-16 (×3): 325 mg via ORAL
  Filled 2018-04-14 (×3): qty 1

## 2018-04-14 MED ORDER — PROMETHAZINE HCL 25 MG/ML IJ SOLN
6.25 mg | Freq: Once | INTRAMUSCULAR | Status: DC | PRN
Start: 2018-04-14 — End: 2018-04-16

## 2018-04-14 MED ORDER — RHO D IMMUNE GLOBULIN 1500 UNITS IM SOSY
300.00 ug | PREFILLED_SYRINGE | Freq: Once | INTRAMUSCULAR | Status: AC
Start: 2018-04-14 — End: 2018-04-14
  Administered 2018-04-14: 22:00:00 300 ug via INTRAMUSCULAR
  Filled 2018-04-14: qty 300

## 2018-04-14 MED ORDER — IBUPROFEN 600 MG PO TABS
600.0000 mg | ORAL_TABLET | Freq: Once | ORAL | Status: AC | PRN
Start: 2018-04-14 — End: 2018-04-14
  Administered 2018-04-14: 03:00:00 600 mg via ORAL
  Filled 2018-04-14: qty 1

## 2018-04-14 MED ORDER — ONDANSETRON 4 MG PO TBDP
4.0000 mg | ORAL_TABLET | Freq: Three times a day (TID) | ORAL | Status: DC | PRN
Start: 2018-04-14 — End: 2018-04-14

## 2018-04-14 MED ORDER — OXYCODONE-ACETAMINOPHEN 5-325 MG PO TABS
1.0000 | ORAL_TABLET | Freq: Once | ORAL | Status: AC | PRN
Start: 2018-04-14 — End: 2018-04-14
  Administered 2018-04-14: 1 via ORAL
  Filled 2018-04-14: qty 1

## 2018-04-14 MED ORDER — MAGNESIUM SULFATE 40 GM/1000 ML BOLUS FROM BAG
6.0000 g | Freq: Once | INTRAVENOUS | Status: AC
Start: 2018-04-14 — End: 2018-04-14
  Administered 2018-04-14: 02:00:00 6 g via INTRAVENOUS
  Filled 2018-04-14: qty 1000

## 2018-04-14 MED ORDER — IBUPROFEN 600 MG PO TABS
600.00 mg | ORAL_TABLET | Freq: Four times a day (QID) | ORAL | Status: DC | PRN
Start: 2018-04-14 — End: 2018-04-16
  Administered 2018-04-14 – 2018-04-16 (×8): 600 mg via ORAL
  Filled 2018-04-14 (×8): qty 1

## 2018-04-14 NOTE — Transfer of Care (Signed)
Anesthesia Transfer of Care Note    Patient: Brandy Jennings    Procedures performed: Procedure(s):  CESAREAN SECTION    Anesthesia type: Spinal    Patient location:OB PACU    Last vitals:   Vitals:    04/14/18 0001   BP: 124/63   Pulse: 74   Resp: 14   Temp: 36.4 C (97.6 F)   SpO2: 98%       Post pain: Patient not complaining of pain, continue current therapy      Mental Status:awake and alert     Respiratory Function: tolerating room air    Cardiovascular: stable    Nausea/Vomiting: patient not complaining of nausea or vomiting    Hydration Status: adequate    Post assessment: no apparent anesthetic complications and no reportable events    Signed by: Shawn Route Britiney Blahnik  04/14/18 12:01 AM

## 2018-04-14 NOTE — Progress Notes (Signed)
Anesthesia Pain Service Progress Note      Patient Name:     Brandy Jennings, Brandy Jennings [facility]    Procedure/Chief Complaint:     Procedure(s):  CESAREAN SECTION  Rupture of Membranes Evaluation      Date Details:     1 Day Post-Op  Pain Management Method:     Subarachnoid Morphine:  Medication: Duramorph 0.2 mg    Vitals:     Temp: Temp: 36.2 C (97.1 F)   HR: Heart Rate: 82   BP: BP: 110/55   RR: Resp Rate: 16   SpO2 SpO2: 98 %       Catheter Site:     N/A    Neurologic Status:     Motor Block  No    Complaints/Side Effects:     None    Mental Status:     awake and alert     Respiratory Status:     tolerating room air    Pain (Assessment and Plan):     Patient not complaining of pain, continue current therapy         Manuella Ghazi, MD  04/14/2018 10:43 AM

## 2018-04-14 NOTE — Plan of Care (Signed)
Problem: Safety  Goal: Patient will be free from injury during hospitalization  Outcome: Progressing

## 2018-04-14 NOTE — Lactation Note (Signed)
Met Mom at bedside in Labor and Delivery. Mom delivered earlier and has been treated with Magnesium Sulfate for high Blood Pressure. This is Mom second baby. Mom's older son is 29 year old. Mom states attempted to breastfeed her first child but was unsuccessful. Mom states desires to breastfeed this baby. Mom denies history of breast surgery. Mom states stopped smoking when found out was pregnant. Mom states takes Aldomet for Blood Pressure stabilization on a regular basis. Pumping initiated per Mom's request. Reviewed Pumping Guidelines and care of pump equipment. Spoke with Mom's night nurse, Myriam Jacobson, regarding assisting Mom with care of pump equipment and storage and labeling of breast milk. Mom has suction setting at a comfortable 30%. Called Newborn Nursery to ask when baby is being fed. Baby feeding schedule every 3 hours at 6, 9, 12 & 3 round the clock. Mom encouraged to pump at those times round the clock. Plan is for lactation to follow up with Mom in AM to check on progress. Mom is hoping to transfer to Post Partum Unit near midnight tonight.

## 2018-04-14 NOTE — Anesthesia Postprocedure Evaluation (Signed)
Anesthesia Post Evaluation    Patient: Brandy Jennings    Procedure(s):  CESAREAN SECTION    Anesthesia type: spinal    Last Vitals:   Vitals:    04/14/18 0001   BP: 124/63   Pulse: 74   Resp: 14   Temp: 36.4 C (97.6 F)   SpO2: 98%       Anesthesia Post Evaluation:     Patient Evaluated: PACU  Patient Participation: complete - patient participated  Level of Consciousness: awake and alert  Pain Score: 0  Pain Management: adequate  multimodal analgesia used between 6 hours prior to anesthesia start to PACU discharge    Airway Patency: patent    Anesthetic complications: No      PONV Status: none    Cardiovascular status: acceptable  Respiratory status: acceptable  Hydration status: acceptable        Signed by: Francisco Capuchin, 04/14/2018 12:17 AM

## 2018-04-14 NOTE — Progress Notes (Addendum)
Dr Henrene Hawking notified of pt's PCR at 0.4. Order given to start pt on Mag for 12 hours. 6g bolus loading dose then 2g maintenance. Draw mag level 2 hours after bolus then q6.

## 2018-04-14 NOTE — Progress Notes (Signed)
6/5/20196:59 AM    Patient without complaints.  Pain well controlled and tolerating regular diet.  thin lochia    BP 104/56   Pulse 74   Temp 97.6 F (36.4 C) (Oral)   Resp 16   Ht 1.626 m (5\' 4" )   Wt 82.1 kg (181 lb)   SpO2 98%   Breastfeeding? Unknown   BMI 31.07 kg/m      On MgSO4 2g/hour      Abd:  Soft, appropriately tender, ND, fundus firm, BS active  Inc:  clean, dry and intact with no problems      Recent Labs  Lab 04/13/18  2210   WBC 8.89   Hgb 12.3   Hematocrit 38.1   Platelets 454*       Assessment: 1 Day Post-Op  Chronic HTN with superimposed pre-eclampsia  No s/sx Mg toxicity.   Will stop MgSO4 at 12 hours    Plan: Continue care.

## 2018-04-14 NOTE — Addendum Note (Signed)
Addendum  created 04/14/18 0608 by Francisco Capuchin, MD    Order list changed, Order sets accessed

## 2018-04-14 NOTE — Progress Notes (Signed)
PRE-ECLAMPSIA/ECLAMPSIA POSTPARTUM NOTE  [facility]  [place] and [place]  04/14/2018 2:07 PM    Patient notes left sided swollen labia. Denies headache, visual changes and right upper quadrant abdominal pain. Normal post-op pain well controlled. Normal lochia. No ambulation as maintained on BR due to Mg infusion. Comfortable with SCDs in place.    Physical Exam:   Temp:  [97.1 F (36.2 C)-98.5 F (36.9 C)] 97.7 F (36.5 C)  Heart Rate:  [74-85] 82  Resp Rate:  [14-18] 16  BP: (102-152)/(52-90) 112/59     Patient Vitals for the past 24 hrs:   BP Temp Temp src Pulse Resp SpO2 Height Weight   04/14/18 1315 112/59 - - 82 - - - -   04/14/18 1215 102/58 97.7 F (36.5 C) Axillary 81 - - - -   04/14/18 1115 107/55 - - 85 - - - -   04/14/18 1015 110/55 - - 82 - - - -   04/14/18 0915 131/63 - - 80 16 - - -   04/14/18 0815 104/62 - - 80 15 - - -   04/14/18 0730 108/59 - - 75 - - - -   04/14/18 0715 109/59 97.1 F (36.2 C) Oral 79 16 - - -   04/14/18 0620 104/56 - - - - - - -   04/14/18 0615 - - - - 16 - - -   04/14/18 0519 102/52 - - - - - - -   04/14/18 0415 120/60 - - - - - - -   04/14/18 0410 - - - - 18 - - -   04/14/18 0315 143/83 - - - - - - -   04/14/18 0215 143/79 - - - - - - -   04/14/18 0140 152/65 - - - - - - -   04/14/18 0100 138/85 - - - - - - -   04/14/18 0026 116/88 - - - - - - -   04/14/18 0020 125/83 - - - - - - -   04/14/18 0015 133/90 - - - - - - -   04/14/18 0010 117/83 - - - - - - -   04/14/18 0005 114/73 - - - - - - -   04/14/18 0001 124/63 97.6 F (36.4 C) Oral 74 14 98 % - -   04/13/18 2203 - - - - - - 1.626 m (5\' 4" ) 82.1 kg (181 lb)   04/13/18 2200 135/89 98.5 F (36.9 C) Oral - 16 - - -       Intake/Output Summary (Last 24 hours) at 04/14/18 1407  Last data filed at 04/14/18 1315   Gross per 24 hour   Intake          2036.49 ml   Output             1725 ml   Net           311.49 ml     Gen: appears comfortable  Heart: RRR w/o m/g/r  Lungs: CTAB with good air entry  throughout. No w/r/r  Abd: non-distended, appropriately tender, uterus below umbilicus and firm  Incision: dressing clean, dry and intact.  Ext: no cyanosis or erythema, mild bilateral LE edema, calves pain-free, symmetric and nontender with SCDs in place. 2+ DTRs  V/V: Minimal labial edema. Does not appear to be a reaction to Foley and also no Bartholin's.    Recent Labs:      Results  Procedure Component Value Units Date/Time    Rubella virus IgG antibody titer [[id number]] Collected:  04/13/18 2230    Specimen:  Blood Updated:  04/14/18 1310     Rubella AB, IgG 2.67    ALT [[id number]]  (Abnormal) Collected:  04/14/18 0759    Specimen:  Blood Updated:  04/14/18 0827     ALT 87 (H) U/L     Narrative:       While on Magnesium.    AST [[id number]]  (Abnormal) Collected:  04/14/18 0759    Specimen:  Blood Updated:  04/14/18 0827     AST (SGOT) 85 (H) U/L     Narrative:       While on Magnesium.    Creatinine, serum [[id number]] Collected:  04/14/18 0759    Specimen:  Blood Updated:  04/14/18 0827     Creatinine 0.8 mg/dL     Narrative:       While on Magnesium.    L&D Magnesium - Magnesium (at end of MGS04 bolus and Q6hrs thereafter)  critical result will only be called for Mg level greater than 10mg /dl [[id number]]  (Abnormal) Collected:  04/14/18 0759     Updated:  04/14/18 0827     L&D Magnesium 5.4 (H) mg/dL     Narrative:       While on Magnesium.    Hemolysis index [[id number]] Collected:  04/14/18 0759     Updated:  04/14/18 0827     Hemolysis Index 0    Narrative:       While on Magnesium.    GFR [[id number]] Collected:  04/14/18 0759     Updated:  04/14/18 0827     EGFR >60.0    Narrative:       While on Magnesium.    CBC and differential (Q12H while receiving Magnesium drip) [[id number]]  (Abnormal) Collected:  04/14/18 0759    Specimen:  Blood from Blood Updated:  04/14/18 0818     WBC 14.65 (H) x10 3/uL      Hgb 9.3 (L) g/dL      Hematocrit 89.3 (L) %      Platelets 372 (H) x10 3/uL      RBC 3.08 (L) x10 6/uL       MCV 92.5 fL      MCH 30.2 pg      MCHC 32.6 g/dL      RDW 13 %      MPV 9.4 fL      Neutrophils 83.3 %      Lymphocytes Automated 9.8 %      Monocytes 6.3 %      Eosinophils Automated 0.1 %      Basophils Automated 0.1 %      Immature Granulocyte 0.4 %      Nucleated RBC 0.0 /100 WBC      Neutrophils Absolute 12.20 (H) x10 3/uL      Abs Lymph Automated 1.43 x10 3/uL      Abs Mono Automated 0.93 (H) x10 3/uL      Abs Eos Automated 0.01 x10 3/uL      Absolute Baso Automated 0.02 x10 3/uL      Absolute Immature Granulocyte 0.06 x10 3/uL      Absolute NRBC 0.00 x10 3/uL     Narrative:       While on Magnesium.    Hemolysis index [[id number]] Collected:  04/14/18 0505     Updated:  04/14/18 0620     Hemolysis Index 12  Narrative:       While on Magnesium.    L&D Magnesium - Magnesium (at end of MGS04 bolus and Q6hrs thereafter)  critical result will only be called for Mg level greater than 10mg /dl [[id number]]  (Abnormal) Collected:  04/14/18 0505     Updated:  04/14/18 4098     L&D Magnesium 4.7 (H) mg/dL     Narrative:       While on Magnesium.    Syphilis Screen IgG and IgM [[id number]] Collected:  04/13/18 2230     Updated:  04/14/18 0357     Syphilis Screen IgG and IgM Nonreactive    Hepatitis B surface antigen (HBsAg) [[id number]] Collected:  04/13/18 2230    Specimen:  Blood Updated:  04/14/18 0216     Hepatitis B Surface AG Non-Reactive    Hepatitis C (HCV) antibody, Total [[id number]] Collected:  04/13/18 2230    Specimen:  Blood Updated:  04/14/18 0216     Hepatitis C, AB Non-Reactive    Hemolysis index [[id number]] Collected:  04/13/18 2230     Updated:  04/14/18 0147     Hemolysis Index 12    Protein / creatinine ratio, urine [[id number]]  (Abnormal) Collected:  04/14/18 0008    Specimen:  Urine Updated:  04/14/18 0053     Urine Protein Random 63.1 (H) mg/dL      Urine Creatinine, Random 162.7 mg/dL      Urine Protein/Creatinine Ratio 0.4    HIV RAPID [[id number]] Collected:  04/13/18 2230     Updated:   04/13/18 2342     Rapid HIV-1 p24 Antigen Non-Reactive     Rapid HIV-1/2  Antibody Non-Reactive    Uric acid [[id number]] Collected:  04/13/18 2247    Specimen:  Blood Updated:  04/13/18 2325     Uric acid 5.0 mg/dL     Hemolysis index [[id number]] Collected:  04/13/18 2247     Updated:  04/13/18 2325     Hemolysis Index 4    GFR [[id number]] Collected:  04/13/18 2247     Updated:  04/13/18 2325     EGFR >60.0    Lactate dehydrogenase [[id number]] Collected:  04/13/18 2247    Specimen:  Blood Updated:  04/13/18 2325     LDH 244 U/L     Creatinine, serum [[id number]] Collected:  04/13/18 2247    Specimen:  Blood Updated:  04/13/18 2325     Creatinine 0.8 mg/dL     AST [[id number]]  (Abnormal) Collected:  04/13/18 2247    Specimen:  Blood Updated:  04/13/18 2325     AST (SGOT) 76 (H) U/L     ALT [[id number]]  (Abnormal) Collected:  04/13/18 2247    Specimen:  Blood Updated:  04/13/18 2325     ALT 98 (H) U/L     Type and screen [[id number]] Collected:  04/13/18 2210    Specimen:  Blood Updated:  04/13/18 2301     ABO Rh O NEG     AB Screen Gel NEG    Protein / creatinine ratio, urine [[id number]] Collected:  04/13/18 2247    Specimen:  Urine Updated:  04/13/18 2247    CBC without differential [[id number]] Collected:  04/13/18 2247    Specimen:  Blood from Blood Updated:  04/13/18 2247    CBC and differential [[id number]] Collected:  04/13/18 2230    Specimen:  Blood from Blood Updated:  04/13/18 2230    GROUP B STREP INTRAPARTUM [[id number]] Collected:  04/13/18 2230  Specimen:  Vaginal Swab Updated:  04/13/18 2230    CBC without differential [[id number]]  (Abnormal) Collected:  04/13/18 2210    Specimen:  Blood from Blood Updated:  04/13/18 2219     WBC 8.89 x10 3/uL      Hgb 12.3 g/dL      Hematocrit 40.9 %      Platelets 454 (H) x10 3/uL      RBC 4.23 x10 6/uL      MCV 90.1 fL      MCH 29.1 pg      MCHC 32.3 g/dL      RDW 13 %      MPV 9.6 fL      Nucleated RBC 0.0 /100 WBC      Absolute NRBC 0.00 x10 3/uL          Assessment  and Plan:   1. POD #1 s/p C/S - doing well. BPs much better controlled. Mg no doubt playing a role. Alphamethyldopa not available from pharmacy. Will likely institute a diff. antihypertensive after Mg off and if need is evident. Severe pre-e by evidence of liver involvement with elevated LFTs. No role for rpt. Labs at this time. Will cont. Mg SZ prophylaxis for 24 hrs. Platelets elevated could be a bit of HC which also makes the anemia that is noted possibly a bit worse. At d/c to home will recommend PO FeSO4. At D/C to home will assess if need for additional antihypertensive. Will continue to follow VS and watch for symptoms of severe anemia or evidence of continued blood loss.  2. Labial edema is minimal. Pt. Reassured. Likely due to fluid shifts and Mg infusion.  3. Will continue Mg for 24 hrs.    Mariel Kansky, MD

## 2018-04-14 NOTE — UM Notes (Signed)
[facility] medicaid  [id number]  04/13/18 2352  Admit to Inpatient Once    Status:    Question Answer Comment   Admitting Physician BURNS, THOMAS E III    Diagnosis [redacted] weeks gestation of pregnancy    Estimated Length of Stay > or = to 2 midnights    Tentative Discharge Plan? Home or Self Care    Patient Class Inpatient            6/4/201910:00 PM    Chief Complaint: Rupture of Membranes Evaluation      HPI:  Brandy Jennings is a 27 y.o. G3P1011 [redacted]w[redacted]d weeks gestation with Estimated Date of Delivery: 04/23/18 who presents by ambulance with SROM (light mec).  Patient reports occasional contractions.  Patient has history of previous C/S.      POBHx:                    OB History   Gravida Para Term Preterm AB Living   3 1 1   1 1    SAB TAB Ectopic Multiple Live Births       1 0 1      # Outcome Date GA Lbr Len/2nd Weight Sex Delivery Anes PTL Lv   3 Current            2 Term 03/15/17 [redacted]w[redacted]d  2.35 kg (5 lb 2.9 oz) M CSECT Spinal, Gen N LIV   1 Ectopic 2012                FHR:  ,  ,  ,  140's, +accels  Assessment:    [redacted]w[redacted]d weeks IUP  Previous C/S with PROM      Plan:  Admit to L&D  For repeat C/S.      Date of Procedure:  04/13/2018    Patient Name:  [facility]    Pre-Op Dx:    Term, previous C/S in labor.    Post-Op Dx:   same    Procedure:  Procedure(s):  CESAREAN SECTION  (repeat LSTCS)    Apgars: 9  at 1 minute and 9  at 5 minutes   Findings:  Live female  infant 6 lb 4.9 oz (2860 g)      04/14/18 in pacu   04/14/18 0001   BP: 124/63   Pulse: 74   Resp: 14   Temp: 36.4 C (97.6 F)   SpO2: 98%     Protein / creatinine ratio, urine [[id number]]  (Abnormal) Collected:  04/14/18 0008     Specimen:  Urine Updated:  04/14/18 0053     Urine Protein Random 63.1 (H) mg/dL      Urine Creatinine, Random 162.7 mg/dL      Urine Protein/Creatinine Ratio Protein/Creatinine Ratio 0.4               notified of pt's PCR at 0.4. Order given to start pt on Mag for 12 hours. 6g  bolus loading dose then 2g maintenance. Draw mag level 2 hours after bolus then q6.   04/14/18  Assessment: 1 Day Post-Op  Chronic HTN with superimposed pre-eclampsia  No s/sx Mg toxicity.   Will stop MgSO4 at 12 hours    Plan: Continue care.   04/14/18  Temp:  [97.1 F (36.2 C)-98.5 F (36.9 C)] 97.7 F (36.5 C)  Heart Rate:  [74-85] 82  Resp Rate:  [14-18] 16  BP: (102-152)/(52-90) 112/59  OB update in afternoon   Assessment and Plan:  1. POD #1 s/p C/S - doing well. BPs much better controlled. Mg no doubt playing a role. Alphamethyldopa not available from pharmacy. Will likely institute a diff. antihypertensive after Mg off and if need is evident. Severe pre-e by evidence of liver involvement with elevated LFTs. No role for rpt. Labs at this time. Will cont. Mg SZ prophylaxis for 24 hrs. Platelets elevated could be a bit of HC which also makes the anemia that is noted possibly a bit worse. At d/c to home will recommend PO FeSO4. At D/C to home will assess if need for additional antihypertensive. Will continue to follow VS and watch for symptoms of severe anemia or evidence of continued blood loss.  2. Labial edema is minimal. Pt. Reassured. Likely due to fluid shifts and Mg infusion.  3. Will continue Mg for 24 hrs.      Results     Procedure Component Value Units Date/Time    Rubella virus IgG antibody titer [[id number]] Collected:  04/13/18 2230    Specimen:  Blood Updated:  04/14/18 1310     Rubella AB, IgG 2.67    ALT [[id number]]  (Abnormal) Collected:  04/14/18 0759    Specimen:  Blood Updated:  04/14/18 0827     ALT 87 (H) U/L     Narrative:       While on Magnesium.    AST [[id number]]  (Abnormal) Collected:  04/14/18 0759    Specimen:  Blood Updated:  04/14/18 0827     AST (SGOT) 85 (H) U/L     Narrative:       While on Magnesium.    Creatinine, serum [[id number]] Collected:  04/14/18 0759    Specimen:  Blood Updated:  04/14/18 0827     Creatinine 0.8 mg/dL     Narrative:        While on Magnesium.    L&D Magnesium - Magnesium (at end of MGS04 bolus and Q6hrs thereafter)  critical result will only be called for Mg level greater than 10mg /dl [[id number]]  (Abnormal) Collected:  04/14/18 0759     Updated:  04/14/18 0827     L&D Magnesium 5.4 (H) mg/dL     Narrative:       While on Magnesium.    Hemolysis index [[id number]] Collected:  04/14/18 0759     Updated:  04/14/18 0827     Hemolysis Index 0    Narrative:       While on Magnesium.    GFR [[id number]] Collected:  04/14/18 0759     Updated:  04/14/18 0827     EGFR >60.0    Narrative:       While on Magnesium.    CBC and differential (Q12H while receiving Magnesium drip) [[id number]]  (Abnormal) Collected:  04/14/18 0759    Specimen:  Blood from Blood Updated:  04/14/18 0818     WBC 14.65 (H) x10 3/uL      Hgb 9.3 (L) g/dL      Hematocrit 42.7 (L) %      Platelets 372 (H) x10 3/uL      RBC 3.08 (L) x10 6/uL      MCV 92.5 fL      MCH 30.2 pg      MCHC 32.6 g/dL      RDW 13 %      MPV 9.4 fL      Neutrophils 83.3 %      Lymphocytes Automated 9.8 %      Monocytes 6.3 %  Eosinophils Automated 0.1 %      Basophils Automated 0.1 %      Immature Granulocyte 0.4 %      Nucleated RBC 0.0 /100 WBC      Neutrophils Absolute 12.20 (H) x10 3/uL      Abs Lymph Automated 1.43 x10 3/uL      Abs Mono Automated 0.93 (H) x10 3/uL      Abs Eos Automated 0.01 x10 3/uL      Absolute Baso Automated 0.02 x10 3/uL      Absolute Immature Granulocyte 0.06 x10 3/uL      Absolute NRBC 0.00 x10 3/uL     Narrative:       While on Magnesium.    Hemolysis index [[id number]] Collected:  04/14/18 0505     Updated:  04/14/18 0620     Hemolysis Index 12    Narrative:       While on Magnesium.    L&D Magnesium - Magnesium (at end of MGS04 bolus and Q6hrs thereafter)  critical result will only be called for Mg level greater than 10mg /dl [[id number]]  (Abnormal) Collected:   04/14/18 0505     Updated:  04/14/18 6213     L&D Magnesium 4.7 (H) mg/dL     Narrative:       While on Magnesium.    Syphilis Screen IgG and IgM [[id number]] Collected:  04/13/18 2230     Updated:  04/14/18 0357     Syphilis Screen IgG and IgM Nonreactive    Hepatitis B surface antigen (HBsAg) [[id number]] Collected:  04/13/18 2230    Specimen:  Blood Updated:  04/14/18 0216     Hepatitis B Surface AG Non-Reactive    Hepatitis C (HCV) antibody, Total [[id number]] Collected:  04/13/18 2230    Specimen:  Blood Updated:  04/14/18 0216     Hepatitis C, AB Non-Reactive    Hemolysis index [[id number]] Collected:  04/13/18 2230     Updated:  04/14/18 0147     Hemolysis Index 12    Protein / creatinine ratio, urine [[id number]]  (Abnormal) Collected:  04/14/18 0008    Specimen:  Urine Updated:  04/14/18 0053     Urine Protein Random 63.1 (H) mg/dL      Urine Creatinine, Random 162.7 mg/dL      Urine Protein/Creatinine Ratio 0.4    HIV RAPID [[id number]] Collected:  04/13/18 2230     Updated:  04/13/18 2342     Rapid HIV-1 p24 Antigen Non-Reactive     Rapid HIV-1/2  Antibody Non-Reactive    Uric acid [[id number]] Collected:  04/13/18 2247    Specimen:  Blood Updated:  04/13/18 2325     Uric acid 5.0 mg/dL     Hemolysis index [[id number]] Collected:  04/13/18 2247     Updated:  04/13/18 2325     Hemolysis Index 4    GFR [[id number]] Collected:  04/13/18 2247     Updated:  04/13/18 2325     EGFR >60.0    Lactate dehydrogenase [[id number]] Collected:  04/13/18 2247    Specimen:  Blood Updated:  04/13/18 2325     LDH 244 U/L     Creatinine, serum [[id number]] Collected:  04/13/18 2247    Specimen:  Blood Updated:  04/13/18 2325     Creatinine 0.8 mg/dL     AST [[id number]]  (Abnormal) Collected:  04/13/18 2247    Specimen:  Blood Updated:  04/13/18 2325     AST (SGOT) 76 (H) U/L  ALT [[id number]]  (Abnormal) Collected:  04/13/18 2247    Specimen:  Blood Updated:   04/13/18 2325     ALT 98 (H) U/L      Results     Procedure Component Value Units Date/Time    Rubella virus IgG antibody titer [[id number]] Collected:  04/13/18 2230    Specimen:  Blood Updated:  04/14/18 1310     Rubella AB, IgG 2.67    ALT [[id number]]  (Abnormal) Collected:  04/14/18 0759    Specimen:  Blood Updated:  04/14/18 0827     ALT 87 (H) U/L     Narrative:       While on Magnesium.    AST [[id number]]  (Abnormal) Collected:  04/14/18 0759    Specimen:  Blood Updated:  04/14/18 0827     AST (SGOT) 85 (H) U/L     Narrative:       While on Magnesium.    Creatinine, serum [[id number]] Collected:  04/14/18 0759    Specimen:  Blood Updated:  04/14/18 0827     Creatinine 0.8 mg/dL     Narrative:       While on Magnesium.    L&D Magnesium - Magnesium (at end of MGS04 bolus and Q6hrs thereafter)  critical result will only be called for Mg level greater than 10mg /dl [[id number]]  (Abnormal) Collected:  04/14/18 0759     Updated:  04/14/18 0827     L&D Magnesium 5.4 (H) mg/dL     Narrative:       While on Magnesium.    Hemolysis index [[id number]] Collected:  04/14/18 0759     Updated:  04/14/18 0827     Hemolysis Index 0    Narrative:       While on Magnesium.    GFR [[id number]] Collected:  04/14/18 0759     Updated:  04/14/18 0827     EGFR >60.0    Narrative:       While on Magnesium.    CBC and differential (Q12H while receiving Magnesium drip) [[id number]]  (Abnormal) Collected:  04/14/18 0759    Specimen:  Blood from Blood Updated:  04/14/18 0818     WBC 14.65 (H) x10 3/uL      Hgb 9.3 (L) g/dL      Hematocrit 62.6 (L) %      Platelets 372 (H) x10 3/uL      RBC 3.08 (L) x10 6/uL      MCV 92.5 fL      MCH 30.2 pg      MCHC 32.6 g/dL      RDW 13 %      MPV 9.4 fL      Neutrophils 83.3 %      Lymphocytes Automated 9.8 %      Monocytes 6.3 %      Eosinophils Automated 0.1 %      Basophils Automated 0.1 %      Immature  Granulocyte 0.4 %      Nucleated RBC 0.0 /100 WBC      Neutrophils Absolute 12.20 (H) x10 3/uL      Abs Lymph Automated 1.43 x10 3/uL      Abs Mono Automated 0.93 (H) x10 3/uL      Abs Eos Automated 0.01 x10 3/uL      Absolute Baso Automated 0.02 x10 3/uL      Absolute Immature Granulocyte 0.06 x10 3/uL      Absolute NRBC 0.00 x10 3/uL  Narrative:       While on Magnesium.    Hemolysis index [[id number]] Collected:  04/14/18 0505     Updated:  04/14/18 0620     Hemolysis Index 12    Narrative:       While on Magnesium.    L&D Magnesium - Magnesium (at end of MGS04 bolus and Q6hrs thereafter)  critical result will only be called for Mg level greater than 10mg /dl [[id number]]  (Abnormal) Collected:  04/14/18 0505     Updated:  04/14/18 6010     L&D Magnesium 4.7 (H) mg/dL     Narrative:       While on Magnesium.    Syphilis Screen IgG and IgM [[id number]] Collected:  04/13/18 2230     Updated:  04/14/18 0357     Syphilis Screen IgG and IgM Nonreactive    Hepatitis B surface antigen (HBsAg) [[id number]] Collected:  04/13/18 2230    Specimen:  Blood Updated:  04/14/18 0216     Hepatitis B Surface AG Non-Reactive    Hepatitis C (HCV) antibody, Total [[id number]] Collected:  04/13/18 2230    Specimen:  Blood Updated:  04/14/18 0216     Hepatitis C, AB Non-Reactive    Hemolysis index [[id number]] Collected:  04/13/18 2230     Updated:  04/14/18 0147     Hemolysis Index 12    Protein / creatinine ratio, urine [[id number]]  (Abnormal) Collected:  04/14/18 0008    Specimen:  Urine Updated:  04/14/18 0053     Urine Protein Random 63.1 (H) mg/dL      Urine Creatinine, Random 162.7 mg/dL      Urine Protein/Creatinine Ratio 0.4    HIV RAPID [[id number]] Collected:  04/13/18 2230     Updated:  04/13/18 2342     Rapid HIV-1 p24 Antigen Non-Reactive     Rapid HIV-1/2  Antibody Non-Reactive    Uric acid [[id number]] Collected:  04/13/18 2247    Specimen:  Blood  Updated:  04/13/18 2325     Uric acid 5.0 mg/dL     Hemolysis index [[id number]] Collected:  04/13/18 2247     Updated:  04/13/18 2325     Hemolysis Index 4    GFR [[id number]] Collected:  04/13/18 2247     Updated:  04/13/18 2325     EGFR >60.0    Lactate dehydrogenase [[id number]] Collected:  04/13/18 2247    Specimen:  Blood Updated:  04/13/18 2325     LDH 244 U/L     Creatinine, serum [[id number]] Collected:  04/13/18 2247    Specimen:  Blood Updated:  04/13/18 2325     Creatinine 0.8 mg/dL     AST [[id number]]  (Abnormal) Collected:  04/13/18 2247    Specimen:  Blood Updated:  04/13/18 2325     AST (SGOT) 76 (H) U/L     ALT [[id number]]  (Abnormal) Collected:  04/13/18 2247    Specimen:  Blood Updated:  04/13/18 2325     ALT 98 (H) U/L      Submit payer.  Pershing Proud MSN RN ACM   Utilization Review Nurse, Case Management  [facility]  [phone]  (hospital staff only)         Please submit all clinical review requests via fax to [phone].

## 2018-04-15 LAB — CBC AND DIFFERENTIAL
Absolute NRBC: 0 10*3/uL (ref 0.00–0.00)
Basophils Absolute Automated: 0 10*3/uL (ref 0.00–0.08)
Basophils Automated: 0 %
Eosinophils Absolute Automated: 0.04 10*3/uL (ref 0.00–0.44)
Eosinophils Automated: 0.4 %
Hematocrit: 21.2 % — ABNORMAL LOW (ref 34.7–43.7)
Hgb: 7.1 g/dL — ABNORMAL LOW (ref 11.4–14.8)
Immature Granulocytes Absolute: 0.05 10*3/uL (ref 0.00–0.07)
Immature Granulocytes: 0.5 %
Lymphocytes Absolute Automated: 1.24 10*3/uL (ref 0.42–3.22)
Lymphocytes Automated: 13.1 %
MCH: 30.5 pg (ref 25.1–33.5)
MCHC: 33.5 g/dL (ref 31.5–35.8)
MCV: 91 fL (ref 78.0–96.0)
MPV: 9.6 fL (ref 8.9–12.5)
Monocytes Absolute Automated: 0.49 10*3/uL (ref 0.21–0.85)
Monocytes: 5.2 %
Neutrophils Absolute: 7.66 10*3/uL — ABNORMAL HIGH (ref 1.10–6.33)
Neutrophils: 80.8 %
Nucleated RBC: 0 /100 WBC (ref 0.0–0.0)
Platelets: 344 10*3/uL (ref 142–346)
RBC: 2.33 10*6/uL — ABNORMAL LOW (ref 3.90–5.10)
RDW: 13 % (ref 11–15)
WBC: 9.48 10*3/uL (ref 3.10–9.50)

## 2018-04-15 MED ORDER — SODIUM CHLORIDE 0.9 % IJ SOLN
3.0000 mL | Freq: Three times a day (TID) | INTRAMUSCULAR | Status: DC
Start: 2018-04-16 — End: 2018-04-16

## 2018-04-15 MED ORDER — ONDANSETRON HCL 4 MG/2ML IJ SOLN
4.0000 mg | Freq: Three times a day (TID) | INTRAMUSCULAR | Status: DC | PRN
Start: 2018-04-15 — End: 2018-04-16

## 2018-04-15 MED ORDER — PROMETHAZINE HCL 25 MG/ML IJ SOLN
6.2500 mg | Freq: Four times a day (QID) | INTRAMUSCULAR | Status: DC | PRN
Start: 2018-04-15 — End: 2018-04-16

## 2018-04-15 MED ORDER — MISOPROSTOL 200 MCG PO TABS
800.0000 ug | ORAL_TABLET | Freq: Once | ORAL | Status: DC | PRN
Start: 2018-04-15 — End: 2018-04-16

## 2018-04-15 MED ORDER — ONDANSETRON 4 MG PO TBDP
4.0000 mg | ORAL_TABLET | Freq: Three times a day (TID) | ORAL | Status: DC | PRN
Start: 2018-04-15 — End: 2018-04-16

## 2018-04-15 MED ORDER — HYDROCORTISONE 1 % EX OINT
TOPICAL_OINTMENT | Freq: Three times a day (TID) | CUTANEOUS | Status: DC | PRN
Start: 2018-04-15 — End: 2018-04-16

## 2018-04-15 MED ORDER — LACTATED RINGERS IV SOLN
125.0000 mL/h | INTRAVENOUS | Status: AC
Start: 2018-04-15 — End: 2018-04-16

## 2018-04-15 MED ORDER — OXYTOCIN-SODIUM CHLORIDE 30-0.9 UT/500ML-% IV SOLN
7.5000 [IU]/h | INTRAVENOUS | Status: AC
Start: 2018-04-15 — End: 2018-04-15

## 2018-04-15 MED ORDER — IBUPROFEN 600 MG PO TABS
600.00 mg | ORAL_TABLET | Freq: Four times a day (QID) | ORAL | Status: AC | PRN
Start: 2018-04-15 — End: ?

## 2018-04-15 MED ORDER — WITCH HAZEL-GLYCERIN EX PADS
1.0000 | MEDICATED_PAD | CUTANEOUS | Status: DC | PRN
Start: 2018-04-15 — End: 2018-04-16

## 2018-04-15 MED ORDER — TETANUS-DIPHTH-ACELL PERTUSSIS 5-2.5-18.5 LF-MCG/0.5 IM SUSP
0.5000 mL | INTRAMUSCULAR | Status: DC | PRN
Start: 2018-04-15 — End: 2018-04-16

## 2018-04-15 MED ORDER — OXYCODONE-ACETAMINOPHEN 5-325 MG PO TABS
1.00 | ORAL_TABLET | ORAL | 0 refills | Status: AC | PRN
Start: 2018-04-15 — End: 2018-04-22

## 2018-04-15 MED ORDER — DOCUSATE SODIUM 100 MG PO CAPS
200.0000 mg | ORAL_CAPSULE | Freq: Two times a day (BID) | ORAL | Status: DC
Start: 2018-04-15 — End: 2018-04-16
  Administered 2018-04-15 – 2018-04-16 (×3): 200 mg via ORAL
  Filled 2018-04-15 (×3): qty 2

## 2018-04-15 MED ORDER — SENNOSIDES-DOCUSATE SODIUM 8.6-50 MG PO TABS
1.0000 | ORAL_TABLET | Freq: Every evening | ORAL | Status: DC | PRN
Start: 2018-04-15 — End: 2018-04-16

## 2018-04-15 MED ORDER — BENZOCAINE 20% +/- MENTHOL 0.5% EX AERO (WRAP)
1.0000 | INHALATION_SPRAY | CUTANEOUS | Status: DC | PRN
Start: 2018-04-15 — End: 2018-04-16

## 2018-04-15 MED ORDER — PRENATAL PLUS IRON 29-1 MG PO TABS
1.0000 | ORAL_TABLET | Freq: Every day | ORAL | Status: DC
Start: 2018-04-15 — End: 2018-04-16
  Administered 2018-04-15 – 2018-04-16 (×2): 1 via ORAL
  Filled 2018-04-15 (×2): qty 1

## 2018-04-15 MED ORDER — NALOXONE HCL 0.4 MG/ML IJ SOLN (WRAP)
0.2000 mg | INTRAMUSCULAR | Status: DC | PRN
Start: 2018-04-15 — End: 2018-04-16

## 2018-04-15 MED ORDER — BISACODYL 10 MG RE SUPP
10.0000 mg | Freq: Every day | RECTAL | Status: DC | PRN
Start: 2018-04-15 — End: 2018-04-16

## 2018-04-15 MED ORDER — PROMETHAZINE HCL 12.5 MG PO TABS
12.5000 mg | ORAL_TABLET | Freq: Four times a day (QID) | ORAL | Status: DC | PRN
Start: 2018-04-15 — End: 2018-04-16

## 2018-04-15 MED ORDER — PROMETHAZINE HCL 25 MG RE SUPP
12.5000 mg | Freq: Four times a day (QID) | RECTAL | Status: DC | PRN
Start: 2018-04-15 — End: 2018-04-16

## 2018-04-15 MED ORDER — MAGNESIUM HYDROXIDE 400 MG/5ML PO SUSP
30.0000 mL | Freq: Four times a day (QID) | ORAL | Status: DC | PRN
Start: 2018-04-15 — End: 2018-04-16

## 2018-04-15 MED ORDER — SIMETHICONE 80 MG PO CHEW
80.0000 mg | CHEWABLE_TABLET | Freq: Four times a day (QID) | ORAL | Status: DC | PRN
Start: 2018-04-15 — End: 2018-04-16
  Administered 2018-04-15: 17:00:00 80 mg via ORAL
  Filled 2018-04-15: qty 1

## 2018-04-15 MED ORDER — LANOLIN EX OINT
TOPICAL_OINTMENT | CUTANEOUS | Status: DC | PRN
Start: 2018-04-15 — End: 2018-04-16

## 2018-04-15 NOTE — Progress Notes (Signed)
Magnesium turned off at 0210.

## 2018-04-15 NOTE — Plan of Care (Signed)
Problem: Vaginal/Cesarean Delivery  Goal: Postpartum management of pain/discomfort  Outcome: Progressing   04/15/18 0335   Goal/Interventions addressed this shift   Postpartum management of pain/discomfort Assess pain level before and following intervention;Include patient/patient care companion in decisions related to pain management     Goal: Breasts are soft with nipple integrity intact  Outcome: Progressing   04/15/18 0335   Goal/Interventions addressed this shift   Breasts are soft with nipple integrity intact Perform breast/nipple assessment;Breastfeed and/or pump breasts at least 8-12 times within 24 hours     Goal: Gastrointestinal/Urinary management  Outcome: Progressing   04/15/18 0335   Goal/Interventions addressed this shift   Gastrointestinal/Urinary management Maintain urine output of 30/mL per hour or greater;Adequate bladder emptying per phase of care     Goal: Uterine management  Outcome: Progressing   04/15/18 0335   Goal/Interventions addressed this shift   Uterine management Assess fundus and notify LIP if not firm, midline, or at or below the umbilicus, or if abdomen is abnormally distended     Goal: Incision will be clean, dry, and intact and without discharge or hematoma  Outcome: Progressing   04/15/18 0335   Goal/Interventions addressed this shift   Incision will be clean, dry and intact and without discharge or hematoma Assess abdominal dressing and incision       Comments: Received from L&D via wheelchair.  Pt is repeat c/s and status post mag.  Oriented to room.  Discussed plan of care I.e. 4hrly blood pressures, I&Os, DTRs.  Pt asymptomatic; no headache, blurry vision or epigastric pain.  Fundus is firm at umbi. Vaginal bleeding is scant.  Due to void.  Knows she must call for assistance to go to bathroom twice.  Passing flatus.  Saline locked. Regular diet now. Offered to bring baby to room from well baby nursery; pt declined at present. Verbalized understanding of care plan discussed.  Will continue to monitor.

## 2018-04-15 NOTE — Plan of Care (Signed)
Problem: Vaginal/Cesarean Delivery  Goal: Gastrointestinal/Urinary management  Outcome: Progressing

## 2018-04-15 NOTE — Progress Notes (Signed)
OBSTETRICS - CESAREAN SECTION POST-OP  [facility]  Brandy Jennings and Floris  04/15/2018 7:08 AM    Patient without complaint. Normal post-op pain well controlled with PO pain meds. Normal lochia. Ambulating in the room and to and from the bathroom. Breastfeeding well. Ate a small meal and tolerated it well. Good appetite. No HA or visual changes.    Physical Exam:   Temp:  [97.1 F (36.2 C)-98.1 F (36.7 C)] 98.1 F (36.7 C)  Heart Rate:  [70-100] 70  Resp Rate:  [15-18] 18  BP: (99-131)/(53-73) 110/69    Intake/Output Summary (Last 24 hours) at 04/15/18 0708  Last data filed at 04/15/18 0650   Gross per 24 hour   Intake             2860 ml   Output             3530 ml   Net             -670 ml     Patient Vitals for the past 24 hrs:   BP Temp Temp src Pulse Resp   04/15/18 0321 110/69 98.1 F (36.7 C) - 70 18   04/15/18 0015 100/62 - - 76 -   04/14/18 2320 107/53 - - 100 -   04/14/18 2220 108/63 - - 99 -   04/14/18 2215 112/57 - - 89 -   04/14/18 2020 119/73 - - 85 -   04/14/18 2015 - 97.3 F (36.3 C) Axillary - 16   04/14/18 1915 110/70 - - 80 -   04/14/18 1815 111/66 - - 75 -   04/14/18 1800 114/63 - - 76 -   04/14/18 1515 104/62 - - 77 -   04/14/18 1415 99/63 - - 88 -   04/14/18 1315 112/59 - - 82 -   04/14/18 1215 102/58 97.7 F (36.5 C) Axillary 81 -   04/14/18 1115 107/55 - - 85 -   04/14/18 1015 110/55 - - 82 -   04/14/18 0915 131/63 - - 80 16   04/14/18 0815 104/62 - - 80 15       Gen: appears comfortable  Abd: non-distended, appropriately tender, uterus below umbilicus and firm  Incision: dressing clean, dry and intact.  Ext: no cyanosis or erythema, no LE edema, calves pain-free, symmetric and nontender.    Labs:     Results     Procedure Component Value Units Date/Time    CBC with differential in AM POD #1 [[id number]]  (Abnormal) Collected:  04/15/18 0600    Specimen:  Blood from Blood Updated:  04/15/18 0624     WBC 9.48 x10 3/uL      Hgb 7.1 (L) g/dL      Hematocrit 40.9 (L) %       Platelets 344 x10 3/uL      RBC 2.33 (L) x10 6/uL      MCV 91.0 fL      MCH 30.5 pg      MCHC 33.5 g/dL      RDW 13 %      MPV 9.6 fL      Neutrophils 80.8 %      Lymphocytes Automated 13.1 %      Monocytes 5.2 %      Eosinophils Automated 0.4 %      Basophils Automated 0.0 %      Immature Granulocyte 0.5 %      Nucleated RBC 0.0 /100 WBC  Neutrophils Absolute 7.66 (H) x10 3/uL      Abs Lymph Automated 1.24 x10 3/uL      Abs Mono Automated 0.49 x10 3/uL      Abs Eos Automated 0.04 x10 3/uL      Absolute Baso Automated 0.00 x10 3/uL      Absolute Immature Granulocyte 0.05 x10 3/uL      Absolute NRBC 0.00 x10 3/uL     Narrative:       While on Magnesium.    Rh Immune Globulin Product [[id number]] Collected:  04/14/18 1600     Updated:  04/14/18 2125     Rh Imm Globulin [id number]         issued       1 vial    Fetal Blood Screen [[id number]] Collected:  04/14/18 1645    Specimen:  Blood Updated:  04/14/18 1904     Fetal Bleed Screen NEG    Rubella virus IgG antibody titer [[id number]] Collected:  04/13/18 2230    Specimen:  Blood Updated:  04/14/18 1310     Rubella AB, IgG 2.67    ALT [[id number]]  (Abnormal) Collected:  04/14/18 0759    Specimen:  Blood Updated:  04/14/18 0827     ALT 87 (H) U/L     Narrative:       While on Magnesium.    AST [[id number]]  (Abnormal) Collected:  04/14/18 0759    Specimen:  Blood Updated:  04/14/18 0827     AST (SGOT) 85 (H) U/L     Narrative:       While on Magnesium.    Creatinine, serum [[id number]] Collected:  04/14/18 0759    Specimen:  Blood Updated:  04/14/18 0827     Creatinine 0.8 mg/dL     Narrative:       While on Magnesium.    L&D Magnesium - Magnesium (at end of MGS04 bolus and Q6hrs thereafter)  critical result will only be called for Mg level greater than 10mg /dl [[id number]]  (Abnormal) Collected:  04/14/18 0759     Updated:  04/14/18 0827     L&D Magnesium 5.4 (H) mg/dL     Narrative:       While on Magnesium.    Hemolysis index [[id number]] Collected:  04/14/18 0759      Updated:  04/14/18 0827     Hemolysis Index 0    Narrative:       While on Magnesium.    GFR [[id number]] Collected:  04/14/18 0759     Updated:  04/14/18 0827     EGFR >60.0    Narrative:       While on Magnesium.    CBC and differential (Q12H while receiving Magnesium drip) [[id number]]  (Abnormal) Collected:  04/14/18 0759    Specimen:  Blood from Blood Updated:  04/14/18 0818     WBC 14.65 (H) x10 3/uL      Hgb 9.3 (L) g/dL      Hematocrit 42.7 (L) %      Platelets 372 (H) x10 3/uL      RBC 3.08 (L) x10 6/uL      MCV 92.5 fL      MCH 30.2 pg      MCHC 32.6 g/dL      RDW 13 %      MPV 9.4 fL      Neutrophils 83.3 %      Lymphocytes Automated 9.8 %      Monocytes 6.3 %  Eosinophils Automated 0.1 %      Basophils Automated 0.1 %      Immature Granulocyte 0.4 %      Nucleated RBC 0.0 /100 WBC      Neutrophils Absolute 12.20 (H) x10 3/uL      Abs Lymph Automated 1.43 x10 3/uL      Abs Mono Automated 0.93 (H) x10 3/uL      Abs Eos Automated 0.01 x10 3/uL      Absolute Baso Automated 0.02 x10 3/uL      Absolute Immature Granulocyte 0.06 x10 3/uL      Absolute NRBC 0.00 x10 3/uL     Narrative:       While on Magnesium.    Hemolysis index [[id number]] Collected:  04/14/18 0505     Updated:  04/14/18 0620     Hemolysis Index 12    Narrative:       While on Magnesium.    L&D Magnesium - Magnesium (at end of MGS04 bolus and Q6hrs thereafter)  critical result will only be called for Mg level greater than 10mg /dl [[id number]]  (Abnormal) Collected:  04/14/18 0505     Updated:  04/14/18 3086     L&D Magnesium 4.7 (H) mg/dL     Narrative:       While on Magnesium.    Syphilis Screen IgG and IgM [[id number]] Collected:  04/13/18 2230     Updated:  04/14/18 0357     Syphilis Screen IgG and IgM Nonreactive    Hepatitis B surface antigen (HBsAg) [[id number]] Collected:  04/13/18 2230    Specimen:  Blood Updated:  04/14/18 0216     Hepatitis B Surface AG Non-Reactive    Hepatitis C (HCV) antibody, Total [[id number]] Collected:   04/13/18 2230    Specimen:  Blood Updated:  04/14/18 0216     Hepatitis C, AB Non-Reactive    Hemolysis index [[id number]] Collected:  04/13/18 2230     Updated:  04/14/18 0147     Hemolysis Index 12    Protein / creatinine ratio, urine [[id number]]  (Abnormal) Collected:  04/14/18 0008    Specimen:  Urine Updated:  04/14/18 0053     Urine Protein Random 63.1 (H) mg/dL      Urine Creatinine, Random 162.7 mg/dL      Urine Protein/Creatinine Ratio 0.4    HIV RAPID [[id number]] Collected:  04/13/18 2230     Updated:  04/13/18 2342     Rapid HIV-1 p24 Antigen Non-Reactive     Rapid HIV-1/2  Antibody Non-Reactive    Uric acid [[id number]] Collected:  04/13/18 2247    Specimen:  Blood Updated:  04/13/18 2325     Uric acid 5.0 mg/dL     Hemolysis index [[id number]] Collected:  04/13/18 2247     Updated:  04/13/18 2325     Hemolysis Index 4    GFR [[id number]] Collected:  04/13/18 2247     Updated:  04/13/18 2325     EGFR >60.0    Lactate dehydrogenase [[id number]] Collected:  04/13/18 2247    Specimen:  Blood Updated:  04/13/18 2325     LDH 244 U/L     Creatinine, serum [[id number]] Collected:  04/13/18 2247    Specimen:  Blood Updated:  04/13/18 2325     Creatinine 0.8 mg/dL     AST [[id number]]  (Abnormal) Collected:  04/13/18 2247    Specimen:  Blood Updated:  04/13/18 2325     AST (SGOT) 76 (H) U/L  ALT [[id number]]  (Abnormal) Collected:  04/13/18 2247    Specimen:  Blood Updated:  04/13/18 2325     ALT 98 (H) U/L     Type and screen [[id number]] Collected:  04/13/18 2210    Specimen:  Blood Updated:  04/13/18 2301     ABO Rh O NEG     AB Screen Gel NEG    Protein / creatinine ratio, urine [[id number]] Collected:  04/13/18 2247    Specimen:  Urine Updated:  04/13/18 2247    CBC without differential [[id number]] Collected:  04/13/18 2247    Specimen:  Blood from Blood Updated:  04/13/18 2247    CBC and differential [[id number]] Collected:  04/13/18 2230    Specimen:  Blood from Blood Updated:  04/13/18 2230    GROUP B STREP  INTRAPARTUM [[id number]] Collected:  04/13/18 2230    Specimen:  Vaginal Swab Updated:  04/13/18 2230    CBC without differential [[id number]]  (Abnormal) Collected:  04/13/18 2210    Specimen:  Blood from Blood Updated:  04/13/18 2219     WBC 8.89 x10 3/uL      Hgb 12.3 g/dL      Hematocrit 74.2 %      Platelets 454 (H) x10 3/uL      RBC 4.23 x10 6/uL      MCV 90.1 fL      MCH 29.1 pg      MCHC 32.3 g/dL      RDW 13 %      MPV 9.6 fL      Nucleated RBC 0.0 /100 WBC      Absolute NRBC 0.00 x10 3/uL          Assessment and Plan:   1. POD #  2 s/p C/S - doing well. Anemia noted - at d/c to home will recommend PO FeSO4. Will continue to follow VS and watch for symptoms of severe anemia or evidence of continued blood loss. Doing well s/p Mg SO4 seaizre prophylaxis.  2. Continue routine post operative care. Start FeSO PO BID after d/c for anemia. BPs now ok, will observe and initiate treatment id indicated.  3. Discharge to home tomorrow is likely with routine followup, precautions, analgesia and instructions.    Mariel Kansky, MD

## 2018-04-15 NOTE — Plan of Care (Addendum)
Problem: Vaginal/Cesarean Delivery  Goal: Postpartum management of pain/discomfort  Outcome: Progressing   04/15/18 2040   Goal/Interventions addressed this shift   Postpartum management of pain/discomfort Assess pain using a consistent, developmental/age appropriate pain scale;Assess pain level before and following intervention       Comments: Pt is stable, BP slightly elevated at 143/87. Will continue to monitor. At Pt request without medical indication, this RN set up pump for Pt (post mag) and provided eduction on usage.

## 2018-04-16 LAB — CBC
Absolute NRBC: 0.07 10*3/uL — ABNORMAL HIGH (ref 0.00–0.00)
Hematocrit: 22.2 % — ABNORMAL LOW (ref 34.7–43.7)
Hgb: 7.1 g/dL — ABNORMAL LOW (ref 11.4–14.8)
MCH: 29.8 pg (ref 25.1–33.5)
MCHC: 32 g/dL (ref 31.5–35.8)
MCV: 93.3 fL (ref 78.0–96.0)
MPV: 9.5 fL (ref 8.9–12.5)
Nucleated RBC: 0.6 /100 WBC — ABNORMAL HIGH (ref 0.0–0.0)
Platelets: 347 10*3/uL — ABNORMAL HIGH (ref 142–346)
RBC: 2.38 10*6/uL — ABNORMAL LOW (ref 3.90–5.10)
RDW: 13 % (ref 11–15)
WBC: 12.24 10*3/uL — ABNORMAL HIGH (ref 3.10–9.50)

## 2018-04-16 NOTE — Plan of Care (Signed)
Call the office to schedule your follow up appointment.  Information on how to take care of yourself after baby can be found in the New Mother information care booklet.  Please refer to the back page for a list of reason to call your doctor.  If you have any additional questions or concerns please contact your OB.

## 2018-04-16 NOTE — Progress Notes (Signed)
SW consulted for reason: "mom with difficulty affording car seat".    Writer met with MOB at bedside, SW role explained.  Infant's name is Baron Sane, she is MOB's 2nd child.  1st child is 29 year old.      MOB reported that she is unable to obtain a car seat.  She stated she had signed up for the car seat class at [facility], however the day of the class her water broke.  MOB stated she also contacted [facility] prior to delivering, however they did not have any available car seats when MOB called.  [facility] is not open on Fridays to determine if they have an available car seat today, and MOB and baby are to discharge today.    MOB stated she will try to sign-up for the next car seat class at [facility], which is next month. Writer offered to provide MOB with a donated car seat, and if she is able to obtain another car seat through [facility], to return car seat provided today to hospital - MOB in agreement.  Writer delivered car seat to MOB's room - MOB appreciative.  MOB stated she has a bassinet, clothing and diapers at home.  MOB is already established with WIC, and plans to add infant ASAP.      MOB reported having good support from family and friends.  FOB is not involved.    Pediatrician is [facility], follow-up appointment scheduled for Monday.    MOB and baby cleared to [facility] from SW perspective.    Chinita Greenland, MSW  x: [phone]

## 2018-04-16 NOTE — Discharge Summary (Signed)
6/7/20198:16 AM    Patient without complaints.  Pain well controlled and tolerating regular diet.  staining only    BP 134/81   Pulse 82   Temp 97 F (36.1 C) (Oral)   Resp 16   Ht 1.626 m (5\' 4" )   Wt 82.1 kg (181 lb)   SpO2 98%   Breastfeeding? Yes   BMI 31.07 kg/m     Abd:  Soft, appropriately tender, ND, fundus firm, BS active  Inc:  clean, dry and intact with no problems      Recent Labs  Lab 04/16/18  0600 04/15/18  0600 04/14/18  0759   WBC 12.24* 9.48 14.65*   Hgb 7.1* 7.1* 9.3*   Hematocrit 22.2* 21.2* 28.5*   Platelets 347* 344 372*       Recent Labs  Lab 04/14/18  0759 04/13/18  2247   Creatinine 0.8 0.8   ALT 87* 98*   AST (SGOT) 85* 76*       Assessment: 3 Days Post-Op  BPs have remained normal and post op course uncomplicated    Plan: Continue care.  Discharge home today.  Follow in  4 days for staple removal  Take PNV and FeSo4 BID

## 2018-04-16 NOTE — Lactation Note (Signed)
Follow-up lactation consultant visit at bedside for infant feeding plan. Pt states she has not pumped overnight, infant seems not to tolerate formula because infant spits up and is gassy, and concerned will develop engorgement. Pt states will be discharged today. Infant feeding at breast using cross cradle position but shallow and self repositioning. Offered to assist with football position, mother agreeable; pillows positioned for mother's comfort, infant placed in football position with deep latch, audible swallows, long jaw movement. Mother denies nipple pain with latch. Informed how to prevent engorgement by feeding infant on demand and not offering formula, if does offer formula to pump or hand express. Discussed verbally and shared written information on treating engorgement. Encouraged to call lactation center for any lactation needs outpatient, informed of latch clinic and [place] moms groups.

## 2018-04-20 ENCOUNTER — Observation Stay
Admission: RE | Admit: 2018-04-20 | Discharge: 2018-04-20 | Disposition: A | Discharge status: Home / Self Care | Payer: No Typology Code available for payment source | Source: Ambulatory Visit | Attending: Obstetrics & Gynecology | Admitting: Obstetrics & Gynecology

## 2018-04-20 DIAGNOSIS — Z4802 Encounter for removal of sutures: Principal | ICD-10-CM | POA: Insufficient documentation

## 2018-04-20 NOTE — Progress Notes (Signed)
Staple removal done by Dr. Laurine Blazer.

## 2018-04-22 NOTE — UM Notes (Signed)
Pt [place] on 04/16/18. Please provide final authorization.    6/7/20198:16 AM    Patient without complaints.  Pain well controlled and tolerating regular diet.  staining only    BP 134/81   Pulse 82   Temp 97 F (36.1 C) (Oral)   Resp 16   Ht 1.626 m (5\' 4" )   Wt 82.1 kg (181 lb)   SpO2 98%   Breastfeeding? Yes   BMI 31.07 kg/m     Abd:  Soft, appropriately tender, ND, fundus firm, BS active  Inc:  clean, dry and intact with no problems      Recent Labs  Lab 04/16/18  0600 04/15/18  0600 04/14/18  0759   WBC 12.24* 9.48 14.65*   Hgb 7.1* 7.1* 9.3*   Hematocrit 22.2* 21.2* 28.5*   Platelets 347* 344 372*       Recent Labs  Lab 04/14/18  0759 04/13/18  2247   Creatinine 0.8 0.8   ALT 87* 98*   AST (SGOT) 85* 76*       Assessment: 3 Days Post-Op  BPs have remained normal and post op course uncomplicated    Plan: Continue care.  Discharge home today.  Follow in  4 days for staple removal  Take PNV and FeSo4 BID      Shironda Kain K. Coral Ceo, RN, BSN, MSN, MBA  UR Case Manager  Please Email if you have any questions  [email or web address]

## 2019-02-25 ENCOUNTER — Emergency Department
Admission: EM | Admit: 2019-02-25 | Discharge: 2019-02-25 | Disposition: A | Discharge status: Home / Self Care | Payer: Self-pay | Attending: Emergency Medicine | Admitting: Emergency Medicine

## 2019-02-25 DIAGNOSIS — K219 Gastro-esophageal reflux disease without esophagitis: Secondary | ICD-10-CM | POA: Insufficient documentation

## 2019-02-25 DIAGNOSIS — Z720 Tobacco use: Secondary | ICD-10-CM | POA: Insufficient documentation

## 2019-02-25 DIAGNOSIS — F17200 Nicotine dependence, unspecified, uncomplicated: Secondary | ICD-10-CM

## 2019-02-25 MED ORDER — FAMOTIDINE 20 MG PO TABS
20.00 mg | ORAL_TABLET | Freq: Once | ORAL | Status: AC
Start: 2019-02-25 — End: 2019-02-25
  Administered 2019-02-25: 06:00:00 20 mg via ORAL
  Filled 2019-02-25: qty 1

## 2019-02-25 MED ORDER — ALUM & MAG HYDROXIDE-SIMETH 200-200-20 MG/5ML PO SUSP
30.00 mL | Freq: Once | ORAL | Status: AC
Start: 2019-02-25 — End: 2019-02-25
  Administered 2019-02-25: 06:00:00 30 mL via ORAL
  Filled 2019-02-25: qty 30

## 2019-02-25 NOTE — ED Provider Notes (Addendum)
EMERGENCY DEPARTMENT HISTORY AND PHYSICAL EXAM    PPE: Mask and face shield were worn every time I entered the room.      Physician/Midlevel provider first contact with patient: 02/25/19 0617       History of Presenting Illness:  History Provided By: Patient    Brandy Jennings is a 28 y.o. female pw heartburn times a few days and possible abdominal hernia has a few days.  Patient describes a burning sensation in her epigastric area.  It is worse with eating.  Is worse when lying down.  There is no radiation or migration of pain.  She reports eating greasy food and smoking.  No medications attempted.  No nausea, vomiting, fever or chills.    She also reports a lump just left of her umbilicus.  Is unchanged with Valsalva or sitting up.  She does not recall feeling this before.    She was seen in urgent care a few days ago and was told she denied a pregnancy test, heartburn and a possible abdominal wall hernia.    Reviewed Past Medical History, Surgical History, Family History and Social as documented.    PCP: Pcp, None, MD  SPECIALISTS:    Review of Systems:  Review of Systems   Constitutional: Negative for chills and fever.   Respiratory: Negative for cough and shortness of breath.    Gastrointestinal: Negative for abdominal pain, diarrhea and nausea.   All other systems reviewed as negative.    Physical Exam:  Vitals:    02/25/19 0533   BP: 139/83   Pulse: (!) 115   Resp: 20   Temp: 98.2 F (36.8 C)   TempSrc: Oral   SpO2: 100%   Weight: 68 kg   Height: 5\' 4"  (1.626 m)       Physical Exam   Constitutional: Patient is alert.  Well nourished.  NAD  Head: Atraumatic.   Eyes: EOMI. PERRL  ENT:  MMM.   Neck:  FROM. No spinal tenderness. Neck supple.    Cardiovascular: Normal rate and regular rhythm.   Pulmonary/Chest: Effort normal and breath sounds normal. No respiratory distress.   Abdominal: Soft. There is no tenderness. Bowel sounds present and normal.   No hernia appreciated w/valsalva.  No mass  appreciated.   Musculoskeletal:  No lower extremity edema or tenderness.    Neurological: Patient is alert and oriented to person, place, and time.  No focal deficits.   Skin: Skin is warm and dry.    Old Medical Records: Nursing notes.    Patient Update Notes:       Provider Notes: GERD.  OTC meds prn.  Dietary modification.  No hernia appreciated.  No tenderness.     Clinical Impression:   1. Gastroesophageal reflux disease, esophagitis presence not specified    2. Tobacco use disorder        ED Disposition     ED Disposition Condition Date/Time Comment    Discharge  Fri Feb 25, 2019  6:22 AM Brandy Jennings discharge to home/self care.    Condition at disposition: Stable          Smoking Cessation Counseling: The patient was counseled as to the multiple risks to her health from continued use of tobacco products.  It was explained that continuing to smoke may lead to multiple short and long term negative health consequences, including but not limited to mouth/esophageal/lung cancer, COPD, and heart disease.  She states she understands these risks,  and also understands the options and resources available to her to help her stop smoking.  Nicotine replacement therapy, local hotlines, and local resources were discussed as viable options for helping her stop her tobacco use.  The total time spent counseling the patient regarding tobacco cessation was 5 minutes.      This note was generated by the Epic EMR system/ Dragon speech recognition and may contain inherent errors or omissions not intended by the user. Grammatical errors, random word insertions, deletions and pronoun errors  are occasional consequences of this technology due to software limitations. Not all errors are caught or corrected. If there are questions or concerns about the content of this note or information contained within the body of this dictation they should be addressed directly with the author for clarification          Tenny Craw, MD  02/25/19 [phone]

## 2019-02-25 NOTE — ED Triage Notes (Signed)
Brandy Jennings is a 28 y.o. female c/o GERD and possible umbilical hernia. Pt reports going to the clinic a couple days ago for a pregnancy test bc of heartburn and was told she could have a hernia causing abdominal pain. Pt reports pregnancy test was negative. No palpable mass felt on examination. Pt reports pain in her umbilical region. Pt denies N/V/D. BP 139/83    Pulse (!) 115    Temp 98.2 F (36.8 C) (Oral)    Resp 20    Ht 5\' 4"  (1.626 m)    Wt 68 kg    SpO2 100%    BMI 25.75 kg/m

## 2019-02-25 NOTE — Discharge Instructions (Signed)
Thank you for choosing [facility] for your emergency care needs.   We strive to provide EXCELLENT care to you and your family.      If you do not continue to improve or your condition worsens, please contact your doctor or return immediately to the Emergency Department.    DOCTOR REFERRALS  Call [phone] if you need any further referrals and we can help you find a primary care doctor or specialist.  Also, available online at:  [email or web address]      FREE HEALTH SERVICES  If you need help with health or social services, please call 2-1-1 for a free referral to resources in your area.  2-1-1 is a free service connecting people with information on health insurance, free clinics, pregnancy, mental health, dental care, food assistance, housing, and substance abuse counseling.  Also, available online at:  http://[email or web address].org    MEDICAL RECORDS AND TESTS  Certain laboratory test results do not come back the same day, for example urine cultures.   We will contact you if other important findings are noted.  Radiology films are often reviewed again to ensure accuracy.  If there is any discrepancy, we will notify you.      ORTHOPEDIC INJURY   Please know that significant injuries can exist even when an initial x-ray is read as normal or negative.  This can occur because some fractures (broken bones) are not initially visible on x-rays.  For this reason, close outpatient follow-up with your primary care doctor or bone specialist (orthopedist) is required.    MEDICATIONS AND FOLLOWUP  Please be aware that some prescription medications can cause drowsiness.  Use caution when driving or operating machinery.    BLOOD PRESSURE  If at any time during your visit, the measured blood pressure was greater than 120 systolic (top number) or 80 diastolic (bottom number), please see you primary care physician in 1-2 days.     The examination and treatment you have received in our Emergency  Department is provided on an emergency basis, and is not intended to be a substitute for your primary care physician.  It is important that your doctor checks you again and that you report any new or remaining problems at that time.        Gastroesophageal Reflux Disease, GERD    You have been diagnosed with gastroesophageal reflux disease. This is sometimes called GERD.    With this condition, acid from the stomach backs up into the esophagus (food pipe). Most people have this as occasional heartburn. Sometimes, the backup happens so often that the symptoms are severe (serious). Over time, damage may be done to the esophagus.    Medicines may be prescribed to lower acid secretion in the stomach or coat the esophagus. Take all medicines as directed.    There is no evidence that spicy foods cause reflux. Some people find that avoiding caffeine, alcohol, and spicy and acidic foods lowers discomfort. Just pay attention to what you eat. If a food makes symptoms worse, don't eat it.    Don't lie down after eating.    Don't smoke. Smoking may relax the esophagus muscles that keep acid in the stomach.    Obesity (being overweight) may make reflux symptoms worse. Talk to your doctor about a weight loss plan.    Do not eat less than 2-3 hours before bedtime.    You can buy antacids over the counter (without a prescription). Use them as directed to relieve symptoms.  Follow up as directed. You may be referred to a gastroenterologist (digestive system doctor) for further evaluation.    YOU SHOULD SEEK MEDICAL ATTENTION IMMEDIATELY, EITHER HERE OR AT THE NEAREST EMERGENCY DEPARTMENT, IF ANY OF THE FOLLOWING OCCURS:   Your pain increases or changes.   New or different chest pain.   You are vomiting (throwing up) constantly or vomiting blood or material that looks like "coffee grounds."   Your stool has blood, gets very dark or looks like tar.

## 2019-02-26 ENCOUNTER — Telehealth: Payer: Self-pay

## 2020-06-05 ENCOUNTER — Encounter (HOSPITAL_COMMUNITY): Payer: Self-pay | Admitting: *Deleted

## 2020-06-05 ENCOUNTER — Emergency Department (HOSPITAL_COMMUNITY): Payer: Medicaid Other

## 2020-06-05 ENCOUNTER — Other Ambulatory Visit: Payer: Self-pay

## 2020-06-05 ENCOUNTER — Emergency Department (HOSPITAL_COMMUNITY)
Admission: EM | Admit: 2020-06-05 | Discharge: 2020-06-06 | Disposition: A | Discharge status: Home / Self Care | Payer: Medicaid Other | Attending: Emergency Medicine | Admitting: Emergency Medicine

## 2020-06-05 DIAGNOSIS — I10 Essential (primary) hypertension: Secondary | ICD-10-CM | POA: Diagnosis not present

## 2020-06-05 DIAGNOSIS — N644 Mastodynia: Secondary | ICD-10-CM | POA: Insufficient documentation

## 2020-06-05 DIAGNOSIS — F17210 Nicotine dependence, cigarettes, uncomplicated: Secondary | ICD-10-CM | POA: Insufficient documentation

## 2020-06-05 DIAGNOSIS — M791 Myalgia, unspecified site: Secondary | ICD-10-CM | POA: Insufficient documentation

## 2020-06-05 DIAGNOSIS — R110 Nausea: Secondary | ICD-10-CM | POA: Diagnosis not present

## 2020-06-05 DIAGNOSIS — R0789 Other chest pain: Secondary | ICD-10-CM

## 2020-06-05 DIAGNOSIS — R1013 Epigastric pain: Secondary | ICD-10-CM | POA: Insufficient documentation

## 2020-06-05 DIAGNOSIS — R079 Chest pain, unspecified: Secondary | ICD-10-CM

## 2020-06-05 MED ORDER — SODIUM CHLORIDE 0.9% FLUSH: 3.0000 mL | Freq: Once | INTRAVENOUS | Status: DC

## 2020-06-05 NOTE — ED Triage Notes (Signed)
Pt reporting left sided chest pain for several days, says sometimes the pain also is in her right chest. States some SOB and weakness in the left arm.

## 2020-06-06 LAB — BASIC METABOLIC PANEL
Anion gap: 11 (ref 5–15)
BUN: 6 mg/dL (ref 6–20)
CO2: 26 mmol/L (ref 22–32)
Calcium: 9.3 mg/dL (ref 8.9–10.3)
Chloride: 102 mmol/L (ref 98–111)
Creatinine, Ser: 0.82 mg/dL (ref 0.44–1.00)
GFR calc Af Amer: >60 mL/min (ref >60)
GFR calc non Af Amer: >60 mL/min (ref >60)
Glucose, Bld: 91 mg/dL (ref 70–99)
Potassium: 3.6 mmol/L (ref 3.5–5.1)
Sodium: 139 mmol/L (ref 135–145)

## 2020-06-06 LAB — CBC
HCT: 38.5 % (ref 36.0–46.0)
Hemoglobin: 12.6 g/dL (ref 12.0–15.0)
MCH: 30.7 pg (ref 26.0–34.0)
MCHC: 32.7 g/dL (ref 30.0–36.0)
MCV: 93.7 fL (ref 80.0–100.0)
Platelets: 388 10*3/uL (ref 150–400)
RBC: 4.11 MIL/uL (ref 3.87–5.11)
RDW: 12 % (ref 11.5–15.5)
WBC: 4.8 10*3/uL (ref 4.0–10.5)
nRBC: 0 % (ref 0.0–0.2)

## 2020-06-06 LAB — TROPONIN I (HIGH SENSITIVITY)
Troponin I (High Sensitivity): <2 ng/L (ref <18)
Troponin I (High Sensitivity): <2 ng/L (ref <18)

## 2020-06-06 LAB — I-STAT BETA HCG BLOOD, ED (MC, WL, AP ONLY): I-stat hCG, quantitative: <5 m[IU]/mL (ref <5)

## 2020-06-06 LAB — PREGNANCY, URINE: Preg Test, Ur: NEGATIVE

## 2020-06-06 MED ORDER — IBUPROFEN 400 MG PO TABS: 400.0000 mg | ORAL_TABLET | Freq: Four times a day (QID) | ORAL | 0 refills | Status: DC | PRN

## 2020-06-06 MED ORDER — FAMOTIDINE 20 MG PO TABS: 20.0000 mg | ORAL_TABLET | Freq: Two times a day (BID) | ORAL | 0 refills | Status: DC

## 2020-06-06 NOTE — ED Provider Notes (Signed)
[facility] EMERGENCY DEPARTMENT Provider Note   CSN: [id number] Arrival date & time: 06/05/20  2309     History Chief Complaint  Patient presents with  . Chest Pain    Marilyn Casey is a 29 y.o. female with no significant past medical history presenting emergency department with chest pain, epigastric pain, left arm pain for 2 days.  She reports has been having all the symptoms for about 2 days.  She is most concerned about epigastric pain which is sharp and feels like it is burning at times.  She is radiates around her left breast.  She is also feeling pain radiating down her left shoulder at times.  Sometimes worse with arm movement.  She has never had these symptoms before.  She works as a Mudlogger at [employer] and reports that she does drink a lot of caffeine daily.  She wonders if this may be reflux.  She also reports feeling a very small and painful lump on the medial aspect of her left breast, when she touches this lump, reproduces some of the sharp pain she is feeling.  She denies any vomiting or diarrhea.  She denies any fevers or chills.  She denies any productive cough.  No hemoptysis or asymmetric LE edema. Patient denies personal or family history of DVT or PE. No recent hormone use (including OCP); travel for >6 hours; prolonged immobilization for greater than 3 days; surgeries or trauma in the last 4 weeks; or malignancy with treatment within 6 months.  No personal or family hx sig for MI.  No hx of DM2 or HLD.  She does smoke cigarettes daily.  She reports diet-controlled hypertension.  HPI     History reviewed. No pertinent past medical history.  There are no problems to display for this patient.   Past Surgical History:  Procedure Laterality Date  . ECTOPIC PREGNANCY SURGERY       OB History   No obstetric history on file.     No family history on file.  Social History   Tobacco Use  . Smoking status: Not on file  Substance Use  Topics  . Alcohol use: Not on file  . Drug use: Not on file    Home Medications Prior to Admission medications   Medication Sig Start Date End Date Taking? Authorizing Provider  ibuprofen (ADVIL) 200 MG tablet Take 200-1,000 mg by mouth every 6 (six) hours as needed for headache or moderate pain.   Yes [provider]  famotidine (PEPCID) 20 MG tablet Take 1 tablet (20 mg total) by mouth 2 (two) times daily. 06/06/20 07/06/20  Terald Sleeper, MD  ibuprofen (ADVIL) 400 MG tablet Take 1 tablet (400 mg total) by mouth every 6 (six) hours as needed for up to 30 doses for mild pain or moderate pain. 06/06/20   Terald Sleeper, MD    Allergies    Patient has no known allergies.  Review of Systems   Review of Systems  Constitutional: Negative for chills and fever.  HENT: Negative for ear pain and sore throat.   Eyes: Negative for pain and visual disturbance.  Respiratory: Negative for cough and shortness of breath.   Cardiovascular: Positive for chest pain. Negative for palpitations.  Gastrointestinal: Positive for abdominal pain and nausea. Negative for constipation and diarrhea.  Genitourinary: Negative for dysuria and hematuria.  Musculoskeletal: Positive for myalgias. Negative for back pain.  Skin: Negative for color change and rash.  Neurological: Negative for seizures and  syncope.  Psychiatric/Behavioral: Negative for agitation and confusion.  All other systems reviewed and are negative.   Physical Exam Updated Vital Signs BP 104/67 (BP Location: Right Arm)   Pulse 71   Temp 98.6 F (37 C) (Oral)   Resp 16   SpO2 100%   Physical Exam Vitals and nursing note reviewed.  Constitutional:      General: She is not in acute distress.    Appearance: She is well-developed.  HENT:     Head: Normocephalic and atraumatic.  Eyes:     Conjunctiva/sclera: Conjunctivae normal.  Cardiovascular:     Rate and Rhythm: Normal rate and regular rhythm.     Heart sounds: No  murmur heard.   Pulmonary:     Effort: Pulmonary effort is normal. No respiratory distress.     Breath sounds: Normal breath sounds.  Abdominal:     Palpations: Abdomen is soft.     Tenderness: There is no abdominal tenderness.  Musculoskeletal:        General: Normal range of motion.     Cervical back: Neck supple.     Comments: Left medial breast with small 1 cm soft nodule at 11 o clock position near the sternum, tender to touch  Skin:    General: Skin is warm and dry.  Neurological:     General: No focal deficit present.     Mental Status: She is alert and oriented to person, place, and time.  Psychiatric:        Mood and Affect: Mood normal.        Behavior: Behavior normal.     ED Results / Procedures / Treatments   Labs (all labs ordered are listed, but only abnormal results are displayed) Labs Reviewed  BASIC METABOLIC PANEL  CBC  PREGNANCY, URINE  I-STAT BETA HCG BLOOD, ED ([facility], [facility], [facility] ONLY)  TROPONIN I (HIGH SENSITIVITY)  TROPONIN I (HIGH SENSITIVITY)    EKG EKG Interpretation  Date/Time:  Tuesday June 05 2020 23:28:17 EDT Ventricular Rate:  67 PR Interval:  132 QRS Duration: 80 QT Interval:  396 QTC Calculation: 418 R Axis:   99 Text Interpretation: Normal sinus rhythm Rightward axis Borderline ECG No old tracing to compare Confirmed by Dione Booze ([id number]) on 06/06/2020 12:00:16 AM   Radiology DG Chest 2 View  Result Date: 06/05/2020 CLINICAL DATA:  Chest pain EXAM: CHEST - 2 VIEW COMPARISON:  None. FINDINGS: The heart size and mediastinal contours are within normal limits. Both lungs are clear. The visualized skeletal structures are unremarkable. IMPRESSION: No active cardiopulmonary disease. Electronically Signed   By: Jasmine Pang M.D.   On: 06/05/2020 23:58    Procedures Procedures (including critical care time)  Medications Ordered in ED Medications - No data to display  ED Course  I have reviewed the triage vital signs and the nursing  notes.  Pertinent labs & imaging results that were available during my care of the patient were reviewed by me and considered in my medical decision making (see chart for details).  29 yo female here with sharp left sided chest pain and indigestion sensation for 2 days.  This is most likely reflux given her heavy coffee consumption and location of her epigastric pain.    She has some tenderness of the left breast with a small nodule, a possible lipoma, overlying the rib here.  When I push on this nodule, she feels the sharp pain radiating around her left chest, and it is exactly the pain  she has been experiencing.  I suspect this may be irritating a nerve on her rib.  I advised motrin for a few days (unless it upsets her stomach, then tylenol is fine) and provided f/u information for the women's clinic if there is any change in character for this nodule.  At this point with her workup completed, I have a much lower suspicion for ACS, PE, PTX, PNA, or dissection.  She has a low heart score (<3) with no cardiac risk factors.  PERC negative.  Labs reviewed, trop <2 , BMP and CBC unremarkable, pregnancy negative.  Doubt sepsis or infection.  ECG is NSR and nonischemic per my interpretation DG chest is clear without sign of PNA or PTX per my interpretation  Okay for discharge.     Final Clinical Impression(s) / ED Diagnoses Final diagnoses:  Chest pain, unspecified type  Chest wall pain    Rx / DC Orders ED Discharge Orders         Ordered    famotidine (PEPCID) 20 MG tablet  2 times daily     Discontinue  Reprint     06/06/20 0907    ibuprofen (ADVIL) 400 MG tablet  Every 6 hours PRN     Discontinue  Reprint     06/06/20 0907           Terald Sleeper, MD 06/06/20 [phone]

## 2020-06-06 NOTE — Discharge Instructions (Addendum)
You are having pain in your chest which may be due to a few possibilites.  This may be reflux from the coffee you are drinking.  I prescribed pepcid for this and strongly advise reducing your coffee intake.  You may also have some pain from the small lipoma on your left breast.  I prescribed low-dose motrin (400 mg) every 6-8 hours as needed for this pain.  Try to take this with some food to reduce the chance of an upset stomach. If motrin is upsetting your stomach or worsening your heartburn you can switch to tylenol 650 mg every 6 hours instead.  Your workup today was otherwise reassuring, and did not show signs of heart attack, pneumonia, collapsed lung, or other life-threatening emergencies.  Please continue to routinely check your left breast every 3 days for this small lump.  It is very likely benign. However, if it is changing in any way (growing, becoming red or inflammed, or more painful), or you find other lungs, you should make an appointment at our [facility] clinic or with your doctor for a full breast exam.

## 2021-04-03 ENCOUNTER — Other Ambulatory Visit: Payer: Self-pay

## 2021-04-03 ENCOUNTER — Encounter (HOSPITAL_COMMUNITY): Payer: Self-pay

## 2021-04-03 ENCOUNTER — Inpatient Hospital Stay (HOSPITAL_COMMUNITY)
Admission: EM | Admit: 2021-04-03 | Discharge: 2021-04-03 | Disposition: A | Discharge status: Home / Self Care | Payer: 59 | Attending: Obstetrics & Gynecology | Admitting: Obstetrics & Gynecology

## 2021-04-03 DIAGNOSIS — O26891 Other specified pregnancy related conditions, first trimester: Secondary | ICD-10-CM | POA: Diagnosis present

## 2021-04-03 DIAGNOSIS — Z3A01 Less than 8 weeks gestation of pregnancy: Secondary | ICD-10-CM | POA: Insufficient documentation

## 2021-04-03 DIAGNOSIS — O09291 Supervision of pregnancy with other poor reproductive or obstetric history, first trimester: Secondary | ICD-10-CM | POA: Diagnosis not present

## 2021-04-03 DIAGNOSIS — O3680X0 Pregnancy with inconclusive fetal viability, not applicable or unspecified: Secondary | ICD-10-CM | POA: Insufficient documentation

## 2021-04-03 DIAGNOSIS — O209 Hemorrhage in early pregnancy, unspecified: Secondary | ICD-10-CM | POA: Diagnosis not present

## 2021-04-03 DIAGNOSIS — M549 Dorsalgia, unspecified: Secondary | ICD-10-CM | POA: Diagnosis not present

## 2021-04-03 DIAGNOSIS — R109 Unspecified abdominal pain: Secondary | ICD-10-CM | POA: Insufficient documentation

## 2021-04-03 LAB — CBC
HCT: 40.2 % (ref 36.0–46.0)
Hemoglobin: 13.5 g/dL (ref 12.0–15.0)
MCH: 31.3 pg (ref 26.0–34.0)
MCHC: 33.6 g/dL (ref 30.0–36.0)
MCV: 93.3 fL (ref 80.0–100.0)
Platelets: 438 10*3/uL — ABNORMAL HIGH (ref 150–400)
RBC: 4.31 MIL/uL (ref 3.87–5.11)
RDW: 12.9 % (ref 11.5–15.5)
WBC: 6.4 10*3/uL (ref 4.0–10.5)
nRBC: 0 % (ref 0.0–0.2)

## 2021-04-03 LAB — I-STAT BETA HCG BLOOD, ED (MC, WL, AP ONLY): I-stat hCG, quantitative: 7 m[IU]/mL — ABNORMAL HIGH (ref <5)

## 2021-04-03 LAB — HCG, QUANTITATIVE, PREGNANCY: hCG, Beta Chain, Quant, S: 7 m[IU]/mL — ABNORMAL HIGH (ref <5)

## 2021-04-03 MED ORDER — RHO D IMMUNE GLOBULIN 1500 UNIT/2ML IJ SOSY
300.0000 ug | PREFILLED_SYRINGE | Freq: Once | INTRAMUSCULAR | Status: AC
  Administered 2021-04-03: 300 ug via INTRAMUSCULAR
  Filled 2021-04-03: qty 2

## 2021-04-03 NOTE — MAU Note (Signed)
.  Marilyn Casey is a 30 y.o. at Unknown here in MAU reporting: vaginal spotting that began yesterday. She states her vaginal bleeding increased today around 1000 and she began to pass quarter sized clots. Endorses upper and lower abdominal pain. States she "soaks the middle part of the pad" but not from front to back.    LMP: 4/17//2022 Lab orders placed from triage: UA

## 2021-04-03 NOTE — ED Provider Notes (Signed)
Emergency Medicine Provider Triage Evaluation Note  Marilyn Casey , a 30 y.o. female  was evaluated in triage.  Pt complains of vaginal bleeding. Had a positive home pregnancy test. lmp was 5-6 weeks ago. She also has some pelvic pain and back pain.  Review of Systems  Positive: Vaginal bleeding, pelvic pain, back pain Negative: syncope  Physical Exam  BP 124/81 (BP Location: Right Arm)   Pulse 66   Temp 98.4 F (36.9 C) (Oral)   Resp 16   SpO2 100%  Gen:   Awake, no distress   Resp:  Normal effort  MSK:   Moves extremities without difficulty  Other:  Mild abd ttp  Medical Decision Making  Medically screening exam initiated at 2:12 PM.  Appropriate orders placed.  Ileanna Hellwig was informed that the remainder of the evaluation will be completed by another provider, this initial triage assessment does not replace that evaluation, and the importance of remaining in the ED until their evaluation is complete.  3:10 PM [facility] Citizen, app from [facility], accepts patient for transfer   Rayne Du 04/03/21 1510    Jacalyn Lefevre, MD 04/04/21 [phone]

## 2021-04-03 NOTE — ED Triage Notes (Signed)
Patient had home pregnancy test and yesterday developed vaginal bleeding with clots yesterday. Patient complains of lower abdominal pain with back pain. G3, P2

## 2021-04-03 NOTE — MAU Provider Note (Signed)
History     CSN: [id number]  Arrival date and time: 04/03/21 1351   Event Date/Time   First Provider Initiated Contact with Patient 04/03/21 1611      Chief Complaint  Patient presents with   Abdominal Pain   Vaginal Bleeding   HPI Marilyn Casey is a 30 y.o. [id number] at [redacted]w[redacted]d who presents from [facility] with vaginal bleeding. She reports she missed her period at the beginning of May and had 2 positive home pregnancy tests. She states today she had an episode of heavy bleeding where she soaked through her clothes. She reports intermittent back pain that has improved since her arrival to MAU.  OB History     Gravida  4   Para  2   Term  2   Preterm      AB  1   Living  2      SAB      IAB      Ectopic  1   Multiple      Live Births  2           History reviewed. No pertinent past medical history.  Past Surgical History:  Procedure Laterality Date   ECTOPIC PREGNANCY SURGERY      No family history on file.     Allergies: No Known Allergies  Medications Prior to Admission  Medication Sig Dispense Refill Last Dose   famotidine (PEPCID) 20 MG tablet Take 1 tablet (20 mg total) by mouth 2 (two) times daily. 60 tablet 0    ibuprofen (ADVIL) 200 MG tablet Take 200-1,000 mg by mouth every 6 (six) hours as needed for headache or moderate pain.      ibuprofen (ADVIL) 400 MG tablet Take 1 tablet (400 mg total) by mouth every 6 (six) hours as needed for up to 30 doses for mild pain or moderate pain. 30 tablet 0     Review of Systems  Constitutional: Negative.  Negative for fatigue and fever.  HENT: Negative.   Respiratory: Negative.  Negative for shortness of breath.   Cardiovascular: Negative.  Negative for chest pain.  Gastrointestinal: Positive for abdominal pain. Negative for constipation, diarrhea, nausea and vomiting.  Genitourinary: Positive for vaginal bleeding. Negative for dysuria and vaginal discharge.  Musculoskeletal: Positive for back pain.   Neurological: Negative.  Negative for dizziness and headaches.   Physical Exam   Blood pressure 110/71, pulse 67, temperature 98.3 F (36.8 C), temperature source Oral, resp. rate 17, last menstrual period 02/24/2021, SpO2 100 %.  Physical Exam Vitals and nursing note reviewed.  Constitutional:      General: She is not in acute distress.    Appearance: She is well-developed.  HENT:     Head: Normocephalic.  Eyes:     Pupils: Pupils are equal, round, and reactive to light.  Cardiovascular:     Rate and Rhythm: Normal rate and regular rhythm.     Heart sounds: Normal heart sounds.  Pulmonary:     Effort: Pulmonary effort is normal. No respiratory distress.     Breath sounds: Normal breath sounds.  Abdominal:     General: Bowel sounds are normal. There is no distension.     Palpations: Abdomen is soft.     Tenderness: There is no abdominal tenderness.  Skin:    General: Skin is warm and dry.  Neurological:     Mental Status: She is alert and oriented to person, place, and time.  Psychiatric:  Behavior: Behavior normal.        Thought Content: Thought content normal.        Judgment: Judgment normal.     MAU Course  Procedures Results for orders placed or performed during the hospital encounter of 04/03/21 (from the past 24 hour(s))  I-Stat beta hCG blood, ED     Status: Abnormal   Collection Time: 04/03/21  2:29 PM  Result Value Ref Range   I-stat hCG, quantitative 7.0 (H) <5 mIU/mL   Comment 3          CBC     Status: Abnormal   Collection Time: 04/03/21  4:01 PM  Result Value Ref Range   WBC 6.4 4.0 - 10.5 K/uL   RBC 4.31 3.87 - 5.11 MIL/uL   Hemoglobin 13.5 12.0 - 15.0 g/dL   HCT 56.3 89.3 - 73.4 %   MCV 93.3 80.0 - 100.0 fL   MCH 31.3 26.0 - 34.0 pg   MCHC 33.6 30.0 - 36.0 g/dL   RDW 28.7 68.1 - 15.7 %   Platelets 438 (H) 150 - 400 K/uL   nRBC 0.0 0.0 - 0.2 %  hCG, quantitative, pregnancy     Status: Abnormal   Collection Time: 04/03/21  4:01 PM   Result Value Ref Range   hCG, Beta Chain, Quant, S 7 (H) <5 mIU/mL  Rh IG workup (includes ABO/Rh)     Status: None (Preliminary result)   Collection Time: 04/03/21  4:10 PM  Result Value Ref Range   Gestational Age(Wks) 5    ABO/RH(D) O NEG    Antibody Screen      NEG Performed at [facility], [address], [place], [place] [address]    Unit Number [id number]    Blood Component Type RHIG    Unit division 00    Status of Unit ALLOCATED    Transfusion Status OK TO TRANSFUSE    MDM CBC HCG ABO/RH- O Neg Rhogam work up Rhophylac  Discussed with client the diagnosis of pregnancy of unknown anatomic location.  Three possibilities of outcome are: a healthy pregnancy that is too early to see a yolk sac to confirm the pregnancy is in the uterus, a pregnancy that is not healthy and has not developed and will not develop, and an ectopic pregnancy that is in the abdomen that cannot be identified at this time.  And ectopic pregnancy can be a life threatening situation as a pregnancy needs to be in the uterus which is a muscle and can stretch to accommodate the growth of a pregnancy.  Other structures in the pelvis and abdomen as not muscular and do not stretch with the growth of a pregnancy.  Worst case scenario is that a structure ruptures with a growing pregnancy not in the uterus and and internal hemorrhage can be a life threatening situation.  We need to follow the progression of this pregnancy carefully.  We need to check another serum pregnancy hormone level to determine if the levels are rising appropriately  and to determine the next steps that are needed for you. Patient's questions were answered.  Assessment and Plan   1. Pregnancy of unknown anatomic location   2. [redacted] weeks gestation of pregnancy    -Discharge home in stable condition -Strict ectopic precautions discussed -Patient advised to follow-up with MAU on Friday at 1600 for repeat HCG -Patient may return to MAU as  needed or if her condition were to change or worsen  Rolm Bookbinder CNM 04/03/2021, 4:12 PM

## 2021-04-03 NOTE — Discharge Instructions (Signed)
Abdominal Pain During Pregnancy Abdominal pain is common during pregnancy and has many possible causes. Some causes are more serious than others, and sometimes the cause is not known. Abdominal pain can be a sign that labor is starting. It can also be caused by normal growth of your baby causing stretching of muscles and ligaments during pregnancy. Always tell your health care provider if you have any abdominal pain. Follow these instructions at home:  Do not have sex or put anything in your vagina until your pain goes away completely.  Get plenty of rest until your pain improves.  Drink enough fluid to keep your urine pale yellow.  Take over-the-counter and prescription medicines only as told by your health care provider.  Keep all follow-up visits. This is important.   Contact a health care provider if:  Your pain continues or gets worse after resting.  You have lower abdominal pain that: ? Comes and goes at regular intervals. ? Spreads to your back. ? Is similar to menstrual cramps.  You have pain or burning when you urinate. Get help right away if:  You have a fever, chills, or shortness of breath.  You have vaginal bleeding.  You are leaking fluid or passing tissue from your vagina.  You have vomiting or diarrhea that lasts for more than 24 hours.  Your baby is moving less than usual.  You feel very weak or faint.  You develop severe pain in your upper abdomen. Summary  Abdominal pain is common during pregnancy and has many possible causes.  If you experience abdominal pain during pregnancy, tell your health care provider right away.  Follow your health care provider's home care instructions and keep all follow-up visits as told. This information is not intended to replace advice given to you by your health care provider. Make sure you discuss any questions you have with your health care provider. Document Revised: 07/10/2020 Document Reviewed: 07/10/2020 Elsevier  Patient Education  2021 Elsevier Inc.  

## 2021-04-04 LAB — RH IG WORKUP (INCLUDES ABO/RH)
ABO/RH(D): O NEG
Antibody Screen: NEGATIVE
Gestational Age(Wks): 5
Unit division: 0

## 2021-04-05 ENCOUNTER — Inpatient Hospital Stay (HOSPITAL_COMMUNITY)
Admission: AD | Admit: 2021-04-05 | Discharge: 2021-04-05 | Disposition: A | Discharge status: Home / Self Care | Payer: 59 | Attending: Family Medicine | Admitting: Family Medicine

## 2021-04-05 ENCOUNTER — Other Ambulatory Visit: Payer: Self-pay

## 2021-04-05 DIAGNOSIS — O3680X0 Pregnancy with inconclusive fetal viability, not applicable or unspecified: Secondary | ICD-10-CM

## 2021-04-05 DIAGNOSIS — Z6741 Type O blood, Rh negative: Secondary | ICD-10-CM | POA: Diagnosis not present

## 2021-04-05 DIAGNOSIS — Z3A01 Less than 8 weeks gestation of pregnancy: Secondary | ICD-10-CM | POA: Insufficient documentation

## 2021-04-05 DIAGNOSIS — O039 Complete or unspecified spontaneous abortion without complication: Secondary | ICD-10-CM | POA: Insufficient documentation

## 2021-04-05 LAB — HCG, QUANTITATIVE, PREGNANCY: hCG, Beta Chain, Quant, S: 3 m[IU]/mL (ref <5)

## 2021-04-05 NOTE — MAU Note (Signed)
Presents for HCG Level.  Denies pain, but endorses moderate VB, not saturating sanitary napkins.

## 2021-04-05 NOTE — MAU Provider Note (Signed)
Subjective:  Marilyn Casey is a 30 y.o. [id number] at [redacted]w[redacted]d who presents today for FU BHCG. She was seen on 04/03/2021. Results from that day show no IUP on [place], and HCG 7. She reports vaginal bleeding- scant amount. She denies abdominal or pelvic pain.  Objective:  Physical Exam  Nursing note and vitals reviewed. Constitutional: She is oriented to person, place, and time. She appears well-developed and well-nourished. No distress.  HENT:  Head: Normocephalic.  Cardiovascular: Normal rate.  Respiratory: Effort normal.  GI: Soft. There is no tenderness.  Neurological: She is alert and oriented to person, place, and time. Skin: Skin is warm and dry.  Psychiatric: She has a normal mood and affect.   Results for orders placed or performed during the hospital encounter of 04/05/21 (from the past 24 hour(s))  hCG, quantitative, pregnancy     Status: None   Collection Time: 04/05/21  5:37 PM  Result Value Ref Range   hCG, Beta Chain, Quant, S 3 <5 mIU/mL   O negative blood type, patient received Rhogam on 04/03/21  Assessment/Plan: Pregnancy of unknown location HCG did not rise appropriately FU as needed, if symptoms worsen Quant is now negative.    Duane Lope, NP 04/05/2021 7:41 PM

## 2021-04-05 NOTE — Discharge Instructions (Signed)
Miscarriage A miscarriage is the loss of pregnancy before the 20th week. Most miscarriages happen during the first 3 months of pregnancy. Sometimes, a miscarriage can happen before a woman knows that she is pregnant. Having a miscarriage can be an emotional experience. If you have had a miscarriage, talk with your health care provider about any questions you may have about the loss of your baby, the grieving process, and your plans for future pregnancy. What are the causes? Many times, the cause of a miscarriage is not known. What increases the risk? The following factors may make a pregnant woman more likely to have a miscarriage: Certain medical conditions  Conditions that affect the hormone balance in the body, such as thyroid disease or polycystic ovary syndrome.  Diabetes.  Autoimmune disorders.  Infections.  Bleeding disorders.  Obesity. Lifestyle factors  Using products with tobacco or nicotine in them or being exposed to tobacco smoke.  Having alcohol.  Having large amounts of caffeine.  Recreational drug use. Problems with reproductive organs or structures  Cervical insufficiency. This is when the lowest part of the uterus (cervix) opens and thins before pregnancy is at term.  Having a condition called Asherman syndrome. This syndrome causes scarring in the uterus or causes the uterus to be abnormal in structure.  Fibrous growths, called fibroids, in the uterus.  Congenital abnormalities. These problems are present at birth.  Infection of the cervix or uterus. Personal or medical history  Injury (trauma).  Having had a miscarriage before.  Being younger than age 18 or older than age 35.  Exposure to harmful substances in the environment. This may include radiation or heavy metals, such as lead.  Use of certain medicines. What are the signs or symptoms? Symptoms of this condition include:  Vaginal bleeding or spotting, with or without cramps or  pain.  Pain or cramping in the abdomen or lower back.  Fluid or tissue coming out of the vagina. How is this diagnosed? This condition may be diagnosed based on:  A physical exam.  Ultrasound.  Lab tests, such as blood tests, urine tests, or swabs for infection. How is this treated? Treatment for a miscarriage is sometimes not needed if all the pregnancy tissue that was in the uterus comes out on its own, and there are no other problems such as infection or heavy bleeding. In other cases, this condition may be treated with:  Dilation and curettage (D&C). In this procedure, the cervix is stretched open and any remaining pregnancy tissue is removed from the lining of the uterus (endometrium).  Medicines. These may include: ? Antibiotic medicine, to treat infection. ? Medicine to help any remaining pregnancy tissue come out of the body. ? Medicine to reduce (contract) the size of the uterus. These medicines may be given if there is a lot of bleeding. If you have Rh-negative blood, you may be given an injection of a medicine called Rho(D) immune globulin. This medicine helps prevent problems with future pregnancies. Follow these instructions at home: Medicines  Take over-the-counter and prescription medicines only as told by your health care provider.  If you were prescribed antibiotic medicine, take it as told by your health care provider. Do not stop taking the antibiotic even if you start to feel better. Activity  Rest as told by your health care provider. Ask your health care provider what activities are safe for you.  Have someone help with home and family responsibilities during this time. General instructions  Monitor how much tissue   or blood clot material comes out of the vagina.  Do not have sex, douche, or put anything, such as tampons, in your vagina until your health care provider says it is okay.  To help you and your partner with the grieving process, talk with your  health care provider or get counseling.  When you are ready, meet with your health care provider to discuss any important steps you should take for your health. Also, discuss steps you should take to have a healthy pregnancy in the future.  Keep all follow-up visits. This is important.   Where to find more information  The American College of Obstetricians and Gynecologists: acog.org  U.S. Department of Health and Human Services Office of Women's Health: hrsa.gov/office-womens-health Contact a health care provider if:  You have a fever or chills.  There is bad-smelling fluid coming from the vagina.  You have more bleeding instead of less.  Tissue or blood clots come out of your vagina. Get help right away if:  You have severe cramps or pain in your back or abdomen.  Heavy bleeding soaks through 2 large sanitary pads an hour for more than 2 hours.  You become light-headed or weak.  You faint.  You feel sad, and your sadness takes over your thoughts.  You think about hurting yourself. If you ever feel like you may hurt yourself or others, or have thoughts about taking your own life, get help right away. Go to your nearest emergency department or:  Call your local emergency services (911 in the [place]  Call a suicide crisis helpline, such as the National Suicide Prevention Lifeline at 1-800-273-8255. This is open 24 hours a day in the [place]  Text the Crisis Text Line at 741741 (in the [place] Summary  Most miscarriages happen in the first 3 months of pregnancy. Sometimes miscarriage happens before a woman knows that she is pregnant.  Follow instructions from your health care provider about medicines and activity.  To help you and your partner with grieving, talk with your health care provider or get counseling.  Keep all follow-up visits. This information is not intended to replace advice given to you by your health care provider. Make sure you discuss any questions you  have with your health care provider. Document Revised: 04/27/2020 Document Reviewed: 04/27/2020 Elsevier Patient Education  2021 Elsevier Inc.  

## 2021-06-03 ENCOUNTER — Other Ambulatory Visit: Payer: Self-pay

## 2021-06-03 ENCOUNTER — Encounter (HOSPITAL_COMMUNITY): Payer: Self-pay | Admitting: Obstetrics & Gynecology

## 2021-06-03 ENCOUNTER — Inpatient Hospital Stay (HOSPITAL_COMMUNITY)
Admission: AD | Admit: 2021-06-03 | Discharge: 2021-06-03 | Disposition: A | Discharge status: Home / Self Care | Payer: 59 | Attending: Obstetrics & Gynecology | Admitting: Obstetrics & Gynecology

## 2021-06-03 DIAGNOSIS — O26891 Other specified pregnancy related conditions, first trimester: Secondary | ICD-10-CM | POA: Diagnosis not present

## 2021-06-03 DIAGNOSIS — O219 Vomiting of pregnancy, unspecified: Secondary | ICD-10-CM

## 2021-06-03 DIAGNOSIS — N644 Mastodynia: Secondary | ICD-10-CM | POA: Insufficient documentation

## 2021-06-03 DIAGNOSIS — O26899 Other specified pregnancy related conditions, unspecified trimester: Secondary | ICD-10-CM

## 2021-06-03 DIAGNOSIS — Z87891 Personal history of nicotine dependence: Secondary | ICD-10-CM | POA: Insufficient documentation

## 2021-06-03 DIAGNOSIS — R110 Nausea: Secondary | ICD-10-CM | POA: Diagnosis not present

## 2021-06-03 DIAGNOSIS — R12 Heartburn: Secondary | ICD-10-CM | POA: Diagnosis not present

## 2021-06-03 DIAGNOSIS — Z79899 Other long term (current) drug therapy: Secondary | ICD-10-CM | POA: Insufficient documentation

## 2021-06-03 DIAGNOSIS — Z3A00 Weeks of gestation of pregnancy not specified: Secondary | ICD-10-CM | POA: Diagnosis not present

## 2021-06-03 HISTORY — DX: Other specified health status: Z78.9

## 2021-06-03 LAB — CBC
HCT: 34.7 % — ABNORMAL LOW (ref 36.0–46.0)
Hemoglobin: 11.9 g/dL — ABNORMAL LOW (ref 12.0–15.0)
MCH: 31.5 pg (ref 26.0–34.0)
MCHC: 34.3 g/dL (ref 30.0–36.0)
MCV: 91.8 fL (ref 80.0–100.0)
Platelets: 401 10*3/uL — ABNORMAL HIGH (ref 150–400)
RBC: 3.78 MIL/uL — ABNORMAL LOW (ref 3.87–5.11)
RDW: 12.2 % (ref 11.5–15.5)
WBC: 6.1 10*3/uL (ref 4.0–10.5)
nRBC: 0 % (ref 0.0–0.2)

## 2021-06-03 LAB — URINALYSIS, ROUTINE W REFLEX MICROSCOPIC
Bilirubin Urine: NEGATIVE
Glucose, UA: NEGATIVE mg/dL
Hgb urine dipstick: NEGATIVE
Ketones, ur: NEGATIVE mg/dL
Leukocytes,Ua: NEGATIVE
Nitrite: NEGATIVE
Protein, ur: NEGATIVE mg/dL
Specific Gravity, Urine: 1.026 (ref 1.005–1.030)
pH: 6 (ref 5.0–8.0)

## 2021-06-03 LAB — COMPREHENSIVE METABOLIC PANEL
ALT: 15 U/L (ref 0–44)
AST: 14 U/L — ABNORMAL LOW (ref 15–41)
Albumin: 3.3 g/dL — ABNORMAL LOW (ref 3.5–5.0)
Alkaline Phosphatase: 58 U/L (ref 38–126)
Anion gap: 6 (ref 5–15)
BUN: 8 mg/dL (ref 6–20)
CO2: 26 mmol/L (ref 22–32)
Calcium: 9 mg/dL (ref 8.9–10.3)
Chloride: 103 mmol/L (ref 98–111)
Creatinine, Ser: 0.65 mg/dL (ref 0.44–1.00)
GFR, Estimated: >60 mL/min (ref >60)
Glucose, Bld: 96 mg/dL (ref 70–99)
Potassium: 3.8 mmol/L (ref 3.5–5.1)
Sodium: 135 mmol/L (ref 135–145)
Total Bilirubin: 0.2 mg/dL — ABNORMAL LOW (ref 0.3–1.2)
Total Protein: 6.2 g/dL — ABNORMAL LOW (ref 6.5–8.1)

## 2021-06-03 LAB — POCT PREGNANCY, URINE: Preg Test, Ur: POSITIVE — AB

## 2021-06-03 LAB — LIPASE, BLOOD: Lipase: 36 U/L (ref 11–51)

## 2021-06-03 MED ORDER — LIDOCAINE VISCOUS HCL 2 % MT SOLN
15.0000 mL | Freq: Once | OROMUCOSAL | Status: AC
  Administered 2021-06-03: 15 mL via ORAL
  Filled 2021-06-03: qty 15

## 2021-06-03 MED ORDER — ACETAMINOPHEN 500 MG PO TABS
1000.0000 mg | ORAL_TABLET | Freq: Once | ORAL | Status: AC
  Administered 2021-06-03: 1000 mg via ORAL
  Filled 2021-06-03: qty 2

## 2021-06-03 MED ORDER — PROMETHAZINE HCL 25 MG PO TABS: 25.0000 mg | ORAL_TABLET | Freq: Four times a day (QID) | ORAL | 0 refills | Status: DC | PRN

## 2021-06-03 MED ORDER — ALUM & MAG HYDROXIDE-SIMETH 200-200-20 MG/5ML PO SUSP
30.0000 mL | Freq: Once | ORAL | Status: AC
  Administered 2021-06-03: 30 mL via ORAL
  Filled 2021-06-03: qty 30

## 2021-06-03 MED ORDER — PANTOPRAZOLE SODIUM 40 MG PO TBEC: 40.0000 mg | DELAYED_RELEASE_TABLET | Freq: Every day | ORAL | 0 refills | Status: DC

## 2021-06-03 NOTE — MAU Note (Addendum)
2 days ago started having pain under breasts, at bra line and between breasts, hurts in breasts also. Looked for a lump and didn't find one.  Feels like not sure if fetus is causing chest to expand. No vaginal bleeding. Denies heartburn or nausea. Had a miscarriage in May. Took home pregnancy test in June and it was positive.  Hasn't been seen for this pregnancy yet.

## 2021-06-03 NOTE — MAU Provider Note (Signed)
History     CSN: [id number]  Arrival date and time: 06/03/21 0041   Event Date/Time   First Provider Initiated Contact with Patient 06/03/21 0208      Chief Complaint  Patient presents with   Breast Pain    And under breast pain   HPI [facility] Marilyn Casey is a 30 y.o. [id number] at Unknown who presents with epigastric and sternal pain.  Symptoms started 2 days ago.  Reports an aching pain in her mid epigastric area that radiates to the substernal chest.  Pain is intermittent and occurs multiple times per day.  Has not treated symptoms.  No aggravating or alleviating factors.  Does endorse some nausea since finding out she was pregnant.  Has a history of heartburn but is not currently on medication for it.  Currently rates pain 3 out of 10.  Denies lower abdominal pain, vaginal bleeding, cough, chest pain, or shortness of breath.  OB History     Gravida  5   Para  2   Term  2   Preterm      AB  2   Living  2      SAB  1   IAB      Ectopic  1   Multiple      Live Births  2           Past Medical History:  Diagnosis Date   Medical history non-contributory     Past Surgical History:  Procedure Laterality Date   ECTOPIC PREGNANCY SURGERY      History reviewed. No pertinent family history.  Social History   Tobacco Use   Smoking status: Former    Types: Cigarettes   Smokeless tobacco: Never  Vaping Use   Vaping Use: Never used    Allergies: No Known Allergies  Medications Prior to Admission  Medication Sig Dispense Refill Last Dose   Prenatal Vit-Fe Fumarate-FA (PRENATAL MULTIVITAMIN) TABS tablet Take 1 tablet by mouth daily at 12 noon.   06/02/2021   famotidine (PEPCID) 20 MG tablet Take 1 tablet (20 mg total) by mouth 2 (two) times daily. 60 tablet 0     Review of Systems  Constitutional: Negative.   Respiratory: Negative.    Cardiovascular: Negative.   Gastrointestinal:  Positive for abdominal pain and nausea. Negative for constipation, diarrhea  and vomiting.  Genitourinary: Negative.   Physical Exam   Blood pressure 120/70, pulse 77, temperature 98.3 F (36.8 C), resp. rate 16, height 5\' 4"  (1.626 m), weight 71.2 kg, SpO2 100 %, not currently breastfeeding.  Physical Exam Vitals and nursing note reviewed.  Constitutional:      General: She is not in acute distress.    Appearance: Normal appearance.  HENT:     Head: Normocephalic and atraumatic.  Eyes:     General: No scleral icterus. Pulmonary:     Effort: Pulmonary effort is normal. No respiratory distress.  Abdominal:     General: Abdomen is flat.     Palpations: Abdomen is soft.     Tenderness: There is abdominal tenderness in the epigastric area. There is no guarding or rebound. Negative signs include Murphy's sign.  Skin:    General: Skin is warm and dry.  Neurological:     Mental Status: She is alert.  Psychiatric:        Mood and Affect: Mood normal.        Behavior: Behavior normal.    MAU Course  Procedures Results for orders placed or  performed during the hospital encounter of 06/03/21 (from the past 24 hour(s))  Pregnancy, urine POC     Status: Abnormal   Collection Time: 06/03/21  1:17 AM  Result Value Ref Range   Preg Test, Ur POSITIVE (A) NEGATIVE  Urinalysis, Routine w reflex microscopic Urine, Clean Catch     Status: Abnormal   Collection Time: 06/03/21  1:19 AM  Result Value Ref Range   Color, Urine YELLOW YELLOW   APPearance HAZY (A) CLEAR   Specific Gravity, Urine 1.026 1.005 - 1.030   pH 6.0 5.0 - 8.0   Glucose, UA NEGATIVE NEGATIVE mg/dL   Hgb urine dipstick NEGATIVE NEGATIVE   Bilirubin Urine NEGATIVE NEGATIVE   Ketones, ur NEGATIVE NEGATIVE mg/dL   Protein, ur NEGATIVE NEGATIVE mg/dL   Nitrite NEGATIVE NEGATIVE   Leukocytes,Ua NEGATIVE NEGATIVE  CBC     Status: Abnormal   Collection Time: 06/03/21  2:53 AM  Result Value Ref Range   WBC 6.1 4.0 - 10.5 K/uL   RBC 3.78 (L) 3.87 - 5.11 MIL/uL   Hemoglobin 11.9 (L) 12.0 - 15.0 g/dL    HCT 63.1 (L) 49.7 - 46.0 %   MCV 91.8 80.0 - 100.0 fL   MCH 31.5 26.0 - 34.0 pg   MCHC 34.3 30.0 - 36.0 g/dL   RDW 02.6 37.8 - 58.8 %   Platelets 401 (H) 150 - 400 K/uL   nRBC 0.0 0.0 - 0.2 %  Comprehensive metabolic panel     Status: Abnormal   Collection Time: 06/03/21  2:53 AM  Result Value Ref Range   Sodium 135 135 - 145 mmol/L   Potassium 3.8 3.5 - 5.1 mmol/L   Chloride 103 98 - 111 mmol/L   CO2 26 22 - 32 mmol/L   Glucose, Bld 96 70 - 99 mg/dL   BUN 8 6 - 20 mg/dL   Creatinine, Ser 5.02 0.44 - 1.00 mg/dL   Calcium 9.0 8.9 - 77.4 mg/dL   Total Protein 6.2 (L) 6.5 - 8.1 g/dL   Albumin 3.3 (L) 3.5 - 5.0 g/dL   AST 14 (L) 15 - 41 U/L   ALT 15 0 - 44 U/L   Alkaline Phosphatase 58 38 - 126 U/L   Total Bilirubin 0.2 (L) 0.3 - 1.2 mg/dL   GFR, Estimated >12 >87 mL/min   Anion gap 6 5 - 15  Lipase, blood     Status: None   Collection Time: 06/03/21  2:53 AM  Result Value Ref Range   Lipase 36 11 - 51 U/L    MDM Early pregnant patient at unknown gestational age presents with epigastric pain and nausea.  No OB complaints.  She has a normal EKG and normal labs.  Symptoms treated with GI cocktail and Tylenol in MAU.  Symptoms resolved after the GI cocktail but started to return after the medication wore off and currently rates pain 2 out of 10.  Will prescribe antiemetic and start on daily Protonix.  Also given list of OB/GYN providers to start care.  Assessment and Plan   1. Heartburn during pregnancy in first trimester   2. Pregnancy related nausea, antepartum    -Rx phenergan -rx protonix -start prenatal care  Judeth Horn 06/03/2021, 2:08 AM

## 2021-06-03 NOTE — Discharge Instructions (Signed)
  [facility] Providers          [facility]  [address], [place], [place] [address]  [phone]  [facility]  [address], [place], [place] [address]  [phone]  [facility]  [address], [place], [place] [address]  [phone]  [facility] [place] [facility]  [address], [place], [place] [address]  [phone]  [facility]  [address]), [place], [place] [address]  [phone]  [facility] [address], [place], [place] [address] [phone]  [facility]  [address], [place], [place] [address]  [phone]  [facility]  [address], [place], [place] [address]  [phone]  [facility]  [address], [place], [place] [address]  [phone]  [facility]  [address], [place], [place] [address]  [phone]  [facility]  [address], [place], [place] [address]  [phone]  [facility]  [address], [place], [place] [address]  [phone]  [facility]   [address], [place], [place] [address]  [phone]  [facility]  [address], [place], [place] [address]   [phone]  [facility]  [address], [place], [place] [address]  [phone]         

## 2021-07-10 ENCOUNTER — Telehealth (INDEPENDENT_AMBULATORY_CARE_PROVIDER_SITE_OTHER): Payer: 59

## 2021-07-10 DIAGNOSIS — Z98891 History of uterine scar from previous surgery: Secondary | ICD-10-CM | POA: Insufficient documentation

## 2021-07-10 DIAGNOSIS — Z8759 Personal history of other complications of pregnancy, childbirth and the puerperium: Secondary | ICD-10-CM | POA: Insufficient documentation

## 2021-07-10 DIAGNOSIS — O0990 Supervision of high risk pregnancy, unspecified, unspecified trimester: Secondary | ICD-10-CM | POA: Insufficient documentation

## 2021-07-10 DIAGNOSIS — O0991 Supervision of high risk pregnancy, unspecified, first trimester: Secondary | ICD-10-CM

## 2021-07-10 DIAGNOSIS — Z3A00 Weeks of gestation of pregnancy not specified: Secondary | ICD-10-CM

## 2021-07-10 DIAGNOSIS — O09299 Supervision of pregnancy with other poor reproductive or obstetric history, unspecified trimester: Secondary | ICD-10-CM

## 2021-07-10 DIAGNOSIS — O34219 Maternal care for unspecified type scar from previous cesarean delivery: Secondary | ICD-10-CM

## 2021-07-10 DIAGNOSIS — Z3687 Encounter for antenatal screening for uncertain dates: Secondary | ICD-10-CM

## 2021-07-10 MED ORDER — BLOOD PRESSURE KIT DEVI: 1.0000 | 0 refills | Status: DC | PRN

## 2021-07-10 MED ORDER — GOJJI WEIGHT SCALE MISC: 1.0000 | 0 refills | Status: DC | PRN

## 2021-07-10 NOTE — Patient Instructions (Addendum)
Clink on link below to obtain information for the Pregnancy Navigators: [email or web address]

## 2021-07-10 NOTE — Progress Notes (Signed)
Called pt and left message that I am sorry for the delay that I am calling about her virtual NEW OB intake appt.  If she could please call the office and that I will call her back in 15-20 minutes.    Buford Bremer,RN  07/10/21  New OB Intake  I connected with  Marilyn Casey on 07/10/21 at  9:15 AM EDT by telephone Video Visit and verified that I am speaking with the correct person using two identifiers. Nurse is located at [facility] and pt is located at home.  I discussed the limitations, risks, security and privacy concerns of performing an evaluation and management service by telephone and the availability of in person appointments. I also discussed with the patient that there may be a patient responsible charge related to this service. The patient expressed understanding and agreed to proceed.  I explained I am completing New OB Intake today. We discussed her EDD will be determined by dating ultrasound scheduled on 07/18/21 due to patient unsure of LMP.  Pt is G5/P2. I reviewed her allergies, medications, Medical/Surgical/OB history, and appropriate screenings. I informed her of [facility] services. Based on history, this is a/an  pregnancy complicated by c-section x 2 and history of pre-eclampsia  .   Patient Active Problem List   Diagnosis Date Noted   Supervision of high risk pregnancy, antepartum 07/10/2021    Concerns addressed today  Delivery Plans:  Plans to deliver at [facility].   MyChart/Babyscripts MyChart access verified. I explained pt will have some visits in office and some virtually. Babyscripts instructions given and order placed. Patient verifies receipt of registration text/e-mail. Account successfully created and app downloaded.  Blood Pressure Cuff  Blood pressure cuff ordered for patient to pick-up from [employer]. Explained after first prenatal appt pt will check weekly and document in Babyscripts.  Weight scale: Weight scale ordered for patient to pick up from [employer].   Anatomy US Patient unsure of LMP, she believes sometime in June.  Dating U/S scheduled for September 8th @ 0745.  Patient advised that once her dating is confirmed we will schedule her anatomy U/S.  Patient verbalized understanding.   Labs Discussed Avelina Laine genetic screening with patient. Would like both Panorama and Horizon drawn at new OB visit. Routine prenatal labs needed.  Covid Vaccine Patient has not covid vaccine.   Mother/ Baby Dyad Candidate?  Patient is not a candidate.   If yes, offer as possibility  Informed patient of [facility] website  and placed link in her AVS.   Patient plans to gain access to her MyChart by calling [phone] that was provided to trouble shoot.  Link in patient instructions.    Social Determinants of Health Food Insecurity: Patient denies food insecurity. WIC Referral: Patient is interested in referral to [facility].  Transportation: Patient denies transportation needs. Childcare: Discussed no children allowed at ultrasound appointments. Offered childcare services; patient declines childcare services at this time.  Send link to Pregnancy Navigators   Placed OB Box on problem list and updated  First visit review I reviewed new OB appt with pt. I explained she will have a pelvic exam, ob bloodwork with genetic screening, and PAP smear. Explained pt will be seen by Edd Arbour, CNM at first visit; encounter routed to appropriate provider. Explained that patient will be seen by pregnancy navigator following visit with provider. [facility] information placed in AVS.   Ralene Bathe, RN 07/10/2021  10:57 AM

## 2021-07-16 NOTE — Progress Notes (Signed)
Patient was assessed and managed by nursing staff during this encounter. I have reviewed the chart and agree with the documentation and plan.   Abie Killian, MSN, CNM, IBCLC 07/16/21 3:43 PM  

## 2021-07-18 ENCOUNTER — Ambulatory Visit (HOSPITAL_BASED_OUTPATIENT_CLINIC_OR_DEPARTMENT_OTHER): Payer: Medicaid Other

## 2021-07-18 ENCOUNTER — Ambulatory Visit
Admission: RE | Admit: 2021-07-18 | Discharge: 2021-07-18 | Disposition: A | Discharge status: Home / Self Care | Payer: Medicaid Other | Source: Ambulatory Visit | Attending: Certified Nurse Midwife | Admitting: Certified Nurse Midwife

## 2021-07-18 ENCOUNTER — Other Ambulatory Visit: Payer: Self-pay

## 2021-07-18 ENCOUNTER — Ambulatory Visit: Payer: 59

## 2021-07-18 DIAGNOSIS — Z3687 Encounter for antenatal screening for uncertain dates: Secondary | ICD-10-CM | POA: Insufficient documentation

## 2021-07-18 DIAGNOSIS — O0991 Supervision of high risk pregnancy, unspecified, first trimester: Secondary | ICD-10-CM | POA: Diagnosis present

## 2021-07-24 ENCOUNTER — Other Ambulatory Visit (HOSPITAL_COMMUNITY)
Admission: RE | Admit: 2021-07-24 | Discharge: 2021-07-24 | Disposition: A | Discharge status: Home / Self Care | Payer: 59 | Source: Ambulatory Visit | Attending: Certified Nurse Midwife | Admitting: Certified Nurse Midwife

## 2021-07-24 ENCOUNTER — Ambulatory Visit (INDEPENDENT_AMBULATORY_CARE_PROVIDER_SITE_OTHER): Payer: Medicaid Other | Admitting: Certified Nurse Midwife

## 2021-07-24 ENCOUNTER — Other Ambulatory Visit: Payer: Self-pay

## 2021-07-24 VITALS — BP 113/73 | HR 81 | Wt 165.8 lb

## 2021-07-24 DIAGNOSIS — O0991 Supervision of high risk pregnancy, unspecified, first trimester: Secondary | ICD-10-CM | POA: Diagnosis not present

## 2021-07-24 DIAGNOSIS — O0932 Supervision of pregnancy with insufficient antenatal care, second trimester: Secondary | ICD-10-CM | POA: Diagnosis not present

## 2021-07-24 DIAGNOSIS — Z3A14 14 weeks gestation of pregnancy: Secondary | ICD-10-CM

## 2021-07-24 DIAGNOSIS — Z98891 History of uterine scar from previous surgery: Secondary | ICD-10-CM

## 2021-07-24 NOTE — Progress Notes (Signed)
Patient stated that she has occasional pain on her "c-section" insincion

## 2021-07-26 LAB — AFP, SERUM, OPEN SPINA BIFIDA
AFP MoM: 1.32
AFP Value: 50.4 ng/mL
Gest. Age on Collection Date: 16.4 weeks
Maternal Age At EDD: 32.9 yr
OSBR Risk 1 IN: 9058
Test Results:: NEGATIVE
Weight: 165 [lb_av]

## 2021-07-26 LAB — CBC/D/PLT+RPR+RH+ABO+RUBIGG...
Antibody Screen: NEGATIVE
Basophils Absolute: 0 10*3/uL (ref 0.0–0.2)
Basos: 0 %
EOS (ABSOLUTE): 0.1 10*3/uL (ref 0.0–0.4)
Eos: 2 %
HCV Ab: <0.1 s/co ratio (ref 0.0–0.9)
HIV Screen 4th Generation wRfx: NONREACTIVE
Hematocrit: 37.9 % (ref 34.0–46.6)
Hemoglobin: 12.8 g/dL (ref 11.1–15.9)
Hepatitis B Surface Ag: NEGATIVE
Immature Grans (Abs): 0 10*3/uL (ref 0.0–0.1)
Immature Granulocytes: 0 %
Lymphocytes Absolute: 1.9 10*3/uL (ref 0.7–3.1)
Lymphs: 29 %
MCH: 30.9 pg (ref 26.6–33.0)
MCHC: 33.8 g/dL (ref 31.5–35.7)
MCV: 92 fL (ref 79–97)
Monocytes Absolute: 0.5 10*3/uL (ref 0.1–0.9)
Monocytes: 7 %
Neutrophils Absolute: 4.1 10*3/uL (ref 1.4–7.0)
Neutrophils: 62 %
Platelets: 385 10*3/uL (ref 150–450)
RBC: 4.14 x10E6/uL (ref 3.77–5.28)
RDW: 13 % (ref 11.7–15.4)
RPR Ser Ql: NONREACTIVE
Rh Factor: NEGATIVE
Rubella Antibodies, IGG: 2.77 index (ref >0.99)
WBC: 6.6 10*3/uL (ref 3.4–10.8)

## 2021-07-26 LAB — HEMOGLOBIN A1C
Est. average glucose Bld gHb Est-mCnc: 111 mg/dL
Hgb A1c MFr Bld: 5.5 % (ref 4.8–5.6)

## 2021-07-26 LAB — URINE CULTURE, OB REFLEX

## 2021-07-26 LAB — HCV INTERPRETATION

## 2021-07-26 LAB — CULTURE, OB URINE

## 2021-07-26 NOTE — Progress Notes (Signed)
History:   Marilyn Casey is a 30 y.o. L3T3428 at 50w6dby LMP being seen today for her first obstetrical visit.  Her obstetrical history is significant for pre-eclampsia and two Cesarean sections. The first one was for severe preeclampsia, she did not know why IOL was not attempted. Second delivery she began to labor and asked for a section because the pain was too intense. Desires to TOLAC this time . Patient does intend to breast feed. Pregnancy history fully reviewed.  Patient reports  pain behind her CS scar but no other physical complaints .   HISTORY: OB History  Gravida Para Term Preterm AB Living  5 2 0 '2 2 2  ' SAB IAB Ectopic Multiple Live Births  1 0 1 0 2    # Outcome Date GA Lbr Len/2nd Weight Sex Delivery Anes PTL Lv  5 Current           4 SAB 03/2021          3 Preterm 04/13/18 316w0d6 lb 8 oz (2.948 kg) F CS-Unspec Spinal  LIV  2 Preterm 03/15/17 3571w4d lb (2.722 kg) M CS-Unspec Spinal  LIV     Complications: Preeclampsia  1 Ectopic 2014            Last pap smear date unknown, collected today  Past Medical History:  Diagnosis Date   Medical history non-contributory    Preeclampsia    Past Surgical History:  Procedure Laterality Date   CESAREAN SECTION     ECTOPIC PREGNANCY SURGERY     Family History  Problem Relation Age of Onset   Diabetes Mother    Social History   Tobacco Use   Smoking status: Former    Types: Cigarettes    Quit date: 06/10/2021    Years since quitting: 0.1   Smokeless tobacco: Never  Vaping Use   Vaping Use: Never used  Substance Use Topics   Alcohol use: Not Currently   Drug use: Never   No Known Allergies Current Outpatient Medications on File Prior to Visit  Medication Sig Dispense Refill   Prenatal Vit-Fe Fumarate-FA (PRENATAL MULTIVITAMIN) TABS tablet Take 1 tablet by mouth daily at 12 noon.     Blood Pressure Monitoring (BLOOD PRESSURE KIT) DEVI 1 Device by Does not apply route as needed. (Patient not taking: Reported  on 07/24/2021) 1 each 0   Misc. Devices (GOJJI WEIGHT SCALE) MISC 1 Device by Does not apply route as needed. (Patient not taking: Reported on 07/24/2021) 1 each 0   pantoprazole (PROTONIX) 40 MG tablet Take 1 tablet (40 mg total) by mouth daily. (Patient not taking: Reported on 07/24/2021) 30 tablet 0   promethazine (PHENERGAN) 25 MG tablet Take 1 tablet (25 mg total) by mouth every 6 (six) hours as needed for nausea or vomiting. (Patient not taking: No sig reported) 30 tablet 0   No current facility-administered medications on file prior to visit.   Review of Systems Pertinent items noted in HPI and remainder of comprehensive ROS otherwise negative. Physical Exam:   Vitals:   07/24/21 1515  BP: 113/73  Pulse: 81  Weight: 165 lb 12.8 oz (75.2 kg)   Fetal Heart Rate (bpm): 154 Constitutional: Well-developed, well-nourished pregnant female in no acute distress.  HEENT: PERRLA Skin: normal color and turgor, no rash Cardiovascular: normal rate & rhythm, no murmur Respiratory: normal effort, lung sounds clear throughout GI: Abd soft, non-tender, pos BS x 4, gravid appropriate for gestational age MS:  Extremities nontender, no edema, normal ROM Neurologic: Alert and oriented x 4.  GU: no CVA tenderness Pelvic: NEFG, pink ruggae, physiologic discharge, no blood, cervix clean. Pap/swabs collected  Assessment & Plan:  1. Supervision of high risk pregnancy in first trimester - Doing well overall, not feeling fetal movement yet - Genetic Screening - Culture, OB Urine - Hemoglobin A1c - CBC/D/Plt+RPR+Rh+ABO+RubIgG... - AFP, Serum, Open Spina Bifida - Cytology - PAP( [place])  2. [redacted] weeks gestation of pregnancy - Routine OB care  3. History of 2 cesarean sections - Desires TOLAC, advised she will need to request records from [place] [facility] provider so one of our MDs can review her op notes and discuss TOLAC risks/benefits. Pt understanding.  4. Initial OB visit in the second  trimester Initial labs drawn. Continue prenatal vitamins. Problem list reviewed and updated. Genetic Screening discussed, First trimester screen, Quad screen, and NIPS: ordered. Ultrasound discussed; fetal anatomic survey: ordered. Anticipatory guidance about prenatal visits given including labs, ultrasounds, and testing. Discussed usage of Babyscripts and virtual visits as additional source of managing and completing prenatal visits in midst of coronavirus and pandemic.   Encouraged to complete MyChart Registration for her ability to review results, send requests, and have questions addressed.  The nature of [facility] with multiple MDs and Advanced Practice Providers was explained to patient; also emphasized that residents, students are part of our team. Routine obstetric precautions reviewed. Encouraged to seek out care at office or emergency room [facility] preferred) for urgent and/or emergent concerns. Return in about 4 weeks (around 08/21/2021) for IN-PERSON, LOB.     Gaylan Gerold, MSN, CNM, [place] Certified Nurse Midwife, [facility]

## 2021-07-29 LAB — CYTOLOGY - PAP
Chlamydia: NEGATIVE
Comment: NEGATIVE
Comment: NEGATIVE
Comment: NORMAL
Diagnosis: NEGATIVE
High risk HPV: NEGATIVE
Neisseria Gonorrhea: NEGATIVE

## 2021-08-01 ENCOUNTER — Encounter: Payer: Self-pay | Admitting: General Practice

## 2021-08-14 ENCOUNTER — Telehealth: Payer: Self-pay | Admitting: Lactation Services

## 2021-08-14 NOTE — Telephone Encounter (Signed)
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   Patient advised to call [facility] and set a Genetic Counseling Session to discuss results.   Informed patient it is recommended partner be tested to see is he carries the same gene. Reviewed we have the saliva kits in the office that patient can pick up at next appointment to complete and mail.   Patient with no questions or concerns at this time.

## 2021-08-21 ENCOUNTER — Encounter: Payer: Self-pay | Admitting: Nurse Practitioner

## 2021-08-21 ENCOUNTER — Telehealth: Payer: Self-pay | Admitting: Family Medicine

## 2021-08-21 ENCOUNTER — Other Ambulatory Visit: Payer: Self-pay

## 2021-08-21 ENCOUNTER — Ambulatory Visit (INDEPENDENT_AMBULATORY_CARE_PROVIDER_SITE_OTHER): Payer: 59 | Admitting: Nurse Practitioner

## 2021-08-21 VITALS — BP 129/80 | HR 102 | Wt 175.3 lb

## 2021-08-21 DIAGNOSIS — O0990 Supervision of high risk pregnancy, unspecified, unspecified trimester: Secondary | ICD-10-CM

## 2021-08-21 DIAGNOSIS — Z3A20 20 weeks gestation of pregnancy: Secondary | ICD-10-CM

## 2021-08-21 DIAGNOSIS — Z98891 History of uterine scar from previous surgery: Secondary | ICD-10-CM

## 2021-08-21 NOTE — Progress Notes (Signed)
    Subjective:  Marilyn Casey is a 30 y.o. [id number] at [redacted]w[redacted]d being seen today for ongoing prenatal care.  She is currently monitored for the following issues for this low-risk pregnancy and has Supervision of high risk pregnancy, antepartum; History of C-section; and History of pre-eclampsia on their problem list.  Patient reports no complaints.  Contractions: Not present. Vag. Bleeding: None.  Movement: Present. Denies leaking of fluid.   The following portions of the patient's history were reviewed and updated as appropriate: allergies, current medications, past family history, past medical history, past social history, past surgical history and problem list. Problem list updated.  Objective:   Vitals:   08/21/21 1616  BP: 129/80  Pulse: (!) 102  Weight: 175 lb 4.8 oz (79.5 kg)    Fetal Status: Fetal Heart Rate (bpm): 146   Movement: Present     General:  Alert, oriented and cooperative. Patient is in no acute distress.  Skin: Skin is warm and dry. No rash noted.   Cardiovascular: Normal heart rate noted  Respiratory: Normal respiratory effort, no problems with respiration noted  Abdomen: Soft, gravid, appropriate for gestational age. Pain/Pressure: Absent     Pelvic:  Cervical exam deferred        Extremities: Normal range of motion.  Edema: None  Mental Status: Normal mood and affect. Normal behavior. Normal judgment and thought content.   Urinalysis:      Assessment and Plan:  Pregnancy: [id number] at [redacted]w[redacted]d  1. Supervision of high risk pregnancy, antepartum Will have sign ROI for Op notes from hospital in [place] where her other children were born Will need to see MD later in pregnancy for delivery plan Doing well and feeling baby move  - [place] MFM OB DETAIL +14 WK; Future  2. History of C-section  - [place] MFM OB DETAIL +14 WK; Future  3. [redacted] weeks gestation of pregnancy   Preterm labor symptoms and general obstetric precautions including but not limited to vaginal bleeding,  contractions, leaking of fluid and fetal movement were reviewed in detail with the patient. Please refer to After Visit Summary for other counseling recommendations.  Return in about 4 weeks (around 09/18/2021) for ROB .  Nolene Bernheim, RN, MSN, NP-BC Nurse Practitioner, [facility] for [employer], [facility] 08/21/2021 4:47 PM

## 2021-08-21 NOTE — Telephone Encounter (Signed)
Patient stated she would call back in to office to schedule her 4 week follow up OB appointment due to that being most convenient for her.

## 2021-08-21 NOTE — Progress Notes (Signed)
[place] scheduled for 09/06/21 at 0815. Patient notified.    Alesia Richards, RN 08/21/21

## 2021-08-22 ENCOUNTER — Encounter: Payer: 59 | Admitting: Certified Nurse Midwife

## 2021-08-27 ENCOUNTER — Encounter: Payer: Self-pay | Admitting: General Practice

## 2021-09-06 ENCOUNTER — Ambulatory Visit: Payer: 59 | Attending: Nurse Practitioner

## 2021-09-06 ENCOUNTER — Encounter: Payer: Self-pay | Admitting: *Deleted

## 2021-09-06 ENCOUNTER — Ambulatory Visit: Payer: 59 | Admitting: *Deleted

## 2021-09-06 ENCOUNTER — Other Ambulatory Visit: Payer: Self-pay

## 2021-09-06 VITALS — BP 115/71 | HR 81

## 2021-09-06 DIAGNOSIS — Z98891 History of uterine scar from previous surgery: Secondary | ICD-10-CM | POA: Insufficient documentation

## 2021-09-06 DIAGNOSIS — Z8759 Personal history of other complications of pregnancy, childbirth and the puerperium: Secondary | ICD-10-CM | POA: Insufficient documentation

## 2021-09-06 DIAGNOSIS — O0990 Supervision of high risk pregnancy, unspecified, unspecified trimester: Secondary | ICD-10-CM | POA: Insufficient documentation

## 2021-10-17 ENCOUNTER — Ambulatory Visit (INDEPENDENT_AMBULATORY_CARE_PROVIDER_SITE_OTHER): Payer: 59 | Admitting: Certified Nurse Midwife

## 2021-10-17 ENCOUNTER — Other Ambulatory Visit: Payer: Self-pay

## 2021-10-17 VITALS — BP 112/71 | HR 92 | Wt 186.0 lb

## 2021-10-17 DIAGNOSIS — K219 Gastro-esophageal reflux disease without esophagitis: Secondary | ICD-10-CM

## 2021-10-17 DIAGNOSIS — D509 Iron deficiency anemia, unspecified: Secondary | ICD-10-CM

## 2021-10-17 DIAGNOSIS — Z3403 Encounter for supervision of normal first pregnancy, third trimester: Secondary | ICD-10-CM | POA: Diagnosis not present

## 2021-10-17 DIAGNOSIS — O99019 Anemia complicating pregnancy, unspecified trimester: Secondary | ICD-10-CM

## 2021-10-17 DIAGNOSIS — Z3A26 26 weeks gestation of pregnancy: Secondary | ICD-10-CM

## 2021-10-17 DIAGNOSIS — O24410 Gestational diabetes mellitus in pregnancy, diet controlled: Secondary | ICD-10-CM

## 2021-10-17 DIAGNOSIS — O0990 Supervision of high risk pregnancy, unspecified, unspecified trimester: Secondary | ICD-10-CM

## 2021-10-17 DIAGNOSIS — O99613 Diseases of the digestive system complicating pregnancy, third trimester: Secondary | ICD-10-CM

## 2021-10-17 MED ORDER — FAMOTIDINE 20 MG PO TABS: 20.0000 mg | ORAL_TABLET | Freq: Two times a day (BID) | ORAL | 2 refills | Status: DC

## 2021-10-17 MED ORDER — RHO D IMMUNE GLOBULIN 1500 UNIT/2ML IJ SOSY
300.0000 ug | PREFILLED_SYRINGE | Freq: Once | INTRAMUSCULAR | Status: AC
  Administered 2021-10-17: 300 ug via INTRAMUSCULAR

## 2021-10-18 ENCOUNTER — Other Ambulatory Visit: Payer: 59

## 2021-10-18 DIAGNOSIS — Z3403 Encounter for supervision of normal first pregnancy, third trimester: Secondary | ICD-10-CM

## 2021-10-19 LAB — CBC
Hematocrit: 31.1 % — ABNORMAL LOW (ref 34.0–46.6)
Hemoglobin: 10.6 g/dL — ABNORMAL LOW (ref 11.1–15.9)
MCH: 29.8 pg (ref 26.6–33.0)
MCHC: 34.1 g/dL (ref 31.5–35.7)
MCV: 87 fL (ref 79–97)
Platelets: 488 10*3/uL — ABNORMAL HIGH (ref 150–450)
RBC: 3.56 x10E6/uL — ABNORMAL LOW (ref 3.77–5.28)
RDW: 11.9 % (ref 11.7–15.4)
WBC: 7.6 10*3/uL (ref 3.4–10.8)

## 2021-10-19 LAB — GLUCOSE TOLERANCE, 2 HOURS W/ 1HR
Glucose, 1 hour: 174 mg/dL (ref 70–179)
Glucose, 2 hour: 148 mg/dL (ref 70–152)
Glucose, Fasting: 106 mg/dL — ABNORMAL HIGH (ref 70–91)

## 2021-10-19 LAB — RPR: RPR Ser Ql: NONREACTIVE

## 2021-10-19 LAB — HIV ANTIBODY (ROUTINE TESTING W REFLEX): HIV Screen 4th Generation wRfx: NONREACTIVE

## 2021-10-20 ENCOUNTER — Encounter: Payer: Self-pay | Admitting: Certified Nurse Midwife

## 2021-10-20 MED ORDER — ACCU-CHEK NANO SMARTVIEW W/DEVICE KIT: 1.0000 | PACK | 0 refills | Status: DC

## 2021-10-20 MED ORDER — ACCU-CHEK SMARTVIEW VI STRP: ORAL_STRIP | 12 refills | Status: DC

## 2021-10-20 MED ORDER — ACCU-CHEK SOFTCLIX LANCETS MISC: 1.0000 | Freq: Four times a day (QID) | 12 refills | Status: DC

## 2021-10-20 MED ORDER — POLYSACCHARIDE IRON COMPLEX 150 MG PO CAPS: 150.0000 mg | ORAL_CAPSULE | Freq: Every day | ORAL | 3 refills | Status: DC

## 2021-10-20 NOTE — Addendum Note (Signed)
Addended by: Edd Arbour on: 10/20/2021 09:12 PM   Modules accepted: Orders

## 2021-10-20 NOTE — Progress Notes (Signed)
   PRENATAL VISIT NOTE  Subjective:  Marilyn Casey is a 30 y.o. [id number] at [redacted]w[redacted]d being seen today for ongoing prenatal care.  She is currently monitored for the following issues for this high-risk pregnancy and has Supervision of high risk pregnancy, antepartum; History of C-section; and History of pre-eclampsia on their problem list.  Patient reports heartburn.  Contractions: Not present. Vag. Bleeding: None.  Movement: Present. Denies leaking of fluid.   The following portions of the patient's history were reviewed and updated as appropriate: allergies, current medications, past family history, past medical history, past social history, past surgical history and problem list.   Objective:   Vitals:   10/17/21 0837  BP: 112/71  Pulse: 92  Weight: 186 lb (84.4 kg)    Fetal Status:     Movement: Present     General:  Alert, oriented and cooperative. Patient is in no acute distress.  Skin: Skin is warm and dry. No rash noted.   Cardiovascular: Normal heart rate noted  Respiratory: Normal respiratory effort, no problems with respiration noted  Abdomen: Soft, gravid, appropriate for gestational age.  Pain/Pressure: Absent     Pelvic: Cervical exam deferred        Extremities: Normal range of motion.  Edema: Trace  Mental Status: Normal mood and affect. Normal behavior. Normal judgment and thought content.   Assessment and Plan:  Pregnancy: [id number] at [redacted]w[redacted]d 1. Encounter for supervision of low-risk first pregnancy in third trimester - Doing well, feeling regular and vigorous fetal movement   2. [redacted] weeks gestation of pregnancy - Routine OB care - did not come fasting, will have GTT scheduled for later this week. - CBC; Future - Glucose Tolerance, 2 Hours w/1 Hour; Future - RPR; Future - HIV Antibody (routine testing w rflx); Future  3. Gastroesophageal reflux during pregnancy in third trimester, antepartum - famotidine (PEPCID) 20 MG tablet; Take 1 tablet (20 mg total) by mouth 2  (two) times daily.  Dispense: 60 tablet; Refill: 2  Preterm labor symptoms and general obstetric precautions including but not limited to vaginal bleeding, contractions, leaking of fluid and fetal movement were reviewed in detail with the patient. Please refer to After Visit Summary for other counseling recommendations.   Return in about 2 weeks (around 10/31/2021) for IN-PERSON, LOB.  Future Appointments  Date Time Provider Department Center  10/30/2021  3:55 PM Burleson, Brand Males, NP [facility]    Bernerd Limbo, CNM

## 2021-10-21 ENCOUNTER — Telehealth: Payer: Self-pay

## 2021-10-21 NOTE — Telephone Encounter (Addendum)
-----   Message from Bernerd Limbo, CNM sent at 10/20/2021  9:12 PM EST ----- Marilyn Casey needs to get in with the diabetes educator. I have ordered the glucometer and supplies as well as sent her a message.  Pt informed of her results and that her testing supplies has been sent to [facility] pharmacy on [address].  Pt informed that she has a Diabetes education appt scheduled on 10/31/21 @ 0815 and to please bring her testing supplies to the appt.  Pt verbalized understanding.   Addison Naegeli, RN  10/21/21

## 2021-10-22 ENCOUNTER — Encounter: Payer: Self-pay | Admitting: Certified Nurse Midwife

## 2021-10-28 ENCOUNTER — Other Ambulatory Visit: Payer: Self-pay | Admitting: *Deleted

## 2021-10-28 DIAGNOSIS — O24419 Gestational diabetes mellitus in pregnancy, unspecified control: Secondary | ICD-10-CM

## 2021-10-30 ENCOUNTER — Ambulatory Visit (INDEPENDENT_AMBULATORY_CARE_PROVIDER_SITE_OTHER): Payer: 59 | Admitting: Nurse Practitioner

## 2021-10-30 ENCOUNTER — Other Ambulatory Visit: Payer: Self-pay

## 2021-10-30 VITALS — BP 111/73 | HR 99 | Wt 187.6 lb

## 2021-10-30 DIAGNOSIS — O24419 Gestational diabetes mellitus in pregnancy, unspecified control: Secondary | ICD-10-CM | POA: Insufficient documentation

## 2021-10-30 DIAGNOSIS — O0993 Supervision of high risk pregnancy, unspecified, third trimester: Secondary | ICD-10-CM

## 2021-10-30 DIAGNOSIS — O24410 Gestational diabetes mellitus in pregnancy, diet controlled: Secondary | ICD-10-CM

## 2021-10-30 DIAGNOSIS — O0990 Supervision of high risk pregnancy, unspecified, unspecified trimester: Secondary | ICD-10-CM

## 2021-10-30 NOTE — Progress Notes (Signed)
Patient ultrasound scheduled for 11/13/21 at 10:45 AM. Patient notified.

## 2021-10-31 ENCOUNTER — Encounter: Payer: Medicaid Other | Attending: Certified Nurse Midwife | Admitting: Registered"

## 2021-10-31 ENCOUNTER — Other Ambulatory Visit: Payer: Self-pay | Admitting: Lactation Services

## 2021-10-31 ENCOUNTER — Ambulatory Visit: Payer: 59 | Admitting: Registered"

## 2021-10-31 DIAGNOSIS — Z3A00 Weeks of gestation of pregnancy not specified: Secondary | ICD-10-CM | POA: Diagnosis not present

## 2021-10-31 DIAGNOSIS — O24419 Gestational diabetes mellitus in pregnancy, unspecified control: Secondary | ICD-10-CM | POA: Insufficient documentation

## 2021-10-31 DIAGNOSIS — O24410 Gestational diabetes mellitus in pregnancy, diet controlled: Secondary | ICD-10-CM

## 2021-10-31 MED ORDER — ACCU-CHEK GUIDE VI STRP: ORAL_STRIP | 12 refills | Status: DC

## 2021-10-31 NOTE — Progress Notes (Signed)
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   Pt states she is also avoiding pineapple and and seafood due to reading that they shouldn't be eaten during pregnancy. Pineapple is fine in small amounts and fish okay 2x/week.  Physical Activity: ADLs Stress: not assessed Sleep: not assessed  24 hr Recall:  First Meal: toast 2 eggs, 2 strips of bacon, diluted juice Snack: Second meal: plain bagel with plain cream cheese, bag of almonds Snack: Third meal: Caesar salad Snack: blueberries Beverages:  NUTRITION INTERVENTION  Nutrition education (E-1) on the following topics:   Initial Follow-up  [x]  []  Definition of Gestational Diabetes [x]  []  Why dietary management is important in controlling blood glucose [x]  []  Effects each nutrient has on blood glucose levels [x]  []  Simple carbohydrates vs complex carbohydrates []  []  Fluid intake [x]  []  Creating a balanced meal plan [x]  []  Carbohydrate counting  [x]  []  When to check blood glucose levels [x]  []  Proper blood glucose monitoring techniques [x]  []  Effect of stress and stress reduction techniques  [x]  []  Exercise effect on blood glucose levels, appropriate exercise during pregnancy [x]  []  Importance of limiting caffeine and abstaining from alcohol and smoking []  []  Medications used for blood sugar control during pregnancy [x]  []  Hypoglycemia and rule of 15 [x]  []  Postpartum self care  Blood glucose monitor given: Accu-chek Guide Me Lot Exp: 12/20/2022 CBG: 99 mg/dL  (pt states she is fasting)  Patient instructed to monitor glucose  levels: FBS: 60 - ? 95 mg/dL (some clinics use 90 for cutoff) 1 hour: ? 140 mg/dL 2 hour: ? mg/dL  Patient received handouts: Nutrition Diabetes and Pregnancy Carbohydrate Counting List  Patient will be seen for follow-up as needed.

## 2021-10-31 NOTE — Progress Notes (Signed)
° ° °  Subjective:  Marilyn Casey is a 30 y.o. [id number] at [redacted]w[redacted]d being seen today for ongoing prenatal care.  She is currently monitored for the following issues for this high-risk pregnancy and has Supervision of high risk pregnancy, antepartum; History of C-section; History of pre-eclampsia; and Gestational diabetes on their problem list.  Patient reports no complaints.  Contractions: Not present. Vag. Bleeding: None.  Movement: Present. Denies leaking of fluid.   The following portions of the patient's history were reviewed and updated as appropriate: allergies, current medications, past family history, past medical history, past social history, past surgical history and problem list. Problem list updated.  Objective:   Vitals:   10/30/21 1634  BP: 111/73  Pulse: 99  Weight: 187 lb 9.6 oz (85.1 kg)    Fetal Status: Fetal Heart Rate (bpm): 142 Fundal Height: 32 cm Movement: Present     General:  Alert, oriented and cooperative. Patient is in no acute distress.  Skin: Skin is warm and dry. No rash noted.   Cardiovascular: Normal heart rate noted  Respiratory: Normal respiratory effort, no problems with respiration noted  Abdomen: Soft, gravid, appropriate for gestational age. Pain/Pressure: Absent     Pelvic:  Cervical exam deferred        Extremities: Normal range of motion.  Edema: Trace  Mental Status: Normal mood and affect. Normal behavior. Normal judgment and thought content.   Urinalysis:      Assessment and Plan:  Pregnancy: A6T0160 at [redacted]w[redacted]d  1. Encounter for supervision of high-risk first pregnancy in third trimester Doing well Reflux is improved with medication Is calling to get records of previous C/S as no records have come with ROI Will need to discuss delivery plan with MD  2. Gestational diabetes mellitus (GDM), antepartum, gestational diabetes method of control unspecified   3. Diet controlled gestational diabetes mellitus (GDM) in third trimester To see  diabetes counselor tomorrow  - [place] MFM OB FOLLOW UP; Future  Preterm labor symptoms and general obstetric precautions including but not limited to vaginal bleeding, contractions, leaking of fluid and fetal movement were reviewed in detail with the patient. Please refer to After Visit Summary for other counseling recommendations.  Return in about 2 weeks (around 11/13/2021) for ROB WITH MD for Delivery Plan.  Nolene Bernheim, RN, MSN, NP-BC Nurse Practitioner, [facility] for [employer], [facility] 10/31/2021 4:52 PM

## 2021-11-06 ENCOUNTER — Other Ambulatory Visit: Payer: Self-pay

## 2021-11-13 ENCOUNTER — Other Ambulatory Visit: Payer: Self-pay | Admitting: *Deleted

## 2021-11-13 ENCOUNTER — Other Ambulatory Visit: Payer: Self-pay

## 2021-11-13 ENCOUNTER — Ambulatory Visit: Payer: Medicaid Other | Admitting: *Deleted

## 2021-11-13 ENCOUNTER — Encounter: Payer: Self-pay | Admitting: *Deleted

## 2021-11-13 ENCOUNTER — Ambulatory Visit: Payer: Medicaid Other | Attending: Nurse Practitioner

## 2021-11-13 VITALS — BP 131/77 | HR 77

## 2021-11-13 DIAGNOSIS — O24410 Gestational diabetes mellitus in pregnancy, diet controlled: Secondary | ICD-10-CM

## 2021-11-13 DIAGNOSIS — O34219 Maternal care for unspecified type scar from previous cesarean delivery: Secondary | ICD-10-CM | POA: Diagnosis not present

## 2021-11-13 DIAGNOSIS — O283 Abnormal ultrasonic finding on antenatal screening of mother: Secondary | ICD-10-CM

## 2021-11-13 DIAGNOSIS — Z8759 Personal history of other complications of pregnancy, childbirth and the puerperium: Secondary | ICD-10-CM | POA: Diagnosis present

## 2021-11-13 DIAGNOSIS — O09293 Supervision of pregnancy with other poor reproductive or obstetric history, third trimester: Secondary | ICD-10-CM

## 2021-11-13 DIAGNOSIS — Z3A32 32 weeks gestation of pregnancy: Secondary | ICD-10-CM | POA: Diagnosis not present

## 2021-11-13 DIAGNOSIS — O0990 Supervision of high risk pregnancy, unspecified, unspecified trimester: Secondary | ICD-10-CM

## 2021-11-13 DIAGNOSIS — Z98891 History of uterine scar from previous surgery: Secondary | ICD-10-CM | POA: Diagnosis present

## 2021-11-15 ENCOUNTER — Other Ambulatory Visit: Payer: Self-pay

## 2021-11-15 ENCOUNTER — Encounter: Payer: Self-pay | Admitting: Obstetrics and Gynecology

## 2021-11-15 ENCOUNTER — Ambulatory Visit (INDEPENDENT_AMBULATORY_CARE_PROVIDER_SITE_OTHER): Payer: 59 | Admitting: Obstetrics and Gynecology

## 2021-11-15 VITALS — BP 117/84 | HR 85 | Wt 188.5 lb

## 2021-11-15 DIAGNOSIS — O24410 Gestational diabetes mellitus in pregnancy, diet controlled: Secondary | ICD-10-CM

## 2021-11-15 DIAGNOSIS — O0990 Supervision of high risk pregnancy, unspecified, unspecified trimester: Secondary | ICD-10-CM

## 2021-11-15 DIAGNOSIS — Z98891 History of uterine scar from previous surgery: Secondary | ICD-10-CM

## 2021-11-15 NOTE — Patient Instructions (Signed)

## 2021-11-15 NOTE — Progress Notes (Signed)
Subjective:  Marilyn Casey is a 31 y.o. [id number] at [redacted]w[redacted]d being seen today for ongoing prenatal care.  She is currently monitored for the following issues for this high-risk pregnancy and has Supervision of high risk pregnancy, antepartum; History of C-section; History of pre-eclampsia; and Gestational diabetes on their problem list.  Patient reports general discomforts of pregnancy.  Contractions: Not present. Vag. Bleeding: None.  Movement: Present. Denies leaking of fluid.   The following portions of the patient's history were reviewed and updated as appropriate: allergies, current medications, past family history, past medical history, past social history, past surgical history and problem list. Problem list updated.  Objective:   Vitals:   11/15/21 0853  BP: 117/84  Pulse: 85  Weight: 188 lb 8 oz (85.5 kg)    Fetal Status: Fetal Heart Rate (bpm): 145 Fundal Height: 33 cm Movement: Present     General:  Alert, oriented and cooperative. Patient is in no acute distress.  Skin: Skin is warm and dry. No rash noted.   Cardiovascular: Normal heart rate noted  Respiratory: Normal respiratory effort, no problems with respiration noted  Abdomen: Soft, gravid, appropriate for gestational age. Pain/Pressure: Absent     Pelvic:  Cervical exam deferred        Extremities: Normal range of motion.     Mental Status: Normal mood and affect. Normal behavior. Normal judgment and thought content.   Urinalysis:      Assessment and Plan:  Pregnancy: [id number] at [redacted]w[redacted]d  1. Supervision of high risk pregnancy, antepartum Stable Declined Tdap  2. Diet controlled gestational diabetes mellitus (GDM) in third trimester CBG's in goal range Continue with diet Growth 37 % 11/13/21 F/U growth scan at 36 weeks  3. H/O C section x 2 Desires repeat c section. Papers signed today  Preterm labor symptoms and general obstetric precautions including but not limited to vaginal bleeding, contractions, leaking of  fluid and fetal movement were reviewed in detail with the patient. Please refer to After Visit Summary for other counseling recommendations.  Return in about 2 weeks (around 11/29/2021) for OB visit, face to face, MD only.   Chancy Milroy, MD

## 2021-11-20 ENCOUNTER — Encounter: Payer: 59 | Admitting: Student

## 2021-11-21 ENCOUNTER — Other Ambulatory Visit: Payer: Self-pay

## 2021-11-21 ENCOUNTER — Inpatient Hospital Stay (HOSPITAL_COMMUNITY)
Admission: AD | Admit: 2021-11-21 | Discharge: 2021-11-21 | Disposition: A | Discharge status: Home / Self Care | Payer: Medicaid Other | Attending: Obstetrics & Gynecology | Admitting: Obstetrics & Gynecology

## 2021-11-21 DIAGNOSIS — O98513 Other viral diseases complicating pregnancy, third trimester: Secondary | ICD-10-CM

## 2021-11-21 DIAGNOSIS — Z3A33 33 weeks gestation of pregnancy: Secondary | ICD-10-CM

## 2021-11-21 DIAGNOSIS — O98519 Other viral diseases complicating pregnancy, unspecified trimester: Secondary | ICD-10-CM | POA: Diagnosis present

## 2021-11-21 DIAGNOSIS — U071 COVID-19: Secondary | ICD-10-CM | POA: Diagnosis not present

## 2021-11-21 MED ORDER — NIRMATRELVIR/RITONAVIR (PAXLOVID)TABLET
3.0000 | ORAL_TABLET | Freq: Two times a day (BID) | ORAL | 0 refills | Status: DC
Start: 2021-11-21 — End: 2021-11-22

## 2021-11-21 NOTE — MAU Provider Note (Signed)
None     S Ms. Jaelah Thibodaux is a 31 y.o. [id number] patient who presents to MAU today with complaint of positive COVID home test earlier today.   Having congestion, sore throat, and cough. Wondering what she can take.  No obstetrical complaints. No bleeding or loss of fluid. No contractions. Fetal movement is normal.   O BP 125/80    Pulse (!) 114    Temp 99.7 F (37.6 C)    Resp 18    LMP  (LMP Unknown) Comment: Had miscarriage at end of May, then had  intercourse Mid June and home pregnancy test at end of June 2022 Physical Exam Vitals reviewed.  Constitutional:      General: She is not in acute distress.    Appearance: She is well-developed. She is not diaphoretic.  Eyes:     General: No scleral icterus. Pulmonary:     Effort: Pulmonary effort is normal. No respiratory distress.  Skin:    General: Skin is warm and dry.  Neurological:     Mental Status: She is alert.     Coordination: Coordination normal.    A Medical screening exam complete COVID 19 Infection  P Counseled and accepts prescription for Paxlovid Given list of OTC meds that are safe in pregnancy Focus on hydration, supportive measures Warning signs for worsening condition that would warrant emergency follow-up discussed Patient may return to MAU as needed   Venora Maples, MD 11/21/2021 5:44 PM

## 2021-11-21 NOTE — MAU Note (Signed)
Pt tested positive for covid. Started having sore throat and nasal congestion, and cough and headache. Symptoms started this morning. Not sure what she can take. Tried to call office and they told her she could not come there . All she wanted to know is what she could take. C/O of some chest discomfort when she coughs.  Good fetal movement felt. Temp 99.0 at home.

## 2021-11-21 NOTE — Discharge Instructions (Signed)

## 2021-11-22 ENCOUNTER — Other Ambulatory Visit: Payer: Self-pay

## 2021-11-22 DIAGNOSIS — U071 COVID-19: Secondary | ICD-10-CM

## 2021-11-22 MED ORDER — NIRMATRELVIR/RITONAVIR (PAXLOVID)TABLET: 3.0000 | ORAL_TABLET | Freq: Two times a day (BID) | ORAL | 0 refills | Status: AC

## 2021-11-29 ENCOUNTER — Encounter: Payer: 59 | Admitting: Family Medicine

## 2021-12-02 ENCOUNTER — Ambulatory Visit (INDEPENDENT_AMBULATORY_CARE_PROVIDER_SITE_OTHER): Payer: 59 | Admitting: Obstetrics & Gynecology

## 2021-12-02 ENCOUNTER — Encounter: Payer: Self-pay | Admitting: Obstetrics & Gynecology

## 2021-12-02 ENCOUNTER — Other Ambulatory Visit: Payer: Self-pay

## 2021-12-02 VITALS — BP 114/76 | HR 105 | Wt 191.1 lb

## 2021-12-02 DIAGNOSIS — Z3A35 35 weeks gestation of pregnancy: Secondary | ICD-10-CM

## 2021-12-02 DIAGNOSIS — O24410 Gestational diabetes mellitus in pregnancy, diet controlled: Secondary | ICD-10-CM

## 2021-12-02 DIAGNOSIS — O0990 Supervision of high risk pregnancy, unspecified, unspecified trimester: Secondary | ICD-10-CM

## 2021-12-02 DIAGNOSIS — Z8759 Personal history of other complications of pregnancy, childbirth and the puerperium: Secondary | ICD-10-CM

## 2021-12-02 DIAGNOSIS — Z98891 History of uterine scar from previous surgery: Secondary | ICD-10-CM

## 2021-12-02 NOTE — Patient Instructions (Signed)
Return to office for any scheduled appointments. Call the office or go to the MAU at [facility] if:  You begin to have strong, frequent contractions  Your water breaks.  Sometimes it is a big gush of fluid, sometimes it is just a trickle that keeps getting your panties wet or running down your legs  You have vaginal bleeding.  It is normal to have a small amount of spotting if your cervix was checked.   You do not feel your baby moving like normal.  If you do not, get something to eat and drink and lay down and focus on feeling your baby move.   If your baby is still not moving like normal, you should call the office or go to MAU.  Any other obstetric concerns.   

## 2021-12-02 NOTE — Progress Notes (Signed)
° °  PRENATAL VISIT NOTE  Subjective:  Marilyn Casey is a 31 y.o. [id number] at [redacted]w[redacted]d being seen today for ongoing prenatal care.  She is currently monitored for the following issues for this high-risk pregnancy and has Supervision of high risk pregnancy, antepartum; History of C-section; History of pre-eclampsia; and Gestational diabetes on their problem list.  Patient reports no complaints.  Contractions: Not present. Vag. Bleeding: None.  Movement: Present. Denies leaking of fluid.   The following portions of the patient's history were reviewed and updated as appropriate: allergies, current medications, past family history, past medical history, past social history, past surgical history and problem list.   Objective:   Vitals:   12/02/21 0834  BP: 114/76  Pulse: (!) 105  Weight: 191 lb 1.6 oz (86.7 kg)    Fetal Status: Fetal Heart Rate (bpm): 148   Movement: Present     General:  Alert, oriented and cooperative. Patient is in no acute distress.  Skin: Skin is warm and dry. No rash noted.   Cardiovascular: Normal heart rate noted  Respiratory: Normal respiratory effort, no problems with respiration noted  Abdomen: Soft, gravid, appropriate for gestational age.  Pain/Pressure: Absent     Pelvic: Cervical exam deferred        Extremities: Normal range of motion.  Edema: Trace  Mental Status: Normal mood and affect. Normal behavior. Normal judgment and thought content.   Assessment and Plan:  Pregnancy: [id number] at [redacted]w[redacted]d 1. Diet controlled gestational diabetes mellitus (GDM) in third trimester Did not bring log, but reports fastings around 49s, PP 90s. Continue diet. Growth scan scheduled 12/10/2021.  2. History of C-section Scheduled on 12/28/2021  3. History of pre-eclampsia Normal BP, will continue to monitor  4. [redacted] weeks gestation of pregnancy 5. Supervision of high risk pregnancy, antepartum Pelvic cultures next visit.  Preterm labor symptoms and general obstetric precautions  including but not limited to vaginal bleeding, contractions, leaking of fluid and fetal movement were reviewed in detail with the patient. Please refer to After Visit Summary for other counseling recommendations.   Return in about 1 week (around 12/09/2021) for OFFICE OB VISIT (MD only), Pelvic cultures.  Future Appointments  Date Time Provider [place]  12/10/2021  7:45 AM [facility] NURSE [facility]  12/10/2021  8:00 AM [facility] US1 [facility] [place]    Verita Schneiders, MD

## 2021-12-09 ENCOUNTER — Other Ambulatory Visit: Payer: Self-pay

## 2021-12-09 ENCOUNTER — Encounter: Payer: Self-pay | Admitting: Family Medicine

## 2021-12-09 ENCOUNTER — Other Ambulatory Visit (HOSPITAL_COMMUNITY)
Admission: RE | Admit: 2021-12-09 | Discharge: 2021-12-09 | Disposition: A | Discharge status: Home / Self Care | Payer: Medicaid Other | Source: Ambulatory Visit | Attending: Family Medicine | Admitting: Family Medicine

## 2021-12-09 ENCOUNTER — Ambulatory Visit (INDEPENDENT_AMBULATORY_CARE_PROVIDER_SITE_OTHER): Payer: Medicaid Other | Admitting: Family Medicine

## 2021-12-09 VITALS — BP 138/84 | HR 93 | Wt 195.7 lb

## 2021-12-09 DIAGNOSIS — Z98891 History of uterine scar from previous surgery: Secondary | ICD-10-CM

## 2021-12-09 DIAGNOSIS — O24410 Gestational diabetes mellitus in pregnancy, diet controlled: Secondary | ICD-10-CM | POA: Diagnosis not present

## 2021-12-09 DIAGNOSIS — O0990 Supervision of high risk pregnancy, unspecified, unspecified trimester: Secondary | ICD-10-CM

## 2021-12-09 DIAGNOSIS — Z3A36 36 weeks gestation of pregnancy: Secondary | ICD-10-CM

## 2021-12-09 DIAGNOSIS — Z8759 Personal history of other complications of pregnancy, childbirth and the puerperium: Secondary | ICD-10-CM

## 2021-12-09 NOTE — Progress Notes (Signed)
° °  PRENATAL VISIT NOTE  Subjective:  Marilyn Casey is a 31 y.o. [id number] at [redacted]w[redacted]d being seen today for ongoing prenatal care.  She is currently monitored for the following issues for this high-risk pregnancy and has Supervision of high risk pregnancy, antepartum; History of C-section; History of pre-eclampsia; and Gestational diabetes on their problem list.  Patient reports no complaints.  Contractions: Not present. Vag. Bleeding: None.  Movement: Present. Denies leaking of fluid.   The following portions of the patient's history were reviewed and updated as appropriate: allergies, current medications, past family history, past medical history, past social history, past surgical history and problem list.   Objective:   Vitals:   12/09/21 1541 12/09/21 1613  BP: (!) 138/91 138/84  Pulse: 93   Weight: 195 lb 11.2 oz (88.8 kg)     Fetal Status: Fetal Heart Rate (bpm): 138 Fundal Height: 36 cm Movement: Present  General:  Alert, oriented and cooperative. Patient is in no acute distress.  Skin: Skin is warm and dry. No rash noted.   Cardiovascular: Normal heart rate noted.  Respiratory: Normal respiratory effort, no problems with respiration noted.  Abdomen: Soft, gravid, appropriate for gestational age.       Pelvic: Cervical exam deferred. Normal external genitalia and perineum. Vaginal swabs obtained with chaperone present.   Extremities: Normal range of motion.  Mental Status: Normal mood and affect. Normal behavior. Normal judgment and thought content.   Assessment and Plan:  Pregnancy: [id number] at [redacted]w[redacted]d  1. Supervision of high risk pregnancy, antepartum 2. [redacted] weeks gestation of pregnancy Progressing well. FH and FHT within normal limits. Vaginal swabs obtained today as below, will follow up results. Follow up HR OB visit in 1 week.  - Culture, beta strep (group b only) - GC/Chlamydia probe amp ([place])not at [facility]  3. Diet controlled gestational diabetes mellitus (GDM),  antepartum Did not bring log today but reports fasting glucose 85 this morning, similar to most days. All postprandial values reported < 120. Last growth [place] 37%. Has follow up growth [place] tomorrow. Encouraged patient to bring her log next visit.   4. History of C-section Scheduled for repeat CS on 12/28/21.  5. History of pre-eclampsia BP mildly elevated today with diastolic of 91. Repeat improved. No symptoms of pre-E. Previously had all normal blood pressures. CBC, CMP, and UPC ordered today. Will follow up results. Reassess BP at next visit. Return precautions reviewed and patient voiced understanding.  Preterm labor symptoms and general obstetric precautions including but not limited to vaginal bleeding, contractions, leaking of fluid and fetal movement were reviewed in detail with the patient.  Please refer to After Visit Summary for other counseling recommendations.   Return in about 1 week (around 12/16/2021) for follow up HR OB visit.  Future Appointments  Date Time Provider [place]  12/10/2021  7:45 AM [facility] NURSE [facility]  12/10/2021  8:00 AM [facility] US1 [facility]  12/16/2021  9:35 AM Chancy Milroy, MD [facility]    Genia Del, MD

## 2021-12-09 NOTE — Progress Notes (Signed)
Fasting blood sugar this morning 85

## 2021-12-10 ENCOUNTER — Inpatient Hospital Stay (HOSPITAL_COMMUNITY): Payer: Medicaid Other | Admitting: Anesthesiology

## 2021-12-10 ENCOUNTER — Encounter (HOSPITAL_COMMUNITY)
Admission: AD | Disposition: A | Discharge status: Home / Self Care | Payer: Self-pay | Source: Home / Self Care | Attending: Obstetrics and Gynecology

## 2021-12-10 ENCOUNTER — Inpatient Hospital Stay (HOSPITAL_COMMUNITY)
Admission: AD | Admit: 2021-12-10 | Discharge: 2021-12-12 | DRG: 787 | Disposition: A | Discharge status: Home / Self Care | Payer: Medicaid Other | Attending: Obstetrics and Gynecology | Admitting: Obstetrics and Gynecology

## 2021-12-10 ENCOUNTER — Ambulatory Visit: Payer: Medicaid Other | Admitting: *Deleted

## 2021-12-10 ENCOUNTER — Encounter: Payer: Self-pay | Admitting: *Deleted

## 2021-12-10 ENCOUNTER — Inpatient Hospital Stay (HOSPITAL_BASED_OUTPATIENT_CLINIC_OR_DEPARTMENT_OTHER): Payer: Medicaid Other | Admitting: Maternal & Fetal Medicine

## 2021-12-10 ENCOUNTER — Ambulatory Visit (HOSPITAL_BASED_OUTPATIENT_CLINIC_OR_DEPARTMENT_OTHER): Payer: Medicaid Other

## 2021-12-10 ENCOUNTER — Encounter (HOSPITAL_COMMUNITY): Payer: Self-pay | Admitting: Obstetrics and Gynecology

## 2021-12-10 VITALS — BP 158/95 | HR 68

## 2021-12-10 DIAGNOSIS — O0990 Supervision of high risk pregnancy, unspecified, unspecified trimester: Secondary | ICD-10-CM

## 2021-12-10 DIAGNOSIS — Z87891 Personal history of nicotine dependence: Secondary | ICD-10-CM

## 2021-12-10 DIAGNOSIS — O34219 Maternal care for unspecified type scar from previous cesarean delivery: Secondary | ICD-10-CM | POA: Diagnosis not present

## 2021-12-10 DIAGNOSIS — Z6791 Unspecified blood type, Rh negative: Secondary | ICD-10-CM

## 2021-12-10 DIAGNOSIS — O1413 Severe pre-eclampsia, third trimester: Secondary | ICD-10-CM | POA: Diagnosis not present

## 2021-12-10 DIAGNOSIS — Z8759 Personal history of other complications of pregnancy, childbirth and the puerperium: Secondary | ICD-10-CM

## 2021-12-10 DIAGNOSIS — O9081 Anemia of the puerperium: Secondary | ICD-10-CM | POA: Diagnosis not present

## 2021-12-10 DIAGNOSIS — O34211 Maternal care for low transverse scar from previous cesarean delivery: Secondary | ICD-10-CM | POA: Diagnosis not present

## 2021-12-10 DIAGNOSIS — Z8616 Personal history of COVID-19: Secondary | ICD-10-CM | POA: Diagnosis not present

## 2021-12-10 DIAGNOSIS — Z98891 History of uterine scar from previous surgery: Secondary | ICD-10-CM | POA: Insufficient documentation

## 2021-12-10 DIAGNOSIS — O1410 Severe pre-eclampsia, unspecified trimester: Secondary | ICD-10-CM | POA: Diagnosis present

## 2021-12-10 DIAGNOSIS — D62 Acute posthemorrhagic anemia: Secondary | ICD-10-CM | POA: Diagnosis not present

## 2021-12-10 DIAGNOSIS — Z3A36 36 weeks gestation of pregnancy: Secondary | ICD-10-CM

## 2021-12-10 DIAGNOSIS — O283 Abnormal ultrasonic finding on antenatal screening of mother: Secondary | ICD-10-CM | POA: Insufficient documentation

## 2021-12-10 DIAGNOSIS — O24410 Gestational diabetes mellitus in pregnancy, diet controlled: Secondary | ICD-10-CM | POA: Diagnosis not present

## 2021-12-10 DIAGNOSIS — O1414 Severe pre-eclampsia complicating childbirth: Secondary | ICD-10-CM | POA: Diagnosis not present

## 2021-12-10 DIAGNOSIS — D509 Iron deficiency anemia, unspecified: Secondary | ICD-10-CM

## 2021-12-10 DIAGNOSIS — O99019 Anemia complicating pregnancy, unspecified trimester: Secondary | ICD-10-CM

## 2021-12-10 DIAGNOSIS — O26899 Other specified pregnancy related conditions, unspecified trimester: Secondary | ICD-10-CM

## 2021-12-10 DIAGNOSIS — O1493 Unspecified pre-eclampsia, third trimester: Secondary | ICD-10-CM

## 2021-12-10 DIAGNOSIS — O26893 Other specified pregnancy related conditions, third trimester: Secondary | ICD-10-CM | POA: Diagnosis not present

## 2021-12-10 DIAGNOSIS — O24420 Gestational diabetes mellitus in childbirth, diet controlled: Secondary | ICD-10-CM | POA: Diagnosis not present

## 2021-12-10 DIAGNOSIS — O09293 Supervision of pregnancy with other poor reproductive or obstetric history, third trimester: Secondary | ICD-10-CM

## 2021-12-10 DIAGNOSIS — O24419 Gestational diabetes mellitus in pregnancy, unspecified control: Secondary | ICD-10-CM | POA: Diagnosis present

## 2021-12-10 HISTORY — DX: Gestational diabetes mellitus in pregnancy, unspecified control: O24.419

## 2021-12-10 HISTORY — DX: Anemia, unspecified: D64.9

## 2021-12-10 LAB — COMPREHENSIVE METABOLIC PANEL
ALT: 160 IU/L — ABNORMAL HIGH (ref 0–32)
ALT: 135 U/L — ABNORMAL HIGH (ref 0–44)
AST: 129 IU/L — ABNORMAL HIGH (ref 0–40)
AST: 102 U/L — ABNORMAL HIGH (ref 15–41)
Albumin/Globulin Ratio: 1.1 — ABNORMAL LOW (ref 1.2–2.2)
Albumin: 3.3 g/dL — ABNORMAL LOW (ref 3.8–4.8)
Albumin: 2.5 g/dL — ABNORMAL LOW (ref 3.5–5.0)
Alkaline Phosphatase: 208 IU/L — ABNORMAL HIGH (ref 44–121)
Alkaline Phosphatase: 171 U/L — ABNORMAL HIGH (ref 38–126)
Anion gap: 8 (ref 5–15)
BUN/Creatinine Ratio: 8 — ABNORMAL LOW (ref 9–23)
BUN: 6 mg/dL (ref 6–20)
BUN: 6 mg/dL (ref 6–20)
Bilirubin Total: 0.2 mg/dL (ref 0.0–1.2)
CO2: 20 mmol/L (ref 20–29)
CO2: 21 mmol/L — ABNORMAL LOW (ref 22–32)
Calcium: 9.1 mg/dL (ref 8.7–10.2)
Calcium: 8.8 mg/dL — ABNORMAL LOW (ref 8.9–10.3)
Chloride: 106 mmol/L (ref 96–106)
Chloride: 108 mmol/L (ref 98–111)
Creatinine, Ser: 0.77 mg/dL (ref 0.57–1.00)
Creatinine, Ser: 0.78 mg/dL (ref 0.44–1.00)
GFR, Estimated: >60 mL/min (ref >60)
Globulin, Total: 2.9 g/dL (ref 1.5–4.5)
Glucose, Bld: 90 mg/dL (ref 70–99)
Glucose: 94 mg/dL (ref 70–99)
Potassium: 4.2 mmol/L (ref 3.5–5.2)
Potassium: 3.8 mmol/L (ref 3.5–5.1)
Sodium: 141 mmol/L (ref 134–144)
Sodium: 137 mmol/L (ref 135–145)
Total Bilirubin: 0.2 mg/dL — ABNORMAL LOW (ref 0.3–1.2)
Total Protein: 6.2 g/dL (ref 6.0–8.5)
Total Protein: 6.3 g/dL — ABNORMAL LOW (ref 6.5–8.1)
eGFR: 105 mL/min/{1.73_m2} (ref >59)

## 2021-12-10 LAB — CBC
HCT: 31.7 % — ABNORMAL LOW (ref 36.0–46.0)
Hematocrit: 30.9 % — ABNORMAL LOW (ref 34.0–46.6)
Hemoglobin: 10.1 g/dL — ABNORMAL LOW (ref 11.1–15.9)
Hemoglobin: 10.5 g/dL — ABNORMAL LOW (ref 12.0–15.0)
MCH: 27.5 pg (ref 26.6–33.0)
MCH: 28.6 pg (ref 26.0–34.0)
MCHC: 32.7 g/dL (ref 31.5–35.7)
MCHC: 33.1 g/dL (ref 30.0–36.0)
MCV: 84 fL (ref 79–97)
MCV: 86.4 fL (ref 80.0–100.0)
Platelets: 445 10*3/uL (ref 150–450)
Platelets: 409 10*3/uL — ABNORMAL HIGH (ref 150–400)
RBC: 3.67 x10E6/uL — ABNORMAL LOW (ref 3.77–5.28)
RBC: 3.67 MIL/uL — ABNORMAL LOW (ref 3.87–5.11)
RDW: 12.9 % (ref 11.7–15.4)
RDW: 13.3 % (ref 11.5–15.5)
WBC: 5.8 10*3/uL (ref 3.4–10.8)
WBC: 5.8 10*3/uL (ref 4.0–10.5)
nRBC: 0 % (ref 0.0–0.2)

## 2021-12-10 LAB — TYPE AND SCREEN
ABO/RH(D): O NEG
Antibody Screen: POSITIVE

## 2021-12-10 LAB — RESP PANEL BY RT-PCR (FLU A&B, COVID) ARPGX2
Influenza A by PCR: NEGATIVE
Influenza B by PCR: NEGATIVE
SARS Coronavirus 2 by RT PCR: POSITIVE — AB

## 2021-12-10 LAB — PROTEIN / CREATININE RATIO, URINE
Creatinine, Urine: 187.9 mg/dL
Creatinine, Urine: 53.99 mg/dL
Protein Creatinine Ratio: 0.52 mg/mg{Cre} — ABNORMAL HIGH (ref 0.00–0.15)
Protein, Ur: 106.5 mg/dL
Protein/Creat Ratio: 567 mg/g creat — ABNORMAL HIGH (ref 0–200)
Total Protein, Urine: 28 mg/dL

## 2021-12-10 LAB — RAPID HIV SCREEN (HIV 1/2 AB+AG)
HIV 1/2 Antibodies: NONREACTIVE
HIV-1 P24 Antigen - HIV24: NONREACTIVE

## 2021-12-10 LAB — GLUCOSE, CAPILLARY: Glucose-Capillary: 77 mg/dL (ref 70–99)

## 2021-12-10 SURGERY — Surgical Case
Anesthesia: Spinal | Wound class: Clean Contaminated

## 2021-12-10 SURGERY — Surgical Case
Anesthesia: Regional

## 2021-12-10 MED ORDER — PRENATAL MULTIVITAMIN CH
1.0000 | ORAL_TABLET | Freq: Every day | ORAL | Status: DC
  Administered 2021-12-11 – 2021-12-12 (×2): 1 via ORAL
  Filled 2021-12-10 (×3): qty 1

## 2021-12-10 MED ORDER — MORPHINE SULFATE (PF) 0.5 MG/ML IJ SOLN
INTRAMUSCULAR | Status: DC | PRN
  Administered 2021-12-10: 150 ug via INTRATHECAL

## 2021-12-10 MED ORDER — DIPHENHYDRAMINE HCL 50 MG/ML IJ SOLN: 12.5000 mg | INTRAMUSCULAR | Status: DC | PRN

## 2021-12-10 MED ORDER — DIPHENHYDRAMINE HCL 25 MG PO CAPS: 25.0000 mg | ORAL_CAPSULE | ORAL | Status: DC | PRN

## 2021-12-10 MED ORDER — OXYCODONE HCL 5 MG PO TABS: 5.0000 mg | ORAL_TABLET | Freq: Once | ORAL | Status: DC | PRN

## 2021-12-10 MED ORDER — IBUPROFEN 600 MG PO TABS
600.0000 mg | ORAL_TABLET | Freq: Four times a day (QID) | ORAL | Status: DC
  Administered 2021-12-11 – 2021-12-12 (×4): 600 mg via ORAL
  Filled 2021-12-10 (×5): qty 1

## 2021-12-10 MED ORDER — PHENYLEPHRINE HCL-NACL 20-0.9 MG/250ML-% IV SOLN
INTRAVENOUS | Status: DC | PRN
  Administered 2021-12-10: 60 ug/min via INTRAVENOUS

## 2021-12-10 MED ORDER — BUPIVACAINE IN DEXTROSE 0.75-8.25 % IT SOLN
INTRATHECAL | Status: DC | PRN
Start: 2021-12-10 — End: 2021-12-10
  Administered 2021-12-10: 1.6 mL via INTRATHECAL

## 2021-12-10 MED ORDER — POVIDONE-IODINE 10 % EX SWAB: 2.0000 "application " | Freq: Once | CUTANEOUS | Status: DC

## 2021-12-10 MED ORDER — AMISULPRIDE (ANTIEMETIC) 5 MG/2ML IV SOLN
10.0000 mg | Freq: Once | INTRAVENOUS | Status: DC | PRN
  Filled 2021-12-10: qty 4

## 2021-12-10 MED ORDER — PROMETHAZINE HCL 25 MG/ML IJ SOLN: 6.2500 mg | INTRAMUSCULAR | Status: DC | PRN

## 2021-12-10 MED ORDER — DEXMEDETOMIDINE (PRECEDEX) IN NS 20 MCG/5ML (4 MCG/ML) IV SYRINGE
PREFILLED_SYRINGE | INTRAVENOUS | Status: DC | PRN
  Administered 2021-12-10: 8 ug via INTRAVENOUS

## 2021-12-10 MED ORDER — MAGNESIUM SULFATE BOLUS VIA INFUSION
4.0000 g | Freq: Once | INTRAVENOUS | Status: AC
  Administered 2021-12-10: 4 g via INTRAVENOUS
  Filled 2021-12-10: qty 1000

## 2021-12-10 MED ORDER — FENTANYL CITRATE (PF) 100 MCG/2ML IJ SOLN
INTRAMUSCULAR | Status: DC | PRN
  Administered 2021-12-10: 15 ug via INTRATHECAL

## 2021-12-10 MED ORDER — OXYTOCIN-SODIUM CHLORIDE 30-0.9 UT/500ML-% IV SOLN
INTRAVENOUS | Status: DC | PRN
  Administered 2021-12-10 (×2): 100 mL via INTRAVENOUS

## 2021-12-10 MED ORDER — NALOXONE HCL 0.4 MG/ML IJ SOLN: 0.4000 mg | INTRAMUSCULAR | Status: DC | PRN

## 2021-12-10 MED ORDER — OXYCODONE HCL 5 MG/5ML PO SOLN: 5.0000 mg | Freq: Once | ORAL | Status: DC | PRN

## 2021-12-10 MED ORDER — MENTHOL 3 MG MT LOZG
1.0000 | LOZENGE | OROMUCOSAL | Status: DC | PRN
  Filled 2021-12-10: qty 9

## 2021-12-10 MED ORDER — MAGNESIUM SULFATE 40 GM/1000ML IV SOLN
2.0000 g/h | INTRAVENOUS | Status: DC
  Administered 2021-12-10: 2 g/h via INTRAVENOUS
  Filled 2021-12-10: qty 1000

## 2021-12-10 MED ORDER — MORPHINE SULFATE (PF) 0.5 MG/ML IJ SOLN
INTRAMUSCULAR | Status: AC
  Filled 2021-12-10: qty 10

## 2021-12-10 MED ORDER — SENNOSIDES-DOCUSATE SODIUM 8.6-50 MG PO TABS
2.0000 | ORAL_TABLET | Freq: Every day | ORAL | Status: DC
  Administered 2021-12-11 – 2021-12-12 (×2): 2 via ORAL
  Filled 2021-12-10 (×3): qty 2

## 2021-12-10 MED ORDER — ONDANSETRON HCL 4 MG/2ML IJ SOLN
INTRAMUSCULAR | Status: DC | PRN
Start: 2021-12-10 — End: 2021-12-10
  Administered 2021-12-10: 4 mg via INTRAVENOUS

## 2021-12-10 MED ORDER — ACETAMINOPHEN 10 MG/ML IV SOLN
1000.0000 mg | Freq: Once | INTRAVENOUS | Status: DC | PRN
  Administered 2021-12-10: 1000 mg via INTRAVENOUS

## 2021-12-10 MED ORDER — LACTATED RINGERS IV SOLN: 125.0000 mL/h | INTRAVENOUS | Status: DC

## 2021-12-10 MED ORDER — DEXAMETHASONE SODIUM PHOSPHATE 4 MG/ML IJ SOLN
INTRAMUSCULAR | Status: DC | PRN
  Administered 2021-12-10: 8 mg via INTRAVENOUS

## 2021-12-10 MED ORDER — MAGNESIUM SULFATE 40 GM/1000ML IV SOLN
INTRAVENOUS | Status: DC | PRN
  Administered 2021-12-10: 2 g/h via INTRAVENOUS

## 2021-12-10 MED ORDER — SODIUM CHLORIDE 0.9 % IR SOLN
Status: DC | PRN
  Administered 2021-12-10: 1000 mL

## 2021-12-10 MED ORDER — KETOROLAC TROMETHAMINE 30 MG/ML IJ SOLN
30.0000 mg | Freq: Four times a day (QID) | INTRAMUSCULAR | Status: AC | PRN
  Administered 2021-12-10: 30 mg via INTRAVENOUS

## 2021-12-10 MED ORDER — KETOROLAC TROMETHAMINE 30 MG/ML IJ SOLN
30.0000 mg | Freq: Four times a day (QID) | INTRAMUSCULAR | Status: AC | PRN
  Administered 2021-12-11: 30 mg via INTRAMUSCULAR

## 2021-12-10 MED ORDER — RHO D IMMUNE GLOBULIN 1500 UNIT/2ML IJ SOSY
300.0000 ug | PREFILLED_SYRINGE | Freq: Once | INTRAMUSCULAR | Status: DC
  Filled 2021-12-10: qty 2

## 2021-12-10 MED ORDER — NALOXONE HCL 4 MG/10ML IJ SOLN
1.0000 ug/kg/h | INTRAVENOUS | Status: DC | PRN
  Filled 2021-12-10: qty 5

## 2021-12-10 MED ORDER — COCONUT OIL OIL
1.0000 "application " | TOPICAL_OIL | Status: DC | PRN
  Administered 2021-12-12: 1 via TOPICAL
  Filled 2021-12-10: qty 120

## 2021-12-10 MED ORDER — MEPERIDINE HCL 25 MG/ML IJ SOLN: 6.2500 mg | INTRAMUSCULAR | Status: DC | PRN

## 2021-12-10 MED ORDER — FENTANYL CITRATE (PF) 100 MCG/2ML IJ SOLN: 25.0000 ug | INTRAMUSCULAR | Status: DC | PRN

## 2021-12-10 MED ORDER — ONDANSETRON HCL 4 MG/2ML IJ SOLN
INTRAMUSCULAR | Status: AC
  Filled 2021-12-10: qty 2

## 2021-12-10 MED ORDER — SODIUM CHLORIDE 0.9% FLUSH: 3.0000 mL | INTRAVENOUS | Status: DC | PRN

## 2021-12-10 MED ORDER — OXYTOCIN-SODIUM CHLORIDE 30-0.9 UT/500ML-% IV SOLN
2.5000 [IU]/h | INTRAVENOUS | Status: AC
  Administered 2021-12-10: 2.5 [IU]/h via INTRAVENOUS

## 2021-12-10 MED ORDER — DIPHENHYDRAMINE HCL 25 MG PO CAPS: 25.0000 mg | ORAL_CAPSULE | Freq: Four times a day (QID) | ORAL | Status: DC | PRN

## 2021-12-10 MED ORDER — MAGNESIUM SULFATE 40 GM/1000ML IV SOLN
2.0000 g/h | INTRAVENOUS | Status: AC
  Administered 2021-12-11: 2 g/h via INTRAVENOUS
  Filled 2021-12-10 (×2): qty 1000

## 2021-12-10 MED ORDER — DEXMEDETOMIDINE (PRECEDEX) IN NS 20 MCG/5ML (4 MCG/ML) IV SYRINGE
PREFILLED_SYRINGE | INTRAVENOUS | Status: AC
  Filled 2021-12-10: qty 5

## 2021-12-10 MED ORDER — ACETAMINOPHEN 500 MG PO TABS
1000.0000 mg | ORAL_TABLET | Freq: Four times a day (QID) | ORAL | Status: AC
  Administered 2021-12-11 (×2): 1000 mg via ORAL
  Filled 2021-12-10 (×3): qty 2

## 2021-12-10 MED ORDER — CEFAZOLIN SODIUM-DEXTROSE 2-4 GM/100ML-% IV SOLN
2.0000 g | INTRAVENOUS | Status: AC
  Administered 2021-12-10: 2 g via INTRAVENOUS
  Filled 2021-12-10: qty 100

## 2021-12-10 MED ORDER — DEXAMETHASONE SODIUM PHOSPHATE 4 MG/ML IJ SOLN
INTRAMUSCULAR | Status: AC
  Filled 2021-12-10: qty 1

## 2021-12-10 MED ORDER — OXYCODONE HCL 5 MG PO TABS
5.0000 mg | ORAL_TABLET | ORAL | Status: DC | PRN
  Administered 2021-12-11 – 2021-12-12 (×3): 10 mg via ORAL
  Filled 2021-12-10 (×3): qty 2

## 2021-12-10 MED ORDER — FENTANYL CITRATE (PF) 100 MCG/2ML IJ SOLN
INTRAMUSCULAR | Status: AC
  Filled 2021-12-10: qty 2

## 2021-12-10 MED ORDER — ACETAMINOPHEN 10 MG/ML IV SOLN
INTRAVENOUS | Status: AC
  Filled 2021-12-10: qty 100

## 2021-12-10 MED ORDER — SIMETHICONE 80 MG PO CHEW
80.0000 mg | CHEWABLE_TABLET | Freq: Three times a day (TID) | ORAL | Status: DC
  Administered 2021-12-11 – 2021-12-12 (×5): 80 mg via ORAL
  Filled 2021-12-10 (×6): qty 1

## 2021-12-10 MED ORDER — TETANUS-DIPHTH-ACELL PERTUSSIS 5-2.5-18.5 LF-MCG/0.5 IM SUSY
0.5000 mL | PREFILLED_SYRINGE | Freq: Once | INTRAMUSCULAR | Status: DC
  Filled 2021-12-10: qty 0.5

## 2021-12-10 MED ORDER — LACTATED RINGERS IV SOLN: INTRAVENOUS | Status: DC

## 2021-12-10 MED ORDER — SIMETHICONE 80 MG PO CHEW
80.0000 mg | CHEWABLE_TABLET | ORAL | Status: DC | PRN
  Administered 2021-12-12: 80 mg via ORAL
  Filled 2021-12-10 (×2): qty 1

## 2021-12-10 MED ORDER — ENOXAPARIN SODIUM 40 MG/0.4ML IJ SOSY
40.0000 mg | PREFILLED_SYRINGE | INTRAMUSCULAR | Status: DC
  Administered 2021-12-11 – 2021-12-12 (×2): 40 mg via SUBCUTANEOUS
  Filled 2021-12-10 (×2): qty 0.4

## 2021-12-10 MED ORDER — SCOPOLAMINE 1 MG/3DAYS TD PT72
1.0000 | MEDICATED_PATCH | Freq: Once | TRANSDERMAL | Status: AC
  Administered 2021-12-10: 1 via TRANSDERMAL

## 2021-12-10 MED ORDER — CEFAZOLIN SODIUM-DEXTROSE 2-3 GM-%(50ML) IV SOLR
INTRAVENOUS | Status: DC | PRN
  Administered 2021-12-10: 2 g via INTRAVENOUS

## 2021-12-10 MED ORDER — KETOROLAC TROMETHAMINE 30 MG/ML IJ SOLN
30.0000 mg | Freq: Four times a day (QID) | INTRAMUSCULAR | Status: AC
  Administered 2021-12-10 – 2021-12-11 (×2): 30 mg via INTRAVENOUS
  Filled 2021-12-10 (×3): qty 1

## 2021-12-10 MED ORDER — DIBUCAINE (PERIANAL) 1 % EX OINT
1.0000 "application " | TOPICAL_OINTMENT | CUTANEOUS | Status: DC | PRN
  Filled 2021-12-10: qty 28

## 2021-12-10 MED ORDER — KETOROLAC TROMETHAMINE 30 MG/ML IJ SOLN
INTRAMUSCULAR | Status: AC
  Filled 2021-12-10: qty 1

## 2021-12-10 MED ORDER — ONDANSETRON HCL 4 MG/2ML IJ SOLN: 4.0000 mg | Freq: Three times a day (TID) | INTRAMUSCULAR | Status: DC | PRN

## 2021-12-10 MED ORDER — STERILE WATER FOR IRRIGATION IR SOLN
Status: DC | PRN
  Administered 2021-12-10: 1000 mL

## 2021-12-10 MED ORDER — MEDROXYPROGESTERONE ACETATE 150 MG/ML IM SUSP
150.0000 mg | INTRAMUSCULAR | Status: AC | PRN
  Administered 2021-12-12: 150 mg via INTRAMUSCULAR
  Filled 2021-12-10: qty 1

## 2021-12-10 MED ORDER — SOD CITRATE-CITRIC ACID 500-334 MG/5ML PO SOLN
30.0000 mL | Freq: Once | ORAL | Status: AC
  Administered 2021-12-10: 30 mL via ORAL
  Filled 2021-12-10: qty 30

## 2021-12-10 MED ORDER — TRANEXAMIC ACID-NACL 1000-0.7 MG/100ML-% IV SOLN
INTRAVENOUS | Status: DC | PRN
Start: 2021-12-10 — End: 2021-12-10
  Administered 2021-12-10: 1000 mg via INTRAVENOUS

## 2021-12-10 MED ORDER — WITCH HAZEL-GLYCERIN EX PADS: 1.0000 "application " | MEDICATED_PAD | CUTANEOUS | Status: DC | PRN

## 2021-12-10 SURGICAL SUPPLY — 35 items
BENZOIN TINCTURE PRP APPL 2/3 (GAUZE/BANDAGES/DRESSINGS) ×2 IMPLANT
CHLORAPREP W/TINT 26ML (MISCELLANEOUS) ×2 IMPLANT
CLAMP CORD UMBIL (MISCELLANEOUS) IMPLANT
CLOTH BEACON ORANGE TIMEOUT ST (SAFETY) ×2 IMPLANT
DERMABOND ADHESIVE PROPEN (GAUZE/BANDAGES/DRESSINGS) ×1
DERMABOND ADVANCED .7 DNX6 (GAUZE/BANDAGES/DRESSINGS) IMPLANT
DRSG OPSITE POSTOP 4X10 (GAUZE/BANDAGES/DRESSINGS) ×2 IMPLANT
ELECT REM PT RETURN 9FT ADLT (ELECTROSURGICAL) ×2
ELECTRODE REM PT RTRN 9FT ADLT (ELECTROSURGICAL) ×1 IMPLANT
EXTRACTOR VACUUM M CUP 4 TUBE (SUCTIONS) IMPLANT
GLOVE BIOGEL PI IND STRL 7.0 (GLOVE) ×2 IMPLANT
GLOVE BIOGEL PI IND STRL 7.5 (GLOVE) ×2 IMPLANT
GLOVE BIOGEL PI INDICATOR 7.0 (GLOVE) ×2
GLOVE BIOGEL PI INDICATOR 7.5 (GLOVE) ×2
GLOVE ECLIPSE 7.5 STRL STRAW (GLOVE) ×2 IMPLANT
GOWN STRL REUS W/TWL LRG LVL3 (GOWN DISPOSABLE) ×6 IMPLANT
KIT ABG SYR 3ML LUER SLIP (SYRINGE) IMPLANT
NDL HYPO 25X5/8 SAFETYGLIDE (NEEDLE) IMPLANT
NEEDLE HYPO 25X5/8 SAFETYGLIDE (NEEDLE) IMPLANT
NS IRRIG 1000ML POUR BTL (IV SOLUTION) ×2 IMPLANT
PACK C SECTION WH (CUSTOM PROCEDURE TRAY) ×2 IMPLANT
PAD OB MATERNITY 4.3X12.25 (PERSONAL CARE ITEMS) ×2 IMPLANT
PENCIL SMOKE EVAC W/HOLSTER (ELECTROSURGICAL) ×2 IMPLANT
RTRCTR C-SECT PINK 25CM LRG (MISCELLANEOUS) ×2 IMPLANT
STRIP CLOSURE SKIN 1/2X4 (GAUZE/BANDAGES/DRESSINGS) ×2 IMPLANT
SUT PDS AB 0 CTX 36 PDP370T (SUTURE) ×1 IMPLANT
SUT PDS AB 0 CTX 60 (SUTURE) ×1 IMPLANT
SUT VIC AB 0 CTX 36 (SUTURE) ×3
SUT VIC AB 0 CTX36XBRD ANBCTRL (SUTURE) ×3 IMPLANT
SUT VIC AB 2-0 CT1 27 (SUTURE) ×1
SUT VIC AB 2-0 CT1 TAPERPNT 27 (SUTURE) ×1 IMPLANT
SUT VIC AB 4-0 KS 27 (SUTURE) ×2 IMPLANT
TOWEL OR 17X24 6PK STRL BLUE (TOWEL DISPOSABLE) ×2 IMPLANT
TRAY FOLEY W/BAG SLVR 14FR LF (SET/KITS/TRAYS/PACK) ×2 IMPLANT
WATER STERILE IRR 1000ML POUR (IV SOLUTION) ×2 IMPLANT

## 2021-12-10 NOTE — Anesthesia Postprocedure Evaluation (Signed)
Anesthesia Post Note  Patient: Marilyn Casey  Procedure(s) Performed: CESAREAN SECTION     Patient location during evaluation: PACU Anesthesia Type: Spinal Level of consciousness: oriented and awake and alert Pain management: pain level controlled Vital Signs Assessment: post-procedure vital signs reviewed and stable Respiratory status: spontaneous breathing and respiratory function stable Cardiovascular status: blood pressure returned to baseline and stable Postop Assessment: no headache, no backache, no apparent nausea or vomiting, spinal receding and patient able to bend at knees Anesthetic complications: no   No notable events documented.  Last Vitals:  Vitals:   12/10/21 1400 12/10/21 1415  BP: (!) 125/99   Pulse: 70 71  Resp: 20 10  Temp:    SpO2: 97% 96%    Last Pain:  Vitals:   12/10/21 1415  TempSrc:   PainSc: 0-No pain   Pain Goal:    LLE Motor Response: Purposeful movement (12/10/21 1415)   RLE Motor Response: Purposeful movement (12/10/21 1415)       Epidural/Spinal Function Cutaneous sensation: Tingles (12/10/21 1415), Patient able to flex knees: No (12/10/21 1415), Patient able to lift hips off bed: No (12/10/21 1415), Back pain beyond tenderness at insertion site: No (12/10/21 1415), Progressively worsening motor and/or sensory loss: No (12/10/21 1415), Bowel and/or bladder incontinence post epidural: No (12/10/21 1415)  Mellody Dance

## 2021-12-10 NOTE — H&P (Signed)
Faculty Practice H&P  Marilyn Casey is a 31 y.o. female [id number] with IUP at [redacted]w[redacted]d with pregnancy complicated by prior c/s, h/o preeclampsia, GDM who was sent by MFM due to severe range BP in office at [place]. Her BP there was 166/101. She was also seen in the office yesterday, had elevated BPs there. Blood work was done and resulted this morning, showing elevated LFts in the low 100s. MFM sent her here with the recommendation to proceed with delivery.  She has no complaints.   Pt states she has been having no contractions, no vaginal bleeding, intact membranes, with normal fetal movement.     Prenatal Course Source of Care: [facility] with onset of care at 16 weeks  Pregnancy complications or risks: Patient Active Problem List   Diagnosis Date Noted   Gestational diabetes 10/30/2021   Supervision of high risk pregnancy, antepartum 07/10/2021   History of C-section 07/10/2021   History of pre-eclampsia 07/10/2021   She desires Depo-Provera for contraception.  She plans to breastfeed  Prenatal labs and studies: ABO, Rh: O/Negative/-- (09/14 1655) Antibody: Negative (09/14 1655) Rubella: 2.77 (09/14 1655) RPR: Non Reactive (12/09 0844)  HBsAg: Negative (09/14 1655)  HIV: Non Reactive (12/09 0844)  GBS:    2hr Glucola: positive Genetic screening: normal Anatomy US: normal  Past Medical History:  Past Medical History:  Diagnosis Date   Medical history non-contributory    Preeclampsia     Past Surgical History:  Past Surgical History:  Procedure Laterality Date   CESAREAN SECTION     ECTOPIC PREGNANCY SURGERY      Obstetrical History:  OB History     Gravida  5   Para  2   Term  0   Preterm  2   AB  2   Living  2      SAB  1   IAB      Ectopic  1   Multiple      Live Births  2           Gynecological History:  OB History     Gravida  5   Para  2   Term  0   Preterm  2   AB  2   Living  2      SAB  1   IAB      Ectopic  1    Multiple      Live Births  2           Social History:  Social History   Socioeconomic History   Marital status: Single    Spouse name: Not on file   Number of children: Not on file   Years of education: Not on file   Highest education level: Not on file  Occupational History   Not on file  Tobacco Use   Smoking status: Former    Types: Cigarettes    Quit date: 06/10/2021    Years since quitting: 0.5   Smokeless tobacco: Never  Vaping Use   Vaping Use: Never used  Substance and Sexual Activity   Alcohol use: Not Currently   Drug use: Never   Sexual activity: Yes    Birth control/protection: None  Other Topics Concern   Not on file  Social History Narrative   Not on file   Social Determinants of Health   Financial Resource Strain: Not on file  Food Insecurity: No Food Insecurity   Worried About Running Out of Food in the  Last Year: Never true   Ran Out of Food in the Last Year: Never true  Transportation Needs: No Transportation Needs   Lack of Transportation (Medical): No   Lack of Transportation (Non-Medical): No  Physical Activity: Not on file  Stress: Not on file  Social Connections: Not on file    Family History:  Family History  Problem Relation Age of Onset   Diabetes Mother     Medications:  Prenatal vitamins,  Current Facility-Administered Medications  Medication Dose Route Frequency Provider Last Rate Last Admin   lactated ringers infusion   Intravenous Continuous Levie Heritage, DO       magnesium bolus via infusion 4 g  4 g Intravenous Once Fabio Wah J, DO       magnesium sulfate 40 grams in SWI 1000 mL OB infusion  2 g/hr Intravenous Titrated Levie Heritage, DO        Allergies: No Known Allergies  Review of Systems: -negative  Physical Exam: Blood pressure (!) 138/101, pulse 84, temperature 98.4 F (36.9 C), temperature source Oral, resp. rate 17, height 5\' 4"  (1.626 m), weight 88.3 kg. GENERAL: Well-developed,  well-nourished female in no acute distress.  LUNGS: Clear to auscultation bilaterally.  HEART: Regular rate and rhythm. ABDOMEN: Soft, nontender, nondistended, gravid. EFW 6.5 lbs EXTREMITIES: Nontender, no edema, 2+ distal pulses. FHT:  Baseline rate 136 bpm   Variability moderate  Accelerations present   Decelerations none Pertinent Labs/Studies:   Lab Results  Component Value Date   WBC 5.8 12/09/2021   HGB 10.1 (L) 12/09/2021   HCT 30.9 (L) 12/09/2021   MCV 84 12/09/2021   PLT 445 12/09/2021    Assessment : Marilyn Casey is a 31 y.o. 34 at [redacted]w[redacted]d being admitted for cesarean section secondary to severe preeclampsia  Plan: The risks of cesarean section discussed with the patient included but were not limited to: bleeding which may require transfusion or reoperation; infection which may require antibiotics; injury to bowel, bladder, ureters or other surrounding organs; injury to the fetus; need for additional procedures including hysterectomy in the event of a life-threatening hemorrhage; placental abnormalities wth subsequent pregnancies, incisional problems, thromboembolic phenomenon and other postoperative/anesthesia complications. The patient concurred with the proposed plan, giving informed written consent for the procedure.   Patient has been NPO since last night and will remain NPO for procedure.  Preoperative prophylactic Ancef ordered on call to the OR.    [redacted]w[redacted]d, DO 12/10/2021, 9:55 AM

## 2021-12-10 NOTE — MAU Note (Signed)
Sent over for further eval for possible c/s.  BP elevated.  LFTs elevated on blood work yesterday.  Pt is scheduled for repeat c/s.  Denies HA, visual changes, or epigastric pain, reports increased swelling in hands and feet. Reports +FM

## 2021-12-10 NOTE — Anesthesia Preprocedure Evaluation (Addendum)
Anesthesia Evaluation  Patient identified by MRN, date of birth, ID band Patient awake    Reviewed: Allergy & Precautions, NPO status , Patient's Chart, lab work & pertinent test results  Airway Mallampati: II  TM Distance: >3 FB Neck ROM: Full    Dental no notable dental hx.    Pulmonary ResolvedRecent URI: covid 11/21/21., former smoker,    Pulmonary exam normal breath sounds clear to auscultation       Cardiovascular hypertension (preeclampsia), negative cardio ROS Normal cardiovascular exam Rhythm:Regular Rate:Normal     Neuro/Psych negative neurological ROS  negative psych ROS   GI/Hepatic Neg liver ROS, GERD  Poorly Controlled,  Endo/Other  negative endocrine ROSdiabetes, Gestational  Renal/GU negative Renal ROS  negative genitourinary   Musculoskeletal negative musculoskeletal ROS (+)   Abdominal   Peds negative pediatric ROS (+)  Hematology  (+) anemia ,   Anesthesia Other Findings   Reproductive/Obstetrics (+) Pregnancy                           Anesthesia Physical Anesthesia Plan  ASA: 3  Anesthesia Plan: Spinal   Post-op Pain Management:    Induction: Intravenous  PONV Risk Score and Plan: 2 and Treatment may vary due to age or medical condition  Airway Management Planned: Natural Airway  Additional Equipment:   Intra-op Plan:   Post-operative Plan:   Informed Consent: I have reviewed the patients History and Physical, chart, labs and discussed the procedure including the risks, benefits and alternatives for the proposed anesthesia with the patient or authorized representative who has indicated his/her understanding and acceptance.     Dental advisory given  Plan Discussed with: CRNA and Anesthesiologist  Anesthesia Plan Comments: (Spinal. GETA as backup plan. Tanna Furry, MD   Patient is appropriately NPO. Dr. Adrian Blackwater would like to proceed without waiting on  type and screen. Tanna Furry, MD 12/10/21 @ 1115  )      Anesthesia Quick Evaluation

## 2021-12-10 NOTE — Transfer of Care (Signed)
Immediate Anesthesia Transfer of Care Note  Patient: Marilyn Casey  Procedure(s) Performed: CESAREAN SECTION  Patient Location: PACU  Anesthesia Type:Spinal  Level of Consciousness: awake, alert  and oriented  Airway & Oxygen Therapy: Patient Spontanous Breathing  Post-op Assessment: Report given to RN and Post -op Vital signs reviewed and stable  Post vital signs: Reviewed and stable  Last Vitals:  Vitals Value Taken Time  BP 127/86 12/10/21 1343  Temp    Pulse 70 12/10/21 1345  Resp 17 12/10/21 1345  SpO2 98 % 12/10/21 1345  Vitals shown include unvalidated device data.  Last Pain:  Vitals:   12/10/21 1029  TempSrc: Oral         Complications: No notable events documented.

## 2021-12-10 NOTE — Progress Notes (Addendum)
MFM Brief Note  Marilyn Casey is a 31 yo G5P3 who is at 36w 3d who is here for follow up growth with known A1GDM. She is seen today at the request of  Earlie Server, NP  A single intrauterine pregnancy with measurements consistent with dates. There is good fetal movement and amniotic fluid. Positive interval growth is observed.   Marilyn Casey blood pressure was 166/92 and 158/95 mmHg.   She was seen yesterday with elevated blood pressure so 138/91 mHg.   She reports that she has had a mild headache for the last few days as well.  Labs were drawn yesterday demonstrating elevated LFT's AST 102 and ALT 135.   Therefore I recommended Marilyn Casey go to MAU for further evaluation for HELLP syndrome with plan for delivery.  I spent 30 minutes with > 50% in face to face consultation.  Vikki Ports, MD.   I discussed this case with Elspeth Cho,

## 2021-12-10 NOTE — Discharge Summary (Signed)
Postpartum Discharge Summary  Date of Service updated: 12/12/21     Patient Name: Marilyn Casey DOB: [birth year] MRN: [id number]  Date of admission: 12/10/2021 Delivery date:12/10/2021  Delivering provider: Truett Mainland  Date of discharge: 12/12/2021  Admitting diagnosis: Cesarean delivery delivered [O82] Intrauterine pregnancy: [redacted]w[redacted]d    Secondary diagnosis:  Principal Problem:   Severe preeclampsia, delivered Active Problems:   Supervision of high risk pregnancy, antepartum   History of C-section   History of pre-eclampsia   Gestational diabetes   Status post repeat low transverse cesarean section   Rh negative state in antepartum period   Postoperative anemia due to acute blood loss  Additional problems: n/a    Discharge diagnosis: Preterm Pregnancy Delivered                                              Post partum procedures: magnesium sulfate recovery Augmentation: N/A Complications: None  Hospital course: Sceduled C/S -  31y.o. yo [id number] admitted to the hospital 12/10/2021 for scheduled cesarean section with the following indication: Elective repeat and pre-eclampsia with severe featues. Delivery details are as follows:  Membrane Rupture Time/Date: 12:42 PM ,12/10/2021   Delivery Method:C-Section, Low Transverse  Details of operation can be found in separate operative note.  Patient had an uncomplicated postpartum course.  She is ambulating, tolerating a regular diet, passing flatus, and urinating well. Patient is discharged home in stable condition on  12/12/21.  LFT were elevated but as of 12/12/21, they are beginning to trend down.        Newborn Data: Birth date:12/10/2021  Birth time:12:42 PM  Gender:Female  Living status:Living  Apgars:8 ,9  Weight:2610 g     Magnesium Sulfate received: Yes: Seizure prophylaxis BMZ received: No Rhophylac: Rhogam eval postpartum , pt given rhogam MMR:N/A T-DaP: Offered postpartum  Flu: No Transfusion:  No  Physical exam  Vitals:   12/11/21 2326 12/12/21 0437 12/12/21 0756 12/12/21 0759  BP: (!) 150/85 115/61 (!) 154/92 135/88  Pulse: 71 77 90 79  Resp: '18 18  18  ' Temp: 97.9 F (36.6 C) 97.6 F (36.4 C)  98.3 F (36.8 C)  TempSrc: Oral Oral  Oral  SpO2: 98% 98%    Weight:      Height:       General: alert, cooperative, and no distress Lochia: appropriate Uterine Fundus: firm Incision: Dressing is clean, dry, and intact DVT Evaluation: No evidence of DVT seen on physical exam. Negative Homan's sign.  Labs: Lab Results  Component Value Date   WBC 15.6 (H) 12/11/2021   HGB 9.1 (L) 12/11/2021   HCT 28.0 (L) 12/11/2021   MCV 86.4 12/11/2021   PLT 395 12/11/2021   CMP Latest Ref Rng & Units 12/12/2021  Glucose 70 - 99 mg/dL 80  BUN 6 - 20 mg/dL 7  Creatinine 0.44 - 1.00 mg/dL 0.76  Sodium 135 - 145 mmol/L 137  Potassium 3.5 - 5.1 mmol/L 4.0  Chloride 98 - 111 mmol/L 105  CO2 22 - 32 mmol/L 25  Calcium 8.9 - 10.3 mg/dL 8.0(L)  Total Protein 6.5 - 8.1 g/dL 5.5(L)  Total Bilirubin 0.3 - 1.2 mg/dL 0.3  Alkaline Phos 38 - 126 U/L 133(H)  AST 15 - 41 U/L 78(H)  ALT 0 - 44 U/L 112(H)   EdFlavia Shippercore: Edinburgh Postnatal Depression  Scale Screening Tool 12/11/2021  I have been able to laugh and see the funny side of things. 0  I have looked forward with enjoyment to things. 0  I have blamed myself unnecessarily when things went wrong. 0  I have been anxious or worried for no good reason. 0  I have felt scared or panicky for no good reason. 0  Things have been getting on top of me. 0  I have been so unhappy that I have had difficulty sleeping. 0  I have felt sad or miserable. 0  I have been so unhappy that I have been crying. 0  The thought of harming myself has occurred to me. 0  Edinburgh Postnatal Depression Scale Total 0     After visit meds:  Allergies as of 12/12/2021   No Known Allergies      Medication List     STOP taking these medications    Accu-Chek  Guide test strip Generic drug: glucose blood   Accu-Chek Nano SmartView w/Device Kit   Accu-Chek Softclix Lancets lancets   Blood Pressure Kit Devi   famotidine 20 MG tablet Commonly known as: Pepcid   iron polysaccharides 150 MG capsule Commonly known as: NIFEREX Replaced by: FeroSul 325 (65 FE) MG tablet       TAKE these medications    docusate sodium 100 MG capsule Commonly known as: COLACE Take 1 capsule (100 mg total) by mouth 2 (two) times daily as needed.   FeroSul 325 (65 FE) MG tablet Generic drug: ferrous sulfate Take 1 tablet (325 mg total) by mouth every other day. Replaces: iron polysaccharides 150 MG capsule   furosemide 20 MG tablet Commonly known as: LASIX Take 1 tablet (20 mg total) by mouth daily.   ibuprofen 600 MG tablet Commonly known as: ADVIL Take 1 tablet (600 mg total) by mouth every 6 (six) hours.   NIFEdipine 30 MG 24 hr tablet Commonly known as: ADALAT CC Take 1 tablet (30 mg total) by mouth daily. Start taking on: December 13, 2021   oxyCODONE 5 MG immediate release tablet Commonly known as: Oxy IR/ROXICODONE Take 1-2 tablets (5-10 mg total) by mouth every 4 (four) hours as needed for up to 5 days for moderate pain.   prenatal multivitamin Tabs tablet Take 1 tablet by mouth daily at 12 noon.               Discharge Care Instructions  (From admission, onward)           Start     Ordered   12/12/21 0000  Discharge wound care:       Comments: Remove honeycomb dressing on 12/15/21   12/12/21 1033             Discharge home in stable condition Infant Feeding: Breast Infant Disposition:home with mother Discharge instruction: per After Visit Summary and Postpartum booklet. Activity: Advance as tolerated. Pelvic rest for 6 weeks.  Diet: routine diet Future Appointments: Future Appointments  Date Time Provider [place]  12/17/2021  8:50 AM [facility] [facility] NURSE [facility]  01/21/2022  8:15 AM Clarnce Flock,  MD [facility]  01/21/2022  8:50 AM [facility] [place]   Follow up Visit:  [place] for [facility]. Schedule an appointment as soon as possible for a visit in 1 week(s).   Specialty: Obstetrics and Gynecology Why: BP check and recheck CMP Contact information: [place]  [place] [phone] [place] for [facility]. Schedule an appointment as soon as possible for a visit in 5 week(s).   Specialty: Obstetrics and Gynecology Why: postpartum visit Contact information: [place] [phone]               Message sent to [facility] by Dr. Gwenlyn Perking on 12/10/21.  Please schedule this patient for a In person postpartum visit in 6 weeks with the following provider: MD. Additional Postpartum F/U: 2 hour GTT, Incision check 1 week, and BP check 1 week  High risk pregnancy complicated by:  Hx of CS x2, GDM, pre-eclampsia Delivery mode:  C-Section, Low Transverse  Anticipated Birth Control:  Depo  12/12/2021 Griffin Basil, MD

## 2021-12-10 NOTE — Lactation Note (Signed)
This note was copied from a baby's chart. Lactation Consultation Note  Patient Name: Marilyn Casey Today's Date: 12/10/2021 Reason for consult: Initial assessment;Mother's request;Late-preterm 34-36.6wks;Infant < 6lbs;Maternal endocrine disorder (PIH, Anemia) Age:33 hours LC assisted with latching infant at the breast with signs of milk transfer.  LC accessed flange size and set up DEBP.  LPTI guidelines reviewed to reduce calorie loss including keeping feeding under 30 min.   Plan 1.to feed based on cues 8-12x 24hr period. Mom to offer breasts and look for signs of milk transfer.  2. Mom to supplement with EBM via spoon or pace bottle feeding with slow flow nipple 5-10 ml per feeding. RN, Kandice Hams to assist with pumping and will provided slow flow nipples.  3. DEBP q 3hrs for 15 min  4. I and O sheet reviewed.  All questions answered at the end of the visit.  Maternal Data Has patient been taught Hand Expression?: Yes Does the patient have breastfeeding experience prior to this delivery?: Yes How long did the patient breastfeed?: 2 weeks infant did not latch for 2 previous children  Feeding Mother's Current Feeding Choice: Breast Milk  LATCH Score Latch: Repeated attempts needed to sustain latch, nipple held in mouth throughout feeding, stimulation needed to elicit sucking reflex.  Audible Swallowing: Spontaneous and intermittent  Type of Nipple: Everted at rest and after stimulation  Comfort (Breast/Nipple): Soft / non-tender  Hold (Positioning): Assistance needed to correctly position infant at breast and maintain latch.  LATCH Score: 8   Lactation Tools Discussed/Used Tools: Pump;Flanges Flange Size: 24 Breast pump type: Double-Electric Breast Pump Pump Education: Setup, frequency, and cleaning;Milk Storage Reason for Pumping: increase stimulation Pumping frequency: every 3 hrs for 15 min  Interventions Interventions: Breast feeding basics reviewed;Adjust  position;DEBP;Assisted with latch;Support pillows;Skin to skin;Position options;Education;Breast massage;Expressed milk;Hand express;LC Psychologist, educational;Infant Driven Feeding Algorithm education;Breast compression  Discharge Pump: Manual WIC Program: No  Consult Status Consult Status: Follow-up Date: 12/11/21 Follow-up type: In-patient    Akire Rennert  N[facility]olson-Springer 12/10/2021, 4:41 PM

## 2021-12-10 NOTE — Op Note (Addendum)
Marilyn Casey  PROCEDURE DATE: 12/10/2021  PREOPERATIVE DIAGNOSES: Intrauterine pregnancy at [redacted]w[redacted]d weeks gestation;  hx of cesarean section x2; severe pre-eclampsia with severe features  POSTOPERATIVE DIAGNOSES: The same  PROCEDURE: Repeat Low Transverse Cesarean Section  SURGEON:  Dr. Candelaria Celeste  ASSISTANT:  Dr. Evalina Field   ANESTHESIOLOGY TEAM: Anesthesiologist: Mellody Dance, MD CRNA: Cleda Clarks, CRNA  INDICATIONS: Marilyn Casey is a 31 y.o. [id number] at [redacted]w[redacted]d here for cesarean section secondary to the indications listed under preoperative diagnoses; please see preoperative note for further details.  The risks of cesarean section were discussed with the patient including but were not limited to: bleeding which may require transfusion or reoperation; infection which may require antibiotics; injury to bowel, bladder, ureters or other surrounding organs; injury to the fetus; need for additional procedures including hysterectomy in the event of a life-threatening hemorrhage; placental abnormalities wth subsequent pregnancies, incisional problems, thromboembolic phenomenon and other postoperative/anesthesia complications.   The patient concurred with the proposed plan, giving informed written consent for the procedure.    FINDINGS:  Viable female infant in cephalic presentation.  Apgars 8 and 9.  Meconium stained amniotic fluid.  Intact placenta, three vessel cord.  Normal uterus, fallopian tubes and ovaries bilaterally. Dense adhesions between the fascia and rectus muscles.   ANESTHESIA: Spinal  INTRAVENOUS FLUIDS: 600 ml   ESTIMATED BLOOD LOSS: 597 ml URINE OUTPUT:  400 ml SPECIMENS: Placenta sent to pathology COMPLICATIONS: None immediate  PROCEDURE IN DETAIL:  The patient preoperatively received intravenous antibiotics and had sequential compression devices applied to her lower extremities.  She was then taken to the operating room where spinal anesthesia was administered  and was found to be adequate. She was then placed in a dorsal supine position with a leftward tilt, and prepped and draped in a sterile manner.  A foley catheter was placed into her bladder and attached to constant gravity.    After an adequate timeout was performed, a Pfannenstiel skin incision was made with scalpel just above her preexisting scar and carried through to the underlying layer of fascia. The fascia was incised in the midline, and this incision was extended bilaterally using the Mayo scissors.  The fascia was densely adhered to the rectus. Kocher clamps were applied to the superior aspect of the fascial incision and the underlying rectus muscles were dissected off bluntly and sharply.  A similar process was carried out on the inferior aspect of the fascial incision. The rectus muscles were separated in the midline and the peritoneum was entered bluntly. The left sided rectus muscles were transected approximately 2 cm to allow for adequate exposure.  The Alexis self-retaining retractor was introduced into the abdominal cavity.  A bladder flap was made.   Attention was turned to the lower uterine segment where a low transverse hysterotomy was made with a scalpel and extended bilaterally bluntly.  The infant was successfully delivered, the cord was clamped and cut after one minute, and the infant was handed over to the awaiting neonatology team. Uterine massage was then administered, and the placenta delivered intact with a three-vessel cord. The uterus was then cleared of clots and debris.    The hysterotomy was closed with 0 Vicryl in a running locked fashion.  Figure-of-eight 0 Vicryl serosal stitches were placed to help with hemostasis.  The pelvis was cleared of all clot and debris. Hemostasis was confirmed on all surfaces.  The retractor was removed.    The fascia was then closed using PDS in  a running fashion.  The subcutaneous layer was irrigated and the skin was closed with a 4-0 Vicryl  subcuticular stitch. The patient tolerated the procedure well. Sponge, instrument and needle counts were correct x 3.  She was taken to the recovery room in stable condition.   Evalina Field, MD  OB Fellow   Faculty Practice

## 2021-12-10 NOTE — Anesthesia Procedure Notes (Signed)
Spinal  Patient location during procedure: OR Start time: 12/10/2021 12:05 PM End time: 12/10/2021 12:10 PM Reason for block: surgical anesthesia Staffing Performed: anesthesiologist  Anesthesiologist: Mellody Dance, MD Preanesthetic Checklist Completed: patient identified, IV checked, risks and benefits discussed, surgical consent, monitors and equipment checked, pre-op evaluation and timeout performed Spinal Block Patient position: sitting Prep: DuraPrep Patient monitoring: cardiac monitor, continuous pulse ox and blood pressure Approach: midline Location: L3-4 Injection technique: single-shot Needle Needle type: Pencan  Needle gauge: 24 G Needle length: 9 cm Assessment Sensory level: T3 Events: CSF return Additional Notes Functioning IV was confirmed and monitors were applied. Sterile prep and drape, including hand hygiene and sterile gloves were used. The patient was positioned and the spine was prepped. The skin was anesthetized with lidocaine.  Free flow of clear CSF was obtained prior to injecting local anesthetic into the CSF.  The spinal needle aspirated freely following injection.  The needle was carefully withdrawn.  The patient tolerated the procedure well.

## 2021-12-11 DIAGNOSIS — D62 Acute posthemorrhagic anemia: Secondary | ICD-10-CM | POA: Diagnosis not present

## 2021-12-11 LAB — CBC
HCT: 28 % — ABNORMAL LOW (ref 36.0–46.0)
Hemoglobin: 9.1 g/dL — ABNORMAL LOW (ref 12.0–15.0)
MCH: 28.1 pg (ref 26.0–34.0)
MCHC: 32.5 g/dL (ref 30.0–36.0)
MCV: 86.4 fL (ref 80.0–100.0)
Platelets: 395 10*3/uL (ref 150–400)
RBC: 3.24 MIL/uL — ABNORMAL LOW (ref 3.87–5.11)
RDW: 13.5 % (ref 11.5–15.5)
WBC: 15.6 10*3/uL — ABNORMAL HIGH (ref 4.0–10.5)
nRBC: 0 % (ref 0.0–0.2)

## 2021-12-11 LAB — COMPREHENSIVE METABOLIC PANEL
ALT: 155 U/L — ABNORMAL HIGH (ref 0–44)
AST: 159 U/L — ABNORMAL HIGH (ref 15–41)
Albumin: 2.3 g/dL — ABNORMAL LOW (ref 3.5–5.0)
Alkaline Phosphatase: 161 U/L — ABNORMAL HIGH (ref 38–126)
Anion gap: 8 (ref 5–15)
BUN: 8 mg/dL (ref 6–20)
CO2: 24 mmol/L (ref 22–32)
Calcium: 7.3 mg/dL — ABNORMAL LOW (ref 8.9–10.3)
Chloride: 101 mmol/L (ref 98–111)
Creatinine, Ser: 0.83 mg/dL (ref 0.44–1.00)
GFR, Estimated: >60 mL/min (ref >60)
Glucose, Bld: 108 mg/dL — ABNORMAL HIGH (ref 70–99)
Potassium: 3.9 mmol/L (ref 3.5–5.1)
Sodium: 133 mmol/L — ABNORMAL LOW (ref 135–145)
Total Bilirubin: 0.5 mg/dL (ref 0.3–1.2)
Total Protein: 5.5 g/dL — ABNORMAL LOW (ref 6.5–8.1)

## 2021-12-11 LAB — GC/CHLAMYDIA PROBE AMP (~~LOC~~) NOT AT ARMC
Chlamydia: NEGATIVE
Comment: NEGATIVE
Comment: NORMAL
Neisseria Gonorrhea: NEGATIVE

## 2021-12-11 LAB — RPR: RPR Ser Ql: NONREACTIVE

## 2021-12-11 LAB — GLUCOSE, CAPILLARY: Glucose-Capillary: 126 mg/dL — ABNORMAL HIGH (ref 70–99)

## 2021-12-11 MED ORDER — NIFEDIPINE ER OSMOTIC RELEASE 30 MG PO TB24
30.0000 mg | ORAL_TABLET | Freq: Every day | ORAL | Status: DC
  Administered 2021-12-11 – 2021-12-12 (×2): 30 mg via ORAL
  Filled 2021-12-11 (×2): qty 1

## 2021-12-11 MED ORDER — FUROSEMIDE 40 MG PO TABS
20.0000 mg | ORAL_TABLET | Freq: Two times a day (BID) | ORAL | Status: DC
  Administered 2021-12-11 – 2021-12-12 (×3): 20 mg via ORAL
  Filled 2021-12-11 (×3): qty 1

## 2021-12-11 MED ORDER — SODIUM CHLORIDE 0.9 % IV SOLN
500.0000 mg | Freq: Once | INTRAVENOUS | Status: AC
  Administered 2021-12-11: 500 mg via INTRAVENOUS
  Filled 2021-12-11 (×2): qty 25

## 2021-12-11 MED ORDER — RHO D IMMUNE GLOBULIN 1500 UNIT/2ML IJ SOSY
300.0000 ug | PREFILLED_SYRINGE | Freq: Once | INTRAMUSCULAR | Status: AC
  Administered 2021-12-11: 300 ug via INTRAVENOUS
  Filled 2021-12-11: qty 2

## 2021-12-11 NOTE — Progress Notes (Addendum)
Postpartum Day 1: Cesarean Delivery at [redacted]w[redacted]d for severe preeclampsia Subjective: Patient reports incisional pain and tolerating PO.  Reports flatus. Patient denies any headaches, visual symptoms, RUQ/epigastric pain or other concerning symptoms.  Objective: Vital signs in last 24 hours: Temp:  [97.5 F (36.4 C)-98.4 F (36.9 C)] 97.5 F (36.4 C) (02/01 0447) Pulse Rate:  [62-88] 73 (02/01 0447) Resp:  [10-24] 16 (02/01 0447) BP: (120-166)/(78-101) 127/80 (02/01 0447) SpO2:  [90 %-100 %] 98 % (02/01 0447) Weight:  [88.3 kg] 88.3 kg (01/31 0923)  Intake/Output Summary (Last 24 hours) at 12/11/2021 0700 Last data filed at 12/11/2021 0500 Gross per 24 hour  Intake 3695.48 ml  Output 4247 ml  Net -551.52 ml   Physical Exam:  General: alert and no distress Lochia: appropriate Uterine Fundus: firm Incision: no significant drainage DVT Evaluation: No evidence of DVT seen on physical exam. Negative Homan's sign. No cords or calf tenderness.  Recent Labs    12/10/21 1007 12/11/21 0443  HGB 10.5* 9.1*  HCT 31.7* 28.0*   CBG (last 3)  Recent Labs    12/10/21 1351 12/11/21 0603  GLUCAP 77 126*    Assessment/Plan: Status post Cesarean section. Doing well postoperatively.  Discussed management of acute blood loss anemia, Venofer recommended. She agreed, and this was ordered to occur later today. Magnesium sulfate infusion done around 1300, already ordered to start Procardia XL 30 daily and Lasix 20 mg bid. Monitor BP. Continue to monitor fasting CBG, the one from this morning was not truly a fasting value. She has diet controlled GDM Awaiting Rhogam evaluation Depo Provera desired for contraception Patient desires circumcision for her female infant.  Circumcision procedure details discussed, risks and benefits of procedure were also discussed.  These include but are not limited to: Benefits of circumcision in men include reduction in the rates of urinary tract infection (UTI), penile  cancer, some sexually transmitted infections, penile inflammatory and retractile disorders, as well as easier hygiene.  Risks include bleeding , infection, injury of glans which may lead to penile deformity or urinary tract issues, unsatisfactory cosmetic appearance and other potential complications related to the procedure.  It was emphasized that this is an elective procedure.  Written informed consent obtained.  Circumcision will be performed prior to infant's discharge to home, after this is signed off on by Pediatrician. Continue routine postpartum and postoperative care.   Jaynie Collins, MD 12/11/2021, 7:00 AM

## 2021-12-11 NOTE — Lactation Note (Signed)
This note was copied from a baby's chart. Lactation Consultation Note  Patient Name: Marilyn Casey Today's Date: 12/11/2021 Reason for consult: Follow-up assessment;Late-preterm 34-36.6wks;Infant < 6lbs;Maternal endocrine disorder Age:33 hours  P3, GDM, PIH, Mother is offering breast and supplementing with formula. Mother concerned about milk volume.  Provided education. Reviewed LPI volumes and limiting feedings to 30 min. Provided LPI information sheet.   Faxed WIC pump referral.     Maternal Data Has patient been taught Hand Expression?: Yes  Feeding Mother's Current Feeding Choice: Breast Milk and Formula Nipple Type: Slow - flow   Lactation Tools Discussed/Used Tools: Pump Flange Size: 24 Breast pump type: Double-Electric Breast Pump Reason for Pumping: stimulation and supplementation Pumping frequency:  (Recommend q 3 hours)  Interventions Interventions: Education;DEBP  Discharge Pump:  (Request to certify and  request for WIC to provide mother with DEBP) WIC Program: No  Consult Status Consult Status: Follow-up Date: 12/12/21 Follow-up type: In-patient    Dahlia Byes [facility] 12/11/2021, 8:56 AM

## 2021-12-11 NOTE — Progress Notes (Signed)
Patient called out to desk about 15 min after IV iron started c/o pain at the site. IV site looked fine, slowed infusion down. Patient called out again about an hour later to say that site had become even more painful. Upon inspection site was noted to be swollen. Catheter was intact and no leaking. Spoke with physician on call and orders given to d/c IV and will resume iron PO. Patient prefers this route as well.

## 2021-12-12 ENCOUNTER — Other Ambulatory Visit (HOSPITAL_COMMUNITY): Payer: Self-pay

## 2021-12-12 LAB — COMPREHENSIVE METABOLIC PANEL
ALT: 112 U/L — ABNORMAL HIGH (ref 0–44)
AST: 78 U/L — ABNORMAL HIGH (ref 15–41)
Albumin: 2.2 g/dL — ABNORMAL LOW (ref 3.5–5.0)
Alkaline Phosphatase: 133 U/L — ABNORMAL HIGH (ref 38–126)
Anion gap: 7 (ref 5–15)
BUN: 7 mg/dL (ref 6–20)
CO2: 25 mmol/L (ref 22–32)
Calcium: 8 mg/dL — ABNORMAL LOW (ref 8.9–10.3)
Chloride: 105 mmol/L (ref 98–111)
Creatinine, Ser: 0.76 mg/dL (ref 0.44–1.00)
GFR, Estimated: >60 mL/min (ref >60)
Glucose, Bld: 80 mg/dL (ref 70–99)
Potassium: 4 mmol/L (ref 3.5–5.1)
Sodium: 137 mmol/L (ref 135–145)
Total Bilirubin: 0.3 mg/dL (ref 0.3–1.2)
Total Protein: 5.5 g/dL — ABNORMAL LOW (ref 6.5–8.1)

## 2021-12-12 LAB — RH IG WORKUP (INCLUDES ABO/RH)
Fetal Screen: NEGATIVE
Gestational Age(Wks): 36
Unit division: 0

## 2021-12-12 LAB — SURGICAL PATHOLOGY

## 2021-12-12 MED ORDER — OXYCODONE HCL 5 MG PO TABS
5.0000 mg | ORAL_TABLET | ORAL | 0 refills | Status: AC | PRN
  Filled 2021-12-12: qty 28, 3d supply, fill #0

## 2021-12-12 MED ORDER — DOCUSATE SODIUM 100 MG PO CAPS
100.0000 mg | ORAL_CAPSULE | Freq: Two times a day (BID) | ORAL | 2 refills | Status: DC | PRN
  Filled 2021-12-12: qty 30, 15d supply, fill #0
  Filled 2021-12-30: qty 30, 15d supply, fill #1

## 2021-12-12 MED ORDER — FERROUS SULFATE 325 (65 FE) MG PO TABS
325.0000 mg | ORAL_TABLET | ORAL | 3 refills | Status: DC
  Filled 2021-12-12: qty 15, 30d supply, fill #0
  Filled 2021-12-12: qty 30, 60d supply, fill #0
  Filled 2021-12-30: qty 15, 30d supply, fill #1

## 2021-12-12 MED ORDER — NIFEDIPINE ER 30 MG PO TB24
30.0000 mg | ORAL_TABLET | Freq: Every day | ORAL | 1 refills | Status: DC
  Filled 2021-12-12: qty 30, 30d supply, fill #0
  Filled 2021-12-30: qty 30, 30d supply, fill #1

## 2021-12-12 MED ORDER — FUROSEMIDE 20 MG PO TABS
20.0000 mg | ORAL_TABLET | Freq: Every day | ORAL | 0 refills | Status: DC
  Filled 2021-12-12: qty 3, 3d supply, fill #0

## 2021-12-12 MED ORDER — IBUPROFEN 600 MG PO TABS
600.0000 mg | ORAL_TABLET | Freq: Four times a day (QID) | ORAL | 1 refills | Status: DC
  Filled 2021-12-12: qty 30, 8d supply, fill #0
  Filled 2021-12-30: qty 30, 8d supply, fill #1

## 2021-12-12 NOTE — Progress Notes (Signed)
POSTPARTUM PROGRESS NOTE  POD #2  Subjective:  Marilyn Casey is a 31 y.o. [id number] s/p repeat LTCS at [redacted]w[redacted]d. No acute events overnight. She reports she is doing well. She denies any problems with ambulating, voiding or po intake. Denies nausea or vomiting. She has  passed flatus. Pain is well controlled.  Lochia is normal.  Objective: Blood pressure 135/88, pulse 79, temperature 98.3 F (36.8 C), temperature source Oral, resp. rate 18, height 5\' 4"  (1.626 m), weight 88.3 kg, SpO2 98 %, unknown if currently breastfeeding.  Physical Exam:  General: alert, cooperative and no distress Chest: no respiratory distress Heart:regular rate, distal pulses intact Abdomen: soft, nontender,  Uterine Fundus: firm, appropriately tender DVT Evaluation: No calf swelling or tenderness Extremities: trace edema Skin: warm, dry; incision clean/dry/intact w/ honeycomb dressing in place  Recent Labs    12/10/21 1007 12/11/21 0443  HGB 10.5* 9.1*  HCT 31.7* 28.0*    Assessment/Plan: Marilyn Casey is a 31 y.o. 34 s/p repeat LTCS at [redacted]w[redacted]d for severe preeclampsia and hx of previous c section.  POD#2 Pt is s/p magnesium sulfate x 2 4 hours -  Contraception: depo provera Feeding: breast  Dispo: Plan for discharge after completion of circumcision.   LOS: 2 days   [redacted]w[redacted]d, Md Faculty Attending, Center for [facility] 12/12/2021, 9:57 AM

## 2021-12-12 NOTE — Lactation Note (Addendum)
This note was copied from a baby's chart. Lactation Consultation Note  Patient Name: Boy Carolynn Tango Today's Date: 12/12/2021 Reason for consult: Follow-up assessment;Mother's request;Late-preterm 34-36.6wks (PIH, Anemia Iron transfuision) Age:33 hours  Infant went for Circ. LC reviewed feeding with Mother, encouraged her to do breast compression to increase time at the breast by offering more volume. We also reviewed LPTI guidelines to reduce calorie loss including keeping total feeding under 30 min.  Infant adequate urine and stool output.   Infant returned from circ. Mom declined latch assistance and will give a bottle.  Coconut oil provided for nipple care. Mom aware to adjust flange sizes as needed.    Plan 1. To feed based on cues 8-12x 24hr period. Mom to offer breasts with compression and look for signs of milk transfer.  2. Mom to supplement with EBM first followed by formula with pace bottle feeding with yellow slow flow nipple. LPTI supplementation guide reviewed, parents aware to offer more volume if infant not latching at breast.  3. Mom to get [place] pump in discharge, RN, Ophelia Charter submitted an order.  DEBP q 3 hrs for 15 min  All questions answered at the end of the visit.   Maternal Data    Feeding Mother's Current Feeding Choice: Breast Milk and Formula  LATCH Score                    Lactation Tools Discussed/Used Tools: Flanges;Coconut oil;Pump Flange Size: 24 Breast pump type: Double-Electric Breast Pump Pump Education: Setup, frequency, and cleaning;Milk Storage Reason for Pumping: increase stimulation Pumping frequency: every 3 hrs for 15 min  Interventions Interventions: Breast feeding basics reviewed;DEBP;Position options;Education;Pace feeding;Expressed milk;Hand express;LC Services brochure;Infant Driven Feeding Algorithm education;Breast compression;Coconut oil  Discharge Discharge Education: Engorgement and breast care;Warning signs  for feeding baby;Outpatient recommendation  Consult Status Consult Status: Follow-up Date: 12/12/21 Follow-up type: In-patient    Deniyah Dillavou  N[facility]olson-Springer 12/12/2021, 12:14 PM

## 2021-12-13 LAB — CULTURE, BETA STREP (GROUP B ONLY): Strep Gp B Culture: NEGATIVE

## 2021-12-16 ENCOUNTER — Encounter: Payer: Medicaid Other | Admitting: Obstetrics and Gynecology

## 2021-12-17 ENCOUNTER — Ambulatory Visit (INDEPENDENT_AMBULATORY_CARE_PROVIDER_SITE_OTHER): Payer: Medicaid Other | Admitting: General Practice

## 2021-12-17 ENCOUNTER — Other Ambulatory Visit: Payer: Self-pay

## 2021-12-17 VITALS — BP 128/85 | HR 88 | Ht 64.0 in | Wt 177.0 lb

## 2021-12-17 DIAGNOSIS — Z5189 Encounter for other specified aftercare: Secondary | ICD-10-CM

## 2021-12-17 DIAGNOSIS — Z0130 Encounter for examination of blood pressure without abnormal findings: Secondary | ICD-10-CM

## 2021-12-17 NOTE — Progress Notes (Signed)
Patient presents to office today for wound & blood pressure check following repeat c-section on 1/31. Patient was diagnosed with pre-e during hospital admission. Reports headache the other day that was relieved with Ibuprofen- denies dizziness or blurry vision. BP 128/85 today- she is taking nifedipine daily. Incision is clean, dry & intact- appears to be healing well. Reviewed wound care and signs & symptoms of infection with patient. Patient will follow up on 3/14 for postpartum care.   Chase Caller RN BSN 12/17/21

## 2021-12-28 ENCOUNTER — Encounter (HOSPITAL_COMMUNITY): Payer: Self-pay

## 2021-12-28 ENCOUNTER — Inpatient Hospital Stay (HOSPITAL_COMMUNITY): Admit: 2021-12-28 | Payer: Medicaid Other | Admitting: Obstetrics & Gynecology

## 2021-12-28 SURGERY — Surgical Case
Anesthesia: Regional

## 2021-12-31 ENCOUNTER — Other Ambulatory Visit (HOSPITAL_COMMUNITY): Payer: Self-pay

## 2022-01-01 ENCOUNTER — Encounter: Payer: Self-pay | Admitting: Obstetrics & Gynecology

## 2022-01-01 ENCOUNTER — Other Ambulatory Visit (HOSPITAL_COMMUNITY): Payer: Self-pay

## 2022-01-01 ENCOUNTER — Encounter: Payer: Self-pay | Admitting: Lactation Services

## 2022-01-02 ENCOUNTER — Other Ambulatory Visit (HOSPITAL_COMMUNITY): Payer: Self-pay

## 2022-01-03 ENCOUNTER — Other Ambulatory Visit (HOSPITAL_COMMUNITY): Payer: Self-pay

## 2022-01-07 ENCOUNTER — Other Ambulatory Visit (HOSPITAL_COMMUNITY): Payer: Self-pay

## 2022-01-08 ENCOUNTER — Other Ambulatory Visit (HOSPITAL_COMMUNITY): Payer: Self-pay

## 2022-01-10 ENCOUNTER — Other Ambulatory Visit (HOSPITAL_COMMUNITY): Payer: Self-pay

## 2022-01-13 ENCOUNTER — Encounter: Payer: Self-pay | Admitting: Maternal & Fetal Medicine

## 2022-01-13 ENCOUNTER — Encounter: Payer: Self-pay | Admitting: Obstetrics and Gynecology

## 2022-01-14 ENCOUNTER — Encounter: Payer: Self-pay | Admitting: Lactation Services

## 2022-01-15 ENCOUNTER — Other Ambulatory Visit (HOSPITAL_COMMUNITY): Payer: Self-pay

## 2022-01-20 ENCOUNTER — Other Ambulatory Visit (HOSPITAL_COMMUNITY): Payer: Self-pay

## 2022-01-20 NOTE — Progress Notes (Unsigned)
° ° °  Post Partum Visit Note  Marilyn Casey is a 31 y.o. [id number] female who presents for a postpartum visit. She is 6 weeks postpartum following a repeat cesarean section.  I have fully reviewed the prenatal and intrapartum course. The delivery was at 36.3 gestational weeks.  Anesthesia: spinal. Postpartum course has been ***. Baby is doing well***. Baby is feeding by breast. Bleeding {vag bleed:12292}. Bowel function is {normal:32111}. Bladder function is {normal:32111}. Patient {is/is not:9024} sexually active. Contraception method is Depo-Provera injections. Postpartum depression screening: {gen negative/positive:315881}.   The pregnancy intention screening data noted above was reviewed. Potential methods of contraception were discussed. The patient elected to proceed with No data recorded.    Health Maintenance Due  Topic Date Due   URINE MICROALBUMIN  Never done    {Common ambulatory SmartLinks:19316}  Review of Systems {ros; complete:30496}  Objective:  LMP  (LMP Unknown) Comment: Had miscarriage at end of May, then had  intercourse Mid June and home pregnancy test at end of June 2022   General:  {gen appearance:16600}   Breasts:  {desc; normal/abnormal/not indicated:14647}  Lungs: {lung exam:16931}  Heart:  {heart exam:5510}  Abdomen: {abdomen exam:16834}   Wound {Wound assessment:11097}  GU exam:  {desc; normal/abnormal/not indicated:14647}       Assessment:    There are no diagnoses linked to this encounter.  *** postpartum exam.   Plan:   Essential components of care per ACOG recommendations:  1.  Mood and well being: Patient with {gen negative/positive:315881} depression screening today. Reviewed local resources for support.  - Patient tobacco use? {tobacco use:25506}  - hx of drug use? {yes/no:25505}    2. Infant care and feeding:  -Patient currently breastmilk feeding? {yes/no:25502}  -Social determinants of health (SDOH) reviewed in EPIC. No concerns***The  following needs were identified***  3. Sexuality, contraception and birth spacing - Patient {DOES_DOES SJG:28366} want a pregnancy in the next year.  Desired family size is {NUMBER 1-10:22536} children.  - Reviewed reproductive life planning. Reviewed contraceptive methods based on pt preferences and effectiveness.  Patient desired {PLAN CONTRACEPTION:313102} today.   - Discussed birth spacing of 18 months  4. Sleep and fatigue -Encouraged family/partner/community support of 4 hrs of uninterrupted sleep to help with mood and fatigue  5. Physical Recovery  - Discussed patients delivery and complications. She describes her labor as {description:25511} - Patient had a {CHL AMB DELIVERY:3144337156}. Patient had a {laceration:25518} laceration. Perineal healing reviewed. Patient expressed understanding - Patient has urinary incontinence? {yes/no:25515} - Patient {ACTION; IS/IS QHU:76546503} safe to resume physical and sexual activity  6.  Health Maintenance - HM due items addressed {Yes or If no, why not?:20788} - Last pap smear  Diagnosis  Date Value Ref Range Status  07/24/2021   Final   - Negative for intraepithelial lesion or malignancy (NILM)   Pap smear {done:10129} at today's visit.  -Breast Cancer screening indicated? {indicated:25516}  7. Chronic Disease/Pregnancy Condition follow up: {Follow up:25499}  - PCP follow up  Isabell Jarvis, RN [facility] [employer]

## 2022-01-21 ENCOUNTER — Other Ambulatory Visit: Payer: Self-pay

## 2022-01-21 ENCOUNTER — Other Ambulatory Visit: Payer: Self-pay | Admitting: General Practice

## 2022-01-21 ENCOUNTER — Ambulatory Visit: Payer: Medicaid Other | Admitting: Medical

## 2022-01-21 ENCOUNTER — Encounter: Payer: Self-pay | Admitting: Family Medicine

## 2022-01-21 ENCOUNTER — Ambulatory Visit (INDEPENDENT_AMBULATORY_CARE_PROVIDER_SITE_OTHER): Payer: Medicaid Other | Admitting: Family Medicine

## 2022-01-21 ENCOUNTER — Other Ambulatory Visit: Payer: Medicaid Other

## 2022-01-21 DIAGNOSIS — O1414 Severe pre-eclampsia complicating childbirth: Secondary | ICD-10-CM

## 2022-01-21 DIAGNOSIS — O26899 Other specified pregnancy related conditions, unspecified trimester: Secondary | ICD-10-CM | POA: Diagnosis not present

## 2022-01-21 DIAGNOSIS — O24410 Gestational diabetes mellitus in pregnancy, diet controlled: Secondary | ICD-10-CM | POA: Diagnosis not present

## 2022-01-21 DIAGNOSIS — Z6791 Unspecified blood type, Rh negative: Secondary | ICD-10-CM

## 2022-01-21 DIAGNOSIS — Z8632 Personal history of gestational diabetes: Secondary | ICD-10-CM

## 2022-01-24 ENCOUNTER — Other Ambulatory Visit: Payer: Medicaid Other

## 2022-01-24 ENCOUNTER — Ambulatory Visit (INDEPENDENT_AMBULATORY_CARE_PROVIDER_SITE_OTHER): Payer: Medicaid Other

## 2022-01-24 ENCOUNTER — Other Ambulatory Visit: Payer: Self-pay

## 2022-01-24 VITALS — BP 114/81 | HR 108

## 2022-01-24 DIAGNOSIS — Z8632 Personal history of gestational diabetes: Secondary | ICD-10-CM

## 2022-01-24 DIAGNOSIS — Z0130 Encounter for examination of blood pressure without abnormal findings: Secondary | ICD-10-CM

## 2022-01-24 NOTE — Progress Notes (Signed)
Pt here today for BP check after discontinuing Nifedipine 30 po daily.  BP 114/81.  Pt denies headache and visual disturbances.  Pt advised to continue to monitor for symptoms of elevated BP.  Referral placed for PCP.  Pt encouraged to call the office with GYN concerns.  Pt verbalized understanding with no further questions.  ? ?Konrad Hoak,RN  ?01/24/22 ?

## 2022-01-25 LAB — GLUCOSE TOLERANCE, 2 HOURS
Glucose, 2 hour: 77 mg/dL (ref 70–139)
Glucose, GTT - Fasting: 90 mg/dL (ref 70–99)

## 2022-01-28 ENCOUNTER — Ambulatory Visit: Payer: Medicaid Other

## 2022-06-24 ENCOUNTER — Inpatient Hospital Stay (HOSPITAL_COMMUNITY)
Admission: AD | Admit: 2022-06-24 | Discharge: 2022-06-24 | Disposition: A | Discharge status: Home / Self Care | Payer: Medicaid Other | Attending: Obstetrics and Gynecology | Admitting: Obstetrics and Gynecology

## 2022-06-24 ENCOUNTER — Encounter (HOSPITAL_COMMUNITY): Payer: Self-pay | Admitting: Obstetrics and Gynecology

## 2022-06-24 DIAGNOSIS — N939 Abnormal uterine and vaginal bleeding, unspecified: Secondary | ICD-10-CM | POA: Insufficient documentation

## 2022-06-24 DIAGNOSIS — Z3202 Encounter for pregnancy test, result negative: Secondary | ICD-10-CM

## 2022-06-24 LAB — POCT PREGNANCY, URINE: Preg Test, Ur: NEGATIVE

## 2022-06-24 NOTE — MAU Note (Signed)
.  Marilyn Casey is a 31 y.o. at Unknown here in MAU reporting spotting for 3-4 days. Had Depo in Feb and has not had another one since. Did upt and was negative. Blood type negative and thinks she may need Rhogam. Mild pain in lower abd since spotting started. Some nausea LMP: 03/15/22 Onset of complaint: 2-3 days Pain score: 3 Vitals:   06/24/22 1931 06/24/22 1933  BP:  116/77  Pulse: 73   Resp: 17   Temp: 98.5 F (36.9 C)   SpO2: 98%      FHT:n/a Lab orders placed from triage:  upt

## 2022-06-24 NOTE — MAU Provider Note (Signed)
Event Date/Time   First Provider Initiated Contact with Patient 06/24/22 1948      S Ms. Marilyn Casey is a 31 y.o. [id number] patient who presents to MAU today with complaint of vaginal spotting. Reports pink vaginal spotting  x 3 days. Concerned she is pregnant. Has negative test at home. Last intercourse was 1 month ago. Had depo provera injection after her delivery in January but none since & is currently not using contraception.   O BP 116/77   Pulse 73   Temp 98.5 F (36.9 C)   Resp 17   Ht 5\' 4"  (1.626 m)   Wt 82.1 kg   LMP 03/15/2022   SpO2 98%   BMI 31.07 kg/m  Physical Exam Vitals and nursing note reviewed.  Constitutional:      General: She is not in acute distress.    Appearance: Normal appearance.  Eyes:     General: No scleral icterus.    Pupils: Pupils are equal, round, and reactive to light.  Pulmonary:     Effort: Pulmonary effort is normal. No respiratory distress.  Neurological:     Mental Status: She is alert.  Psychiatric:        Mood and Affect: Mood normal.        Behavior: Behavior normal.     A Medical screening exam complete 1. Negative pregnancy test   2. Vaginal spotting      P Discharge from MAU in stable condition Patient undecided regarding contraception - encouraged to f/u with OB for contraception If spotting continues may consider following up with OB to evaluate for infection  05/15/2022, NP 06/24/2022 7:49 PM

## 2022-06-24 NOTE — Progress Notes (Signed)
Judeth Horn in to see pt earlier and discussed d/c plan. Written and verbal d/c instructions given and understanding voiced.

## 2022-06-25 LAB — POCT PREGNANCY, URINE: Preg Test, Ur: NEGATIVE

## 2022-08-20 ENCOUNTER — Encounter (HOSPITAL_COMMUNITY): Payer: Self-pay | Admitting: Emergency Medicine

## 2022-08-20 ENCOUNTER — Emergency Department (HOSPITAL_COMMUNITY): Payer: Medicaid Other

## 2022-08-20 ENCOUNTER — Emergency Department (HOSPITAL_COMMUNITY)
Admission: EM | Admit: 2022-08-20 | Discharge: 2022-08-21 | Disposition: A | Discharge status: Home / Self Care | Payer: Medicaid Other | Attending: Emergency Medicine | Admitting: Emergency Medicine

## 2022-08-20 ENCOUNTER — Other Ambulatory Visit: Payer: Self-pay

## 2022-08-20 DIAGNOSIS — R0789 Other chest pain: Secondary | ICD-10-CM | POA: Diagnosis present

## 2022-08-20 DIAGNOSIS — R0781 Pleurodynia: Secondary | ICD-10-CM | POA: Diagnosis not present

## 2022-08-20 DIAGNOSIS — R1012 Left upper quadrant pain: Secondary | ICD-10-CM | POA: Diagnosis not present

## 2022-08-20 NOTE — ED Provider Triage Note (Signed)
Emergency Medicine Provider Triage Evaluation Note  Francenia Jacuinde , a 31 y.o. female  was evaluated in triage.  Pt complains of chest pain that began @ 1800 tonight. Pain to left lower chest, worse with deep breathing, no alleviating factors. Denies nausea, vomiting, dyspnea, leg pain/swelling, hemoptysis, recent surgery/trauma, recent long travel, hormone use, personal hx of cancer, or hx of DVT/PE. Denies recent injury to the area. .  Review of Systems  Per above  Physical Exam  BP 121/67 (BP Location: Left Arm)   Pulse 80   Temp 98.2 F (36.8 C) (Oral)   Resp 18   SpO2 100%  Gen:   Awake, no distress   Resp:  Normal effort  MSK:   Moves extremities without difficulty  Other:  Left lower chest wall TTP  Medical Decision Making  Medically screening exam initiated at 11:34 PM.  Appropriate orders placed.  Leara Mewborn was informed that the remainder of the evaluation will be completed by another provider, this initial triage assessment does not replace that evaluation, and the importance of remaining in the ED until their evaluation is complete.  Chest pain   Amaryllis Dyke, PA-C 08/20/22 2335

## 2022-08-20 NOTE — ED Triage Notes (Signed)
Pt  reported to ED with c/o left chest pain below breast that radiates into back that began a few hours PTA. Denies N/V or SHOB.

## 2022-08-21 LAB — CBC WITH DIFFERENTIAL/PLATELET
Abs Immature Granulocytes: 0 10*3/uL (ref 0.00–0.07)
Basophils Absolute: 0 10*3/uL (ref 0.0–0.1)
Basophils Relative: 0 %
Eosinophils Absolute: 0.1 10*3/uL (ref 0.0–0.5)
Eosinophils Relative: 2 %
HCT: 37.9 % (ref 36.0–46.0)
Hemoglobin: 12.3 g/dL (ref 12.0–15.0)
Immature Granulocytes: 0 %
Lymphocytes Relative: 41 %
Lymphs Abs: 2.7 10*3/uL (ref 0.7–4.0)
MCH: 29.4 pg (ref 26.0–34.0)
MCHC: 32.5 g/dL (ref 30.0–36.0)
MCV: 90.5 fL (ref 80.0–100.0)
Monocytes Absolute: 0.5 10*3/uL (ref 0.1–1.0)
Monocytes Relative: 8 %
Neutro Abs: 3.2 10*3/uL (ref 1.7–7.7)
Neutrophils Relative %: 49 %
Platelets: 419 10*3/uL — ABNORMAL HIGH (ref 150–400)
RBC: 4.19 MIL/uL (ref 3.87–5.11)
RDW: 12.5 % (ref 11.5–15.5)
WBC: 6.5 10*3/uL (ref 4.0–10.5)
nRBC: 0 % (ref 0.0–0.2)

## 2022-08-21 LAB — TROPONIN I (HIGH SENSITIVITY)
Troponin I (High Sensitivity): <2 ng/L (ref <18)
Troponin I (High Sensitivity): <2 ng/L (ref <18)

## 2022-08-21 LAB — COMPREHENSIVE METABOLIC PANEL
ALT: 23 U/L (ref 0–44)
AST: 19 U/L (ref 15–41)
Albumin: 3.6 g/dL (ref 3.5–5.0)
Alkaline Phosphatase: 98 U/L (ref 38–126)
Anion gap: 7 (ref 5–15)
BUN: 9 mg/dL (ref 6–20)
CO2: 27 mmol/L (ref 22–32)
Calcium: 9.2 mg/dL (ref 8.9–10.3)
Chloride: 106 mmol/L (ref 98–111)
Creatinine, Ser: 0.81 mg/dL (ref 0.44–1.00)
GFR, Estimated: >60 mL/min (ref >60)
Glucose, Bld: 91 mg/dL (ref 70–99)
Potassium: 3.7 mmol/L (ref 3.5–5.1)
Sodium: 140 mmol/L (ref 135–145)
Total Bilirubin: 0.2 mg/dL — ABNORMAL LOW (ref 0.3–1.2)
Total Protein: 6.7 g/dL (ref 6.5–8.1)

## 2022-08-21 LAB — I-STAT BETA HCG BLOOD, ED (MC, WL, AP ONLY): I-stat hCG, quantitative: <5 m[IU]/mL (ref <5)

## 2022-08-21 LAB — LIPASE, BLOOD: Lipase: 34 U/L (ref 11–51)

## 2022-08-21 LAB — D-DIMER, QUANTITATIVE: D-Dimer, Quant: 0.35 ug/mL-FEU (ref 0.00–0.50)

## 2022-08-21 MED ORDER — OXYCODONE-ACETAMINOPHEN 5-325 MG PO TABS
1.0000 | ORAL_TABLET | ORAL | Status: DC | PRN
  Administered 2022-08-21: 1 via ORAL
  Filled 2022-08-21: qty 1

## 2022-08-21 NOTE — ED Provider Notes (Signed)
Received signout from previous provider, please see her note for complete H&P.  This is a 31 year old female who presents with complaints of pleuritic chest pain that started yesterday.  She does not have significant cardiac risk factor or risk factor for PE.  She is PERC negative.  She does not complain of any abdominal pain nausea vomiting.  Her pain was thought to be MSK.  Labs, imaging, and EKG obtained independently viewed and interpreted by me and I agree with radiology dictation.  EKG without acute ischemic changes.  Negative D-dimer low suspicion for PE.  Lipase is normal low suspicion for pancreatitis.  Normal troponin, pregnancy test is negative, electrolyte panels are reassuring, WBC and H&H are normal.  Negative delta troponin.  X-ray of the ribs are negative.  I discussed this finding with patient.  I recommend patient to follow-up with her PCP for outpatient evaluation.  I gave patient strict return precaution.  I have consider hospital admission for her complaint but in the setting of low concerns for ACS, PE, or other acute emergent medical condition along with stable normal vital sign and patient is afebrile, I felt she is stable to be discharged.  All questions was answered to patient's satisfaction.  BP 114/76   Pulse 70   Temp 98.2 F (36.8 C) (Oral)   Resp 19   Ht 5\' 4"  (1.626 m)   Wt 82.1 kg   SpO2 100%   BMI 31.07 kg/m   Results for orders placed or performed during the hospital encounter of 08/20/22  Comprehensive metabolic panel  Result Value Ref Range   Sodium 140 135 - 145 mmol/L   Potassium 3.7 3.5 - 5.1 mmol/L   Chloride 106 98 - 111 mmol/L   CO2 27 22 - 32 mmol/L   Glucose, Bld 91 70 - 99 mg/dL   BUN 9 6 - 20 mg/dL   Creatinine, Ser 0.81 0.44 - 1.00 mg/dL   Calcium 9.2 8.9 - 10.3 mg/dL   Total Protein 6.7 6.5 - 8.1 g/dL   Albumin 3.6 3.5 - 5.0 g/dL   AST 19 15 - 41 U/L   ALT 23 0 - 44 U/L   Alkaline Phosphatase 98 38 - 126 U/L   Total Bilirubin 0.2 (L)  0.3 - 1.2 mg/dL   GFR, Estimated >60 >60 mL/min   Anion gap 7 5 - 15  CBC with Differential  Result Value Ref Range   WBC 6.5 4.0 - 10.5 K/uL   RBC 4.19 3.87 - 5.11 MIL/uL   Hemoglobin 12.3 12.0 - 15.0 g/dL   HCT 37.9 36.0 - 46.0 %   MCV 90.5 80.0 - 100.0 fL   MCH 29.4 26.0 - 34.0 pg   MCHC 32.5 30.0 - 36.0 g/dL   RDW 12.5 11.5 - 15.5 %   Platelets 419 (H) 150 - 400 K/uL   nRBC 0.0 0.0 - 0.2 %   Neutrophils Relative % 49 %   Neutro Abs 3.2 1.7 - 7.7 K/uL   Lymphocytes Relative 41 %   Lymphs Abs 2.7 0.7 - 4.0 K/uL   Monocytes Relative 8 %   Monocytes Absolute 0.5 0.1 - 1.0 K/uL   Eosinophils Relative 2 %   Eosinophils Absolute 0.1 0.0 - 0.5 K/uL   Basophils Relative 0 %   Basophils Absolute 0.0 0.0 - 0.1 K/uL   Immature Granulocytes 0 %   Abs Immature Granulocytes 0.00 0.00 - 0.07 K/uL  D-dimer, quantitative  Result Value Ref Range   D-Dimer,  Quant 0.35 0.00 - 0.50 ug/mL-FEU  Lipase, blood  Result Value Ref Range   Lipase 34 11 - 51 U/L  I-Stat beta hCG blood, ED  Result Value Ref Range   I-stat hCG, quantitative <5.0 <5 mIU/mL   Comment 3          Troponin I (High Sensitivity)  Result Value Ref Range   Troponin I (High Sensitivity) <2 <18 ng/L  Troponin I (High Sensitivity)  Result Value Ref Range   Troponin I (High Sensitivity) <2 <18 ng/L   DG Ribs Unilateral W/Chest Left  Result Date: 08/20/2022 CLINICAL DATA:  Left chest pain EXAM: LEFT RIBS AND CHEST - 3+ VIEW COMPARISON:  06/05/2020 FINDINGS: Lungs are clear.  No pleural effusion or pneumothorax. The heart is normal in size. No displaced left rib fracture is seen. IMPRESSION: Negative. Electronically Signed   By: Charline Bills M.D.   On: 08/20/2022 23:56      Fayrene Helper, PA-C 08/21/22 1621    Charlynne Pander, MD 08/21/22 [phone]

## 2022-08-21 NOTE — ED Provider Notes (Signed)
[place] EMERGENCY DEPARTMENT Provider Note   CSN: [id number] Arrival date & time: 08/20/22  2129     History  Chief Complaint  Patient presents with   Chest Pain    Marilyn Casey is a 31 y.o. female.  HPI 31 year old female presenting with complaints of left lower chest/left upper quadrant pain which began yesterday.  Pain worse with inspiration.  Denies any cough, fevers or chills.  Denies any falls or injuries.  Denies any recent travel.  Not on any estrogen.  No recent surgeries.  No lower extremity swelling.  No hemoptysis, dyspnea, nausea or vomiting.  No history of cancer or autoimmune disease.    Home Medications Prior to Admission medications   Medication Sig Start Date End Date Taking? Authorizing Provider  Multiple Vitamins-Minerals (MULTIVITAMIN WITH MINERALS) tablet Take 1 tablet by mouth daily.   Yes [provider]  iron polysaccharides (NIFEREX) 150 MG capsule Take 1 capsule (150 mg total) by mouth daily. 10/20/21 12/12/21  Gabriel Carina, CNM      Allergies    Patient has no known allergies.    Review of Systems   Review of Systems Ten systems reviewed and are negative for acute change, except as noted in the HPI.   Physical Exam Updated Vital Signs BP 119/74   Pulse 72   Temp 98.1 F (36.7 C) (Oral)   Resp 16   Ht 5\' 4"  (1.626 m)   Wt 82.1 kg   SpO2 100%   BMI 31.07 kg/m  Physical Exam Vitals and nursing note reviewed.  Constitutional:      General: She is not in acute distress.    Appearance: She is well-developed.  HENT:     Head: Normocephalic and atraumatic.  Eyes:     Conjunctiva/sclera: Conjunctivae normal.  Cardiovascular:     Rate and Rhythm: Normal rate and regular rhythm.     Heart sounds: No murmur heard. Pulmonary:     Effort: Pulmonary effort is normal. No respiratory distress.     Breath sounds: Normal breath sounds.  Chest:       Comments: Mild tenderness to the left lower rib cage,  abdomen soft and nontender, no other reproducible chest wall tenderness Abdominal:     Palpations: Abdomen is soft.     Tenderness: There is no abdominal tenderness.  Musculoskeletal:        General: No swelling.     Cervical back: Neck supple.  Skin:    General: Skin is warm and dry.     Capillary Refill: Capillary refill takes less than 2 seconds.  Neurological:     Mental Status: She is alert.  Psychiatric:        Mood and Affect: Mood normal.     ED Results / Procedures / Treatments   Labs (all labs ordered are listed, but only abnormal results are displayed) Labs Reviewed  COMPREHENSIVE METABOLIC PANEL - Abnormal; Notable for the following components:      Result Value   Total Bilirubin 0.2 (*)    All other components within normal limits  CBC WITH DIFFERENTIAL/PLATELET - Abnormal; Notable for the following components:   Platelets 419 (*)    All other components within normal limits  D-DIMER, QUANTITATIVE  LIPASE, BLOOD  I-STAT BETA HCG BLOOD, ED ([facility], [facility], [facility] ONLY)  TROPONIN I (HIGH SENSITIVITY)  TROPONIN I (HIGH SENSITIVITY)    EKG EKG Interpretation  Date/Time:  Thursday August 21 2022 14:13:30 EDT Ventricular Rate:  72  PR Interval:  139 QRS Duration: 87 QT Interval:  390 QTC Calculation: 427 R Axis:   88 Text Interpretation: Sinus rhythm No significant change since last tracing Confirmed by Jacalyn Lefevre [phone]) on 08/21/2022 2:17:51 PM  Radiology DG Ribs Unilateral W/Chest Left  Result Date: 08/20/2022 CLINICAL DATA:  Left chest pain EXAM: LEFT RIBS AND CHEST - 3+ VIEW COMPARISON:  06/05/2020 FINDINGS: Lungs are clear.  No pleural effusion or pneumothorax. The heart is normal in size. No displaced left rib fracture is seen. IMPRESSION: Negative. Electronically Signed   By: Charline Bills M.D.   On: 08/20/2022 23:56    Procedures Procedures    Medications Ordered in ED Medications  oxyCODONE-acetaminophen (PERCOCET/ROXICET) 5-325 MG per tablet 1  tablet (1 tablet Oral Given 08/21/22 1300)    ED Course/ Medical Decision Making/ A&P                           Medical Decision Making Amount and/or Complexity of Data Reviewed Labs: ordered.  Risk Prescription drug management.   31 year old female presented to the ER with complaints of left lower chest pain/left upper quadrant pain.  Vitals overall reassuring.  She has some tenderness to the left lower rib cage but no visible deformities or bruising.  She reports pain on inspiration.  DX includes musculoskeletal pain, rib fracture, pancreatitis, pneumonia, PE.  Labs ordered in triage, reviewed and interpreted by me.  CMP largely unremarkable, CBC without leukocytosis, troponin less than 2.  Chest x-ray ordered, reviewed, agree with radiology read, negative for acute findings.  EKG nonischemic.  Will obtain D-dimer and a lipase as well.  Care signed out to Ardelle Park who will oversee the rest of her workup and discharge appropriately.  Final Clinical Impression(s) / ED Diagnoses Final diagnoses:  Rib pain on left side    Rx / DC Orders ED Discharge Orders     None         Leone Brand 08/21/22 1518    Jacalyn Lefevre, MD 08/22/22 1553

## 2022-08-21 NOTE — Discharge Instructions (Signed)
You were evaluated in the Emergency Department and after careful evaluation, we did not find any emergent condition requiring admission or further testing in the hospital.  You likely pulled a muscle in your left rib area. You can apply ice/heat, take Tylenol or Ibuprofen. Sometimes these injuries can take time a long time to heal.  Please return to the Emergency Department if you experience any worsening of your condition.  We encourage you to follow up with a primary care provider.  Thank you for allowing [place] to be a part of your care.

## 2022-08-21 NOTE — ED Notes (Signed)
Pt cleared to eat and drink

## 2023-02-11 IMAGING — US US MFM OB COMP +14 WKS
1 series · 14 of 28 positions shown · non-contrast
Comparison: none

[Series 1: us mfm ob comp +14 wks · 14 of 34 slices shown]
[im 2/34]
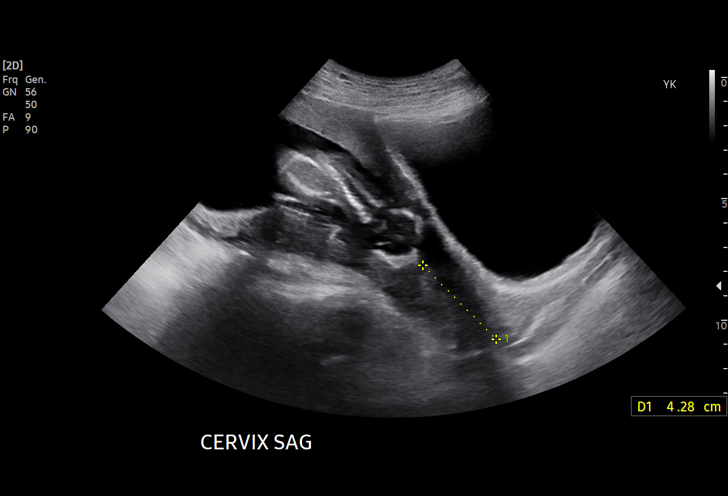
[im 4/34]
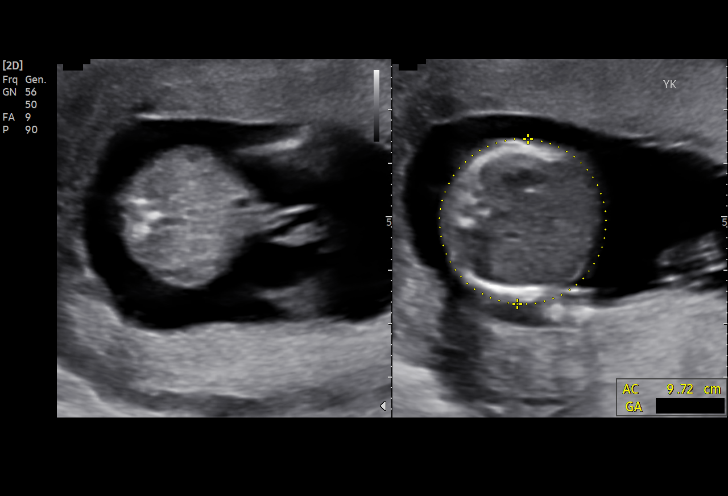
[im 7/34]
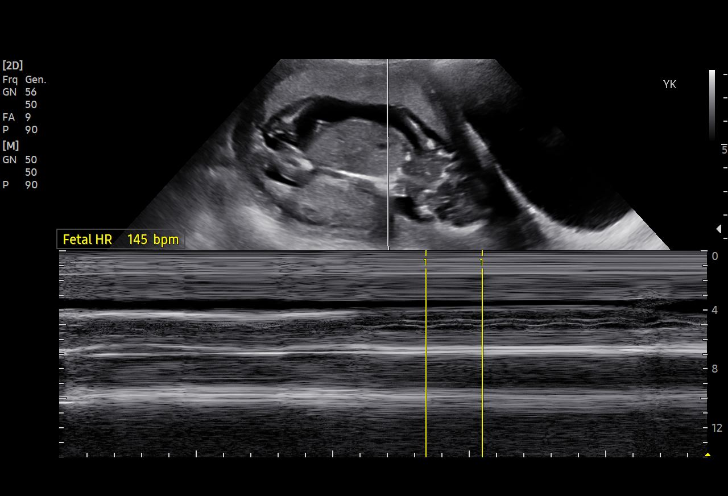
[im 9/34]
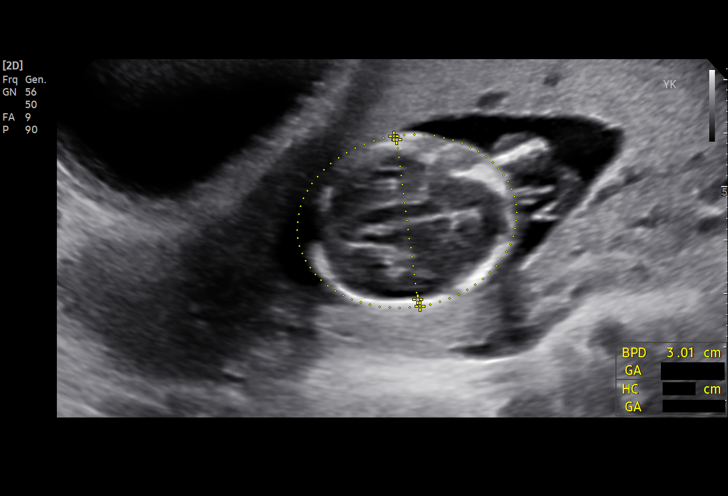
[im 12/34]
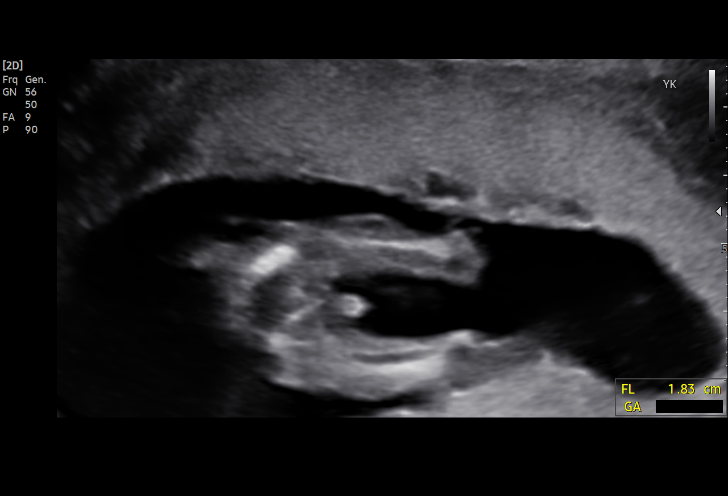
[im 14/34]
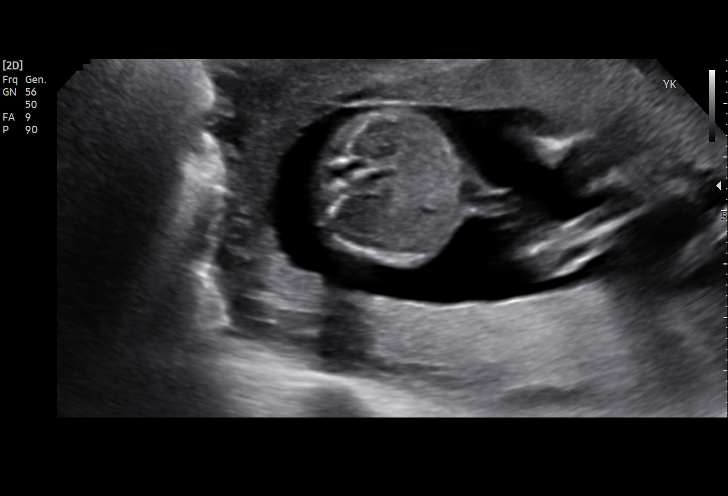
[im 16/34]
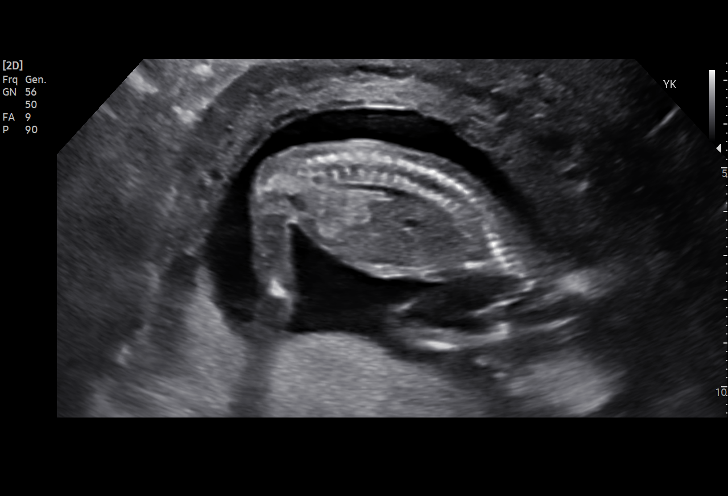
[im 19/34]
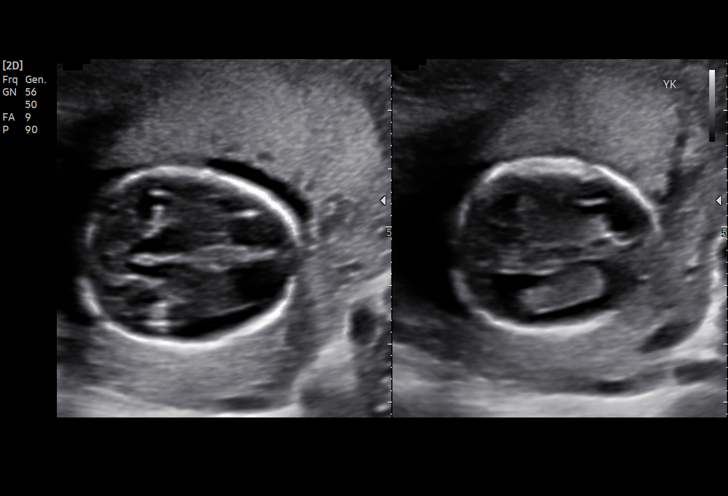
[im 21/34]
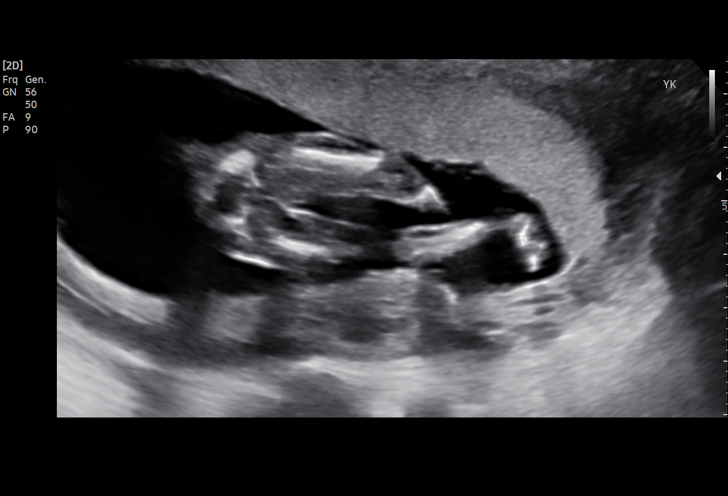
[im 24/34]
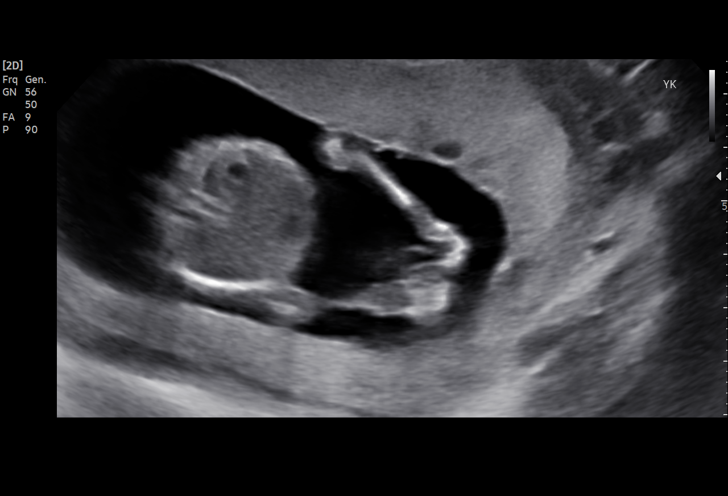
[im 26/34]
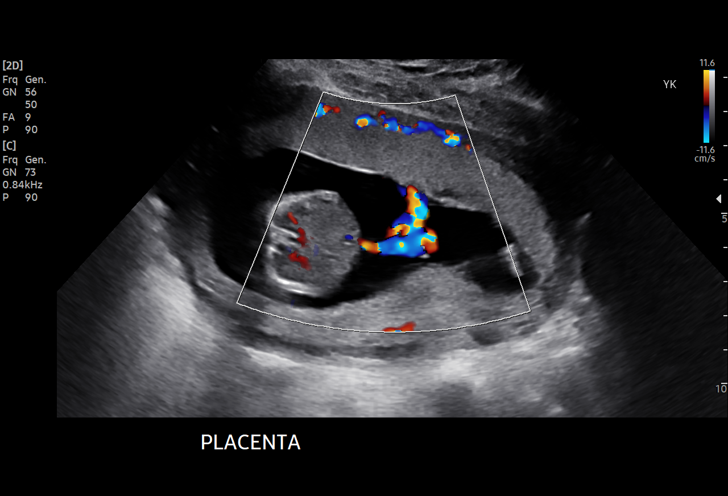
[im 29/34]
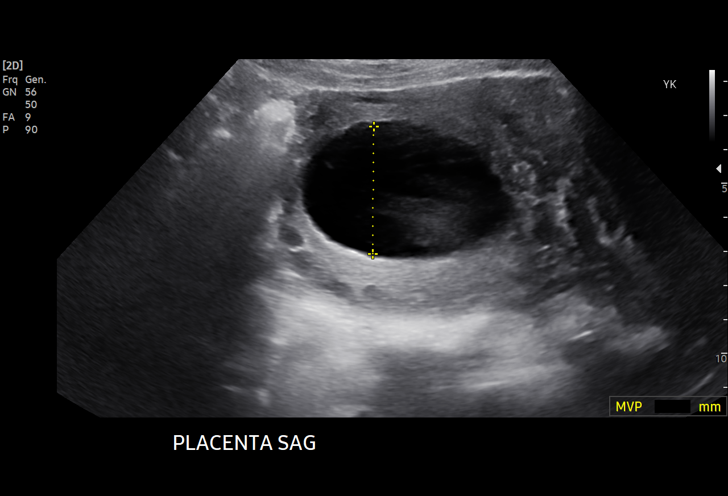
[im 31/34]
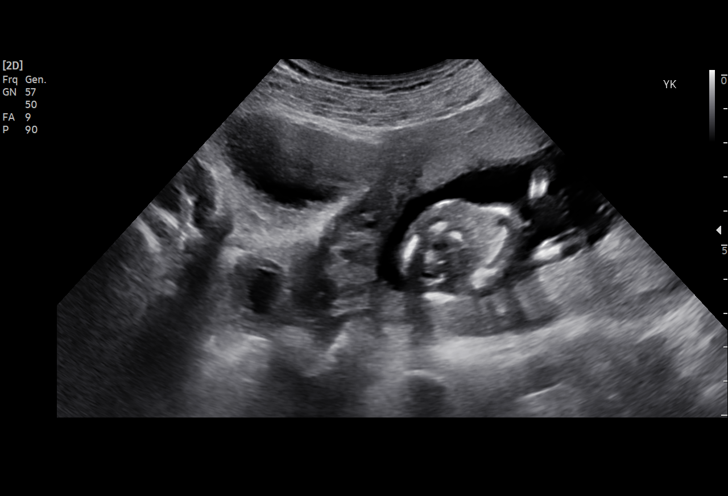
[im 34/34]
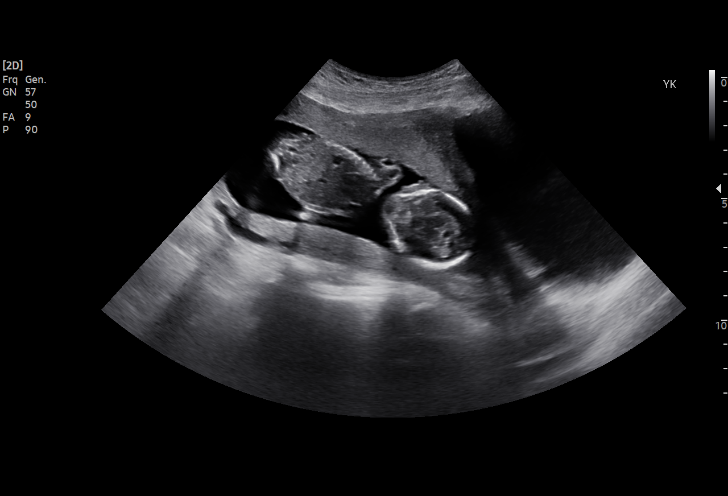

[14 of 28 positions shown; findings below may reference images not displayed]

[REDACTED]care
                   WALKER CNM

 1  US MFM OB COMP + 14 WK                76805.01    NUSETOR LUCY

Indications

 15 weeks gestation of pregnancy
 Encounter for uncertain dates

Fetal Evaluation

 Num Of Fetuses:         1
 Fetal Heart Rate(bpm):  143
 Cardiac Activity:       Observed
 Presentation:           Cephalic
 Placenta:               Anterior
 P. Cord Insertion:      Visualized, central

 Amniotic Fluid
 AFI FV:      Within normal limits

                             Largest Pocket(cm)

Biometry

 BPD:      30.3  mm     G. Age:  15w 4d         37  %    CI:        69.32   %    70 - 86
                                                         FL/HC:      15.7   %    13.3 -
 HC:      116.2  mm     G. Age:  15w 5d         34  %    HC/AC:      1.17        1.05 -
 AC:       99.4  mm     G. Age:  16w 0d         62  %    FL/BPD:     60.1   %
 FL:       18.2  mm     G. Age:  15w 3d         32  %    FL/AC:      18.3   %    20 - 24

 Est. FW:     132  gm      0 lb 5 oz     38  %
OB History

 Gravidity:    5         Prem:   2         SAB:   1
 Ectopic:      1
Gestational Age

 U/S Today:     15w 5d                                        EDD:   01/04/22
 Best:          15w 5d     Det. By:  U/S (07/18/21)           EDD:   01/04/22
Anatomy

 Cranium:               Appears normal         Abdominal Wall:         Appears nml (cord
                                                                       insert, abd wall)
 Cavum:                 Appears normal         Kidneys:                Appear normal
 Ventricles:            Appears normal         Bladder:                Appears normal
 Choroid Plexus:        Appears normal         Spine:                  Appears normal
 Thoracic:              Appears normal         Upper Extremities:      Visualized
 Stomach:               Appears normal, left   Lower Extremities:      Visualized
                        sided
 Abdomen:               Appears normal
Cervix Uterus Adnexa

 Cervix
 Length:           4.28  cm.
 Normal appearance by transabdominal scan.

 Uterus
 No abnormality visualized.

 Right Ovary
 Within normal limits.

 Left Ovary
 Within normal limits.

 Cul De Sac
 No free fluid seen.

 Adnexa
 No abnormality visualized.
Impression

 Single intrauterine pregnancy here for a dating exam
 Normal anatomy with measurements consistent with 15w 5d
 by today's examination.
 There is good fetal movement and amniotic fluid volume
 Suboptimal views of the fetal anatomy were obtained
 secondary to fetal position and early gestational age.

 I discussed with Ms. Tiger her new EDD of 01/04/22

 All questions answered.
Recommendations

 Detailed exam at 18-20 weeks.

## 2023-03-24 ENCOUNTER — Other Ambulatory Visit: Payer: Self-pay

## 2023-03-24 ENCOUNTER — Emergency Department (HOSPITAL_COMMUNITY): Payer: Medicaid Other

## 2023-03-24 ENCOUNTER — Emergency Department (HOSPITAL_COMMUNITY)
Admission: EM | Admit: 2023-03-24 | Discharge: 2023-03-25 | Disposition: A | Discharge status: Home / Self Care | Payer: Medicaid Other | Attending: Emergency Medicine | Admitting: Emergency Medicine

## 2023-03-24 ENCOUNTER — Encounter (HOSPITAL_COMMUNITY): Payer: Self-pay

## 2023-03-24 DIAGNOSIS — O26891 Other specified pregnancy related conditions, first trimester: Secondary | ICD-10-CM | POA: Insufficient documentation

## 2023-03-24 DIAGNOSIS — R0789 Other chest pain: Secondary | ICD-10-CM | POA: Diagnosis not present

## 2023-03-24 DIAGNOSIS — Z3A01 Less than 8 weeks gestation of pregnancy: Secondary | ICD-10-CM | POA: Diagnosis not present

## 2023-03-24 LAB — BASIC METABOLIC PANEL
Anion gap: 9 (ref 5–15)
BUN: 10 mg/dL (ref 6–20)
CO2: 24 mmol/L (ref 22–32)
Calcium: 9.4 mg/dL (ref 8.9–10.3)
Chloride: 103 mmol/L (ref 98–111)
Creatinine, Ser: 0.94 mg/dL (ref 0.44–1.00)
GFR, Estimated: >60 mL/min (ref >60)
Glucose, Bld: 90 mg/dL (ref 70–99)
Potassium: 3.7 mmol/L (ref 3.5–5.1)
Sodium: 136 mmol/L (ref 135–145)

## 2023-03-24 LAB — I-STAT BETA HCG BLOOD, ED (MC, WL, AP ONLY): I-stat hCG, quantitative: >2000 m[IU]/mL — ABNORMAL HIGH (ref <5)

## 2023-03-24 LAB — CBC
HCT: 35.3 % — ABNORMAL LOW (ref 36.0–46.0)
Hemoglobin: 11.7 g/dL — ABNORMAL LOW (ref 12.0–15.0)
MCH: 29 pg (ref 26.0–34.0)
MCHC: 33.1 g/dL (ref 30.0–36.0)
MCV: 87.6 fL (ref 80.0–100.0)
Platelets: 484 10*3/uL — ABNORMAL HIGH (ref 150–400)
RBC: 4.03 MIL/uL (ref 3.87–5.11)
RDW: 12.5 % (ref 11.5–15.5)
WBC: 7.1 10*3/uL (ref 4.0–10.5)
nRBC: 0 % (ref 0.0–0.2)

## 2023-03-24 LAB — HCG, QUANTITATIVE, PREGNANCY: hCG, Beta Chain, Quant, S: 47932 m[IU]/mL — ABNORMAL HIGH (ref <5)

## 2023-03-24 LAB — TROPONIN I (HIGH SENSITIVITY): Troponin I (High Sensitivity): <2 ng/L (ref <18)

## 2023-03-24 NOTE — ED Triage Notes (Signed)
Pt arrives with c/o midsternal CP that started last night. Pt did have strep a week ago. Per pt, the pain is worse with deep breathing. Pt is currently pregnant, but unsure of how far along she is. Pt denies SOB.

## 2023-03-25 ENCOUNTER — Emergency Department (HOSPITAL_COMMUNITY): Payer: Medicaid Other

## 2023-03-25 LAB — D-DIMER, QUANTITATIVE: D-Dimer, Quant: 0.31 ug/mL-FEU (ref 0.00–0.50)

## 2023-03-25 LAB — TROPONIN I (HIGH SENSITIVITY): Troponin I (High Sensitivity): <2 ng/L (ref <18)

## 2023-03-25 MED ORDER — FAMOTIDINE 20 MG PO TABS: 20.0000 mg | ORAL_TABLET | Freq: Every day | ORAL | 0 refills | Status: DC

## 2023-03-25 NOTE — ED Provider Notes (Signed)
[facility] AT [facility] Provider Note   CSN: [id number] Arrival date & time: 03/24/23  2052     History  Chief Complaint  Patient presents with   Chest Pain    Marilyn Casey is a 32 y.o. female.  Patient with anterior chest pain intermittent pain worse with breathing since yesterday.  Pain last for few seconds at a time and radiates to her upper back.  No associated shortness of breath, cough or fever.  Had strep throat a week ago which is now improved.  No history of PE or DVT.  She is currently pregnant and she estimates about 8 weeks.  Last menstrual cycle was in March.  She denies any vaginal bleeding, abdominal pain or discharge.  No nausea or vomiting, cough or fever.  No history of COPD or asthma.  No cardiac history.  No history of acid reflux or ulcers. Intermittent central chest pain coming and going lasting for a few seconds at a time is worse with breathing.  Does not feel short of breath at rest  The history is provided by the patient.  Chest Pain Associated symptoms: shortness of breath   Associated symptoms: no abdominal pain, no dizziness, no fever, no headache, no nausea, no vomiting and no weakness        Home Medications Prior to Admission medications   Medication Sig Start Date End Date Taking? Authorizing Provider  Multiple Vitamins-Minerals (MULTIVITAMIN WITH MINERALS) tablet Take 1 tablet by mouth daily.    [provider]  iron polysaccharides (NIFEREX) 150 MG capsule Take 1 capsule (150 mg total) by mouth daily. 10/20/21 12/12/21  Bernerd Limbo, CNM      Allergies    Patient has no known allergies.    Review of Systems   Review of Systems  Constitutional:  Negative for activity change, appetite change and fever.  HENT:  Negative for congestion and rhinorrhea.   Respiratory:  Positive for chest tightness and shortness of breath.   Cardiovascular:  Positive for chest pain.  Gastrointestinal:  Negative for  abdominal pain, nausea and vomiting.  Genitourinary:  Negative for dysuria and hematuria.  Musculoskeletal:  Negative for arthralgias and myalgias.  Skin:  Negative for rash.  Neurological:  Negative for dizziness, weakness, light-headedness and headaches.   all other systems are negative except as noted in the HPI and PMH.    Physical Exam Updated Vital Signs BP 120/70 (BP Location: Left Arm)   Pulse 76   Temp 98 F (36.7 C)   Resp (!) 22   Wt 81.6 kg   LMP 03/15/2022   SpO2 100%   BMI 30.90 kg/m  Physical Exam Vitals and nursing note reviewed.  Constitutional:      General: She is not in acute distress.    Appearance: Normal appearance. She is well-developed and normal weight. She is not ill-appearing.  HENT:     Head: Normocephalic and atraumatic.     Mouth/Throat:     Pharynx: No oropharyngeal exudate.  Eyes:     Conjunctiva/sclera: Conjunctivae normal.     Pupils: Pupils are equal, round, and reactive to light.  Neck:     Comments: No meningismus. Cardiovascular:     Rate and Rhythm: Normal rate and regular rhythm.     Heart sounds: Normal heart sounds. No murmur heard. Pulmonary:     Effort: Pulmonary effort is normal. No respiratory distress.     Breath sounds: Normal breath sounds.  Chest:  Chest wall: Tenderness present.  Abdominal:     Palpations: Abdomen is soft.     Tenderness: There is no abdominal tenderness. There is no guarding or rebound.  Musculoskeletal:        General: No tenderness. Normal range of motion.     Cervical back: Normal range of motion and neck supple.  Skin:    General: Skin is warm.     Capillary Refill: Capillary refill takes less than 2 seconds.  Neurological:     General: No focal deficit present.     Mental Status: She is alert and oriented to person, place, and time. Mental status is at baseline.     Cranial Nerves: No cranial nerve deficit.     Motor: No abnormal muscle tone.     Coordination: Coordination normal.      Comments:  5/5 strength throughout. CN 2-12 intact.Equal grip strength.   Psychiatric:        Behavior: Behavior normal.     ED Results / Procedures / Treatments   Labs (all labs ordered are listed, but only abnormal results are displayed) Labs Reviewed  CBC - Abnormal; Notable for the following components:      Result Value   Hemoglobin 11.7 (*)    HCT 35.3 (*)    Platelets 484 (*)    All other components within normal limits  HCG, QUANTITATIVE, PREGNANCY - Abnormal; Notable for the following components:   hCG, Beta Chain, Quant, S [id number] (*)    All other components within normal limits  I-STAT BETA HCG BLOOD, ED ([facility], [facility], [facility] ONLY) - Abnormal; Notable for the following components:   I-stat hCG, quantitative >2,000.0 (*)    All other components within normal limits  BASIC METABOLIC PANEL  D-DIMER, QUANTITATIVE  TROPONIN I (HIGH SENSITIVITY)  TROPONIN I (HIGH SENSITIVITY)    EKG EKG Interpretation  Date/Time:  Tuesday Mar 24 2023 20:47:37 EDT Ventricular Rate:  74 PR Interval:  140 QRS Duration: 80 QT Interval:  362 QTC Calculation: 401 R Axis:   91 Text Interpretation: Normal sinus rhythm Rightward axis Borderline ECG When compared with ECG of 21-Aug-2022 14:13, PREVIOUS ECG IS PRESENT No significant change was found Confirmed by Glynn Octave [phone]) on 03/25/2023 2:03:26 AM  Radiology US OB LESS THAN 14 WEEKS WITH OB TRANSVAGINAL  Result Date: 03/25/2023 CLINICAL DATA:  Abdominal pain. EXAM: OBSTETRIC <14 WK [place] AND TRANSVAGINAL OB US TECHNIQUE: Both transabdominal and transvaginal ultrasound examinations were performed for complete evaluation of the gestation as well as the maternal uterus, adnexal regions, and pelvic cul-de-sac. Transvaginal technique was performed to assess early pregnancy. COMPARISON:  None Available. FINDINGS: Intrauterine gestational sac: Single Yolk sac:  Visualized. Embryo:  Not Visualized. Cardiac Activity: Not Visualized. Heart Rate: N/A  bpm  MSD: 20.08 mm   6 w   6 d Subchorionic hemorrhage:  None visualized. Maternal uterus/adnexae: The bilateral ovaries are visualized and are normal in appearance. No pelvic free fluid is seen. IMPRESSION: Probable early intrauterine gestational sac and yolk sac, but no fetal pole or cardiac activity yet visualized. Recommend follow-up quantitative B-HCG levels and follow-up US in 14 days to assess viability. This recommendation follows SRU consensus guidelines: Diagnostic Criteria for Nonviable Pregnancy Early in the First Trimester. Malva Limes Med 2013; 604:5409-81. Electronically Signed   By: Aram Candela M.D.   On: 03/25/2023 03:16   DG Chest 2 View  Result Date: 03/24/2023 CLINICAL DATA:  Chest pain EXAM: CHEST - 2 VIEW COMPARISON:  08/20/2022 FINDINGS: The heart size and mediastinal contours are within normal limits. Both lungs are clear. The visualized skeletal structures are unremarkable. IMPRESSION: No active cardiopulmonary disease. Electronically Signed   By: Charlett Nose M.D.   On: 03/24/2023 21:31    Procedures Procedures    Medications Ordered in ED Medications - No data to display  ED Course/ Medical Decision Making/ A&P                             Medical Decision Making Amount and/or Complexity of Data Reviewed Labs: ordered. Decision-making details documented in ED Course. Radiology: ordered and independent interpretation performed. Decision-making details documented in ED Course. ECG/medicine tests: ordered and independent interpretation performed. Decision-making details documented in ED Course.   Pleuritic chest pain since yesterday.  No hypoxia or increased work of breathing.  EKG is sinus rhythm without acute ST changes.  Clear lungs bilaterally.  No wheezing.  Chest x-ray obtained in triage is negative for infiltrate or pneumothorax.  Results reviewed and interpreted by me  Troponin negative.  Low suspicion for ACS.  Pulmonary embolism is considered given her  pleuritic chest pain in the setting of pregnancy.  She is not hypoxic or tachycardic.  Per years algorithm, patient is low risk for PE.  Her D-dimer is within normal limits.  Ultrasound confirms intrauterine gestational sac but no visible fetal pole or yolk sac.  Troponin negative x 2.  Low suspicion for ACS. D-dimer is negative with low suspicion for PE.  Ultrasound results discussed with the patient and need for follow-up in 10 to 14 days for recheck of presence of fetal pole.  Will initiate Pepcid, avoid alcohol, caffeine, NSAID medications, spicy foods.  Follow-up with [facility] doctor for recheck of ultrasound next week.  Return to the ED with new or worsening symptoms        Final Clinical Impression(s) / ED Diagnoses Final diagnoses:  Atypical chest pain  Less than [redacted] weeks gestation of pregnancy    Rx / DC Orders ED Discharge Orders     None         Annabell Oconnor, Jeannett Senior, MD 03/25/23 [phone]

## 2023-03-25 NOTE — Discharge Instructions (Addendum)
No evidence of heart attack or blood clot in your lung on your testing today.  Your ultrasound shows a pregnancy in the correct location but no fetus is yet visible.  You should have a repeat ultrasound in 10 to 14 days by your [facility] doctor. Return to the ED sooner with difficulty breathing, chest pain, abdominal pain, vaginal bleeding or any other concerns.

## 2023-07-27 ENCOUNTER — Ambulatory Visit (HOSPITAL_COMMUNITY)
Admission: EM | Admit: 2023-07-27 | Discharge: 2023-07-27 | Disposition: A | Discharge status: Home / Self Care | Payer: Medicaid Other | Attending: Family Medicine | Admitting: Family Medicine

## 2023-07-27 ENCOUNTER — Encounter (HOSPITAL_COMMUNITY): Payer: Self-pay

## 2023-07-27 DIAGNOSIS — B9789 Other viral agents as the cause of diseases classified elsewhere: Secondary | ICD-10-CM | POA: Diagnosis not present

## 2023-07-27 DIAGNOSIS — J028 Acute pharyngitis due to other specified organisms: Secondary | ICD-10-CM | POA: Diagnosis not present

## 2023-07-27 LAB — POCT RAPID STREP A (OFFICE): Rapid Strep A Screen: NEGATIVE

## 2023-07-27 MED ORDER — CHLORHEXIDINE GLUCONATE 0.12 % MT SOLN: 15.0000 mL | Freq: Three times a day (TID) | OROMUCOSAL | 0 refills | Status: DC

## 2023-07-27 NOTE — ED Provider Notes (Signed)
[facility]    CSN: [id number] Arrival date & time: 07/27/23  0827      History   Chief Complaint No chief complaint on file.   HPI Marilyn Casey is a 32 y.o. female.   Patient presenting with 3-day history of sore throat.  Patient states that her children were sick approximately 2 weeks ago.  Patient states that she is having sharp pain whenever she tries to swallow.  Patient denies any fevers, chills or any muscle aches.  Patient says that she has no other symptoms other than the pain with swallowing.  Patient otherwise has no other concerns at this time.  Patient notes that she is not currently pregnant.     Past Medical History:  Diagnosis Date   Anemia    Gestational diabetes    Preeclampsia     Patient Active Problem List   Diagnosis Date Noted   Postoperative anemia due to acute blood loss 12/11/2021   Status post repeat low transverse cesarean section 12/10/2021   Severe preeclampsia, delivered 12/10/2021   Rh negative state in antepartum period 12/10/2021   Gestational diabetes 10/30/2021    Past Surgical History:  Procedure Laterality Date   CESAREAN SECTION     CESAREAN SECTION N/A 12/10/2021   Procedure: CESAREAN SECTION;  Surgeon: Levie Heritage, DO;  Location: [facility] LD ORS;  Service: Obstetrics;  Laterality: N/A;   ECTOPIC PREGNANCY SURGERY      OB History     Gravida  6   Para  3   Term  0   Preterm  3   AB  2   Living  3      SAB  1   IAB      Ectopic  1   Multiple  0   Live Births  3            Home Medications    Prior to Admission medications   Medication Sig Start Date End Date Taking? Authorizing Provider  chlorhexidine (PERIDEX) 0.12 % solution Use as directed 15 mLs in the mouth or throat in the morning, at noon, and at bedtime. 07/27/23  Yes Brenton Grills, MD  famotidine (PEPCID) 20 MG tablet Take 1 tablet (20 mg total) by mouth daily. 03/25/23  Yes Rancour, Jeannett Senior, MD  Multiple Vitamins-Minerals  (MULTIVITAMIN WITH MINERALS) tablet Take 1 tablet by mouth daily.   Yes [provider]  iron polysaccharides (NIFEREX) 150 MG capsule Take 1 capsule (150 mg total) by mouth daily. 10/20/21 12/12/21  Bernerd Limbo, CNM    Family History Family History  Problem Relation Age of Onset   Diabetes Mother     Social History Social History   Tobacco Use   Smoking status: Former    Current packs/day: 0.00    Types: Cigarettes    Quit date: 06/10/2021    Years since quitting: 2.1   Smokeless tobacco: Never  Vaping Use   Vaping status: Never Used  Substance Use Topics   Alcohol use: Not Currently   Drug use: Never     Allergies   Patient has no known allergies.   Review of Systems Review of Systems  Constitutional:  Negative for chills and fever.  HENT:  Negative for ear pain and sore throat.   Eyes:  Negative for pain and visual disturbance.  Respiratory:  Negative for cough and shortness of breath.   Cardiovascular:  Negative for chest pain and palpitations.  Gastrointestinal:  Negative for abdominal pain  and vomiting.  Genitourinary:  Negative for dysuria and hematuria.  Musculoskeletal:  Negative for arthralgias and back pain.  Skin:  Negative for color change and rash.  Neurological:  Negative for seizures and syncope.  All other systems reviewed and are negative.    Physical Exam Triage Vital Signs ED Triage Vitals [07/27/23 0901]  Encounter Vitals Group     BP 123/67     Systolic BP Percentile      Diastolic BP Percentile      Pulse Rate 85     Resp 16     Temp 98.1 F (36.7 C)     Temp Source Oral     SpO2 98 %     Weight      Height      Head Circumference      Peak Flow      Pain Score      Pain Loc      Pain Education      Exclude from Growth Chart    No data found.  Updated Vital Signs BP 123/67 (BP Location: Left Arm)   Pulse 85   Temp 98.1 F (36.7 C) (Oral)   Resp 16   LMP 07/13/2023   SpO2 98%   Visual Acuity Right Eye  Distance:   Left Eye Distance:   Bilateral Distance:    Right Eye Near:   Left Eye Near:    Bilateral Near:     Physical Exam Constitutional:      General: She is not in acute distress.    Appearance: Normal appearance.  HENT:     Nose: Nose normal. No congestion.     Mouth/Throat:     Mouth: Mucous membranes are moist.     Pharynx: Oropharynx is clear. Posterior oropharyngeal erythema present. No oropharyngeal exudate.     Comments: No signs of any exudates in the posterior pharynx.  There is some erythema the posterior pharynx with mild swelling. Cardiovascular:     Rate and Rhythm: Normal rate.  Neurological:     Mental Status: She is alert.      UC Treatments / Results  Labs (all labs ordered are listed, but only abnormal results are displayed) Labs Reviewed  POCT RAPID STREP A (OFFICE)    EKG   Radiology No results found.  Procedures Procedures (including critical care time)  Medications Ordered in UC Medications - No data to display  Initial Impression / Assessment and Plan / UC Course  I have reviewed the triage vital signs and the nursing notes.  Pertinent labs & imaging results that were available during my care of the patient were reviewed by me and considered in my medical decision making (see chart for details).     Patient's symptoms and physical exam are likely consistent with a viral sore throat.  Patient strep test is negative.  At this time patient would benefit from conservative management but will also send patient Peridex and patient can mouthwash/gargle 3 times a day for the next few days.  Advised patient to alternate Tylenol and ibuprofen for the pain.  Patient to follow-up if no improvement. Final Clinical Impressions(s) / UC Diagnoses   Final diagnoses:  Viral sore throat     Discharge Instructions      Please use the chlorhexidine mouthwash 3 times a day.  Please gargle with it as well. Please alternate ibuprofen and Tylenol.   You may take ibuprofen 400 mg twice a day and you may alternate that  in between with Tylenol 1000 mg twice a day.  If you feel like you are still not getting better please give [place] a call or make an appointment.      ED Prescriptions     Medication Sig Dispense Auth. Provider   chlorhexidine (PERIDEX) 0.12 % solution Use as directed 15 mLs in the mouth or throat in the morning, at noon, and at bedtime. 120 mL Brenton Grills, MD      PDMP not reviewed this encounter.   Brenton Grills, MD 07/27/23 [phone]

## 2023-07-27 NOTE — Discharge Instructions (Signed)
Please use the chlorhexidine mouthwash 3 times a day.  Please gargle with it as well. Please alternate ibuprofen and Tylenol.  You may take ibuprofen 400 mg twice a day and you may alternate that in between with Tylenol 1000 mg twice a day.  If you feel like you are still not getting better please give [place] a call or make an appointment.

## 2023-07-27 NOTE — ED Triage Notes (Signed)
Pt reports sore throat x 3 days. Pt reports no other symptoms at this time.

## 2023-10-06 ENCOUNTER — Ambulatory Visit (HOSPITAL_COMMUNITY)
Admission: EM | Admit: 2023-10-06 | Discharge: 2023-10-06 | Disposition: A | Discharge status: Home / Self Care | Payer: Medicaid Other | Attending: Family Medicine | Admitting: Family Medicine

## 2023-10-06 ENCOUNTER — Encounter (HOSPITAL_COMMUNITY): Payer: Self-pay | Admitting: *Deleted

## 2023-10-06 DIAGNOSIS — J0100 Acute maxillary sinusitis, unspecified: Secondary | ICD-10-CM | POA: Diagnosis not present

## 2023-10-06 DIAGNOSIS — J029 Acute pharyngitis, unspecified: Secondary | ICD-10-CM

## 2023-10-06 DIAGNOSIS — R051 Acute cough: Secondary | ICD-10-CM

## 2023-10-06 MED ORDER — AMOXICILLIN-POT CLAVULANATE 875-125 MG PO TABS: 1.0000 | ORAL_TABLET | Freq: Two times a day (BID) | ORAL | 0 refills | Status: DC

## 2023-10-06 MED ORDER — PROMETHAZINE-DM 6.25-15 MG/5ML PO SYRP: 5.0000 mL | ORAL_SOLUTION | Freq: Four times a day (QID) | ORAL | 0 refills | Status: DC | PRN

## 2023-10-06 NOTE — ED Provider Notes (Signed)
[facility]   [id number] 10/06/23 Arrival Time: 7829  ASSESSMENT & PLAN:  1. Acute cough   2. Sore throat   3. Acute non-recurrent maxillary sinusitis     Meds ordered this encounter  Medications   amoxicillin-clavulanate (AUGMENTIN) 875-125 MG tablet    Sig: Take 1 tablet by mouth every 12 (twelve) hours.    Dispense:  14 tablet    Refill:  0   promethazine-dextromethorphan (PROMETHAZINE-DM) 6.25-15 MG/5ML syrup    Sig: Take 5 mLs by mouth 4 (four) times daily as needed for cough.    Dispense:  118 mL    Refill:  0   Discussed typical duration of symptoms. OTC symptom care as needed. Ensure adequate fluid intake and rest.   Follow-up Information     [facility] [place].   Specialty: Urgent Care Why: If worsening or failing to improve as anticipated. Contact information: [address] [place] [address] [phone]                Reviewed expectations re: course of current medical issues. Questions answered. Outlined signs and symptoms indicating need for more acute intervention. Patient verbalized understanding. After Visit Summary given.   SUBJECTIVE: History from: patient.  Marilyn Casey is a 32 y.o. female who presents with complaint of nasal congestion, post-nasal drainage, and sinus pain.With throat irritation. Onset gradual, a week ago. Respiratory symptoms: occas dry cough. Fever: absent. Overall normal PO intake without n/v. OTC treatment: various without much relief. Seasonal allergies: no. History of frequent sinus infections: no. No specific aggravating or alleviating factors reported. Social History   Tobacco Use  Smoking Status Former   Current packs/day: 0.00   Types: Cigarettes   Quit date: 06/10/2021   Years since quitting: 2.3  Smokeless Tobacco Never    OBJECTIVE:  Vitals:   10/06/23 1013  BP: 115/64  Pulse: (!) 53  Resp: 18  Temp: 97.8 F (36.6 C)  TempSrc: Oral  SpO2:  98%    General appearance: alert; no distress HEENT: nasal congestion; clear runny nose; throat irritation secondary to post-nasal drainage; bilateral maxillary tenderness to palpation; turbinates boggy Neck: supple without LAD; trachea midline Lungs: unlabored respirations, symmetrical air entry; cough: absent; no respiratory distress Skin: warm and dry Psychological: alert and cooperative; normal mood and affect  No Known Allergies  Past Medical History:  Diagnosis Date   Anemia    Gestational diabetes    Preeclampsia    Family History  Problem Relation Age of Onset   Diabetes Mother    Social History   Socioeconomic History   Marital status: Single    Spouse name: Not on file   Number of children: Not on file   Years of education: Not on file   Highest education level: Not on file  Occupational History   Not on file  Tobacco Use   Smoking status: Former    Current packs/day: 0.00    Types: Cigarettes    Quit date: 06/10/2021    Years since quitting: 2.3   Smokeless tobacco: Never  Vaping Use   Vaping status: Never Used  Substance and Sexual Activity   Alcohol use: Not Currently   Drug use: Never   Sexual activity: Yes    Birth control/protection: None  Other Topics Concern   Not on file  Social History Narrative   Not on file   Social Determinants of Health   Financial Resource Strain: Not on file  Food Insecurity:  No Food Insecurity (12/02/2021)   Hunger Vital Sign    Worried About Running Out of Food in the Last Year: Never true    Ran Out of Food in the Last Year: Never true  Transportation Needs: No Transportation Needs (12/02/2021)   PRAPARE - Administrator, Civil Service (Medical): No    Lack of Transportation (Non-Medical): No  Physical Activity: Not on file  Stress: Not on file  Social Connections: Not on file  Intimate Partner Violence: Not on file             Mardella Layman, MD 10/06/23 1053

## 2023-10-06 NOTE — ED Triage Notes (Signed)
Pt states that she has sore throat, congestion, cough X 6 days. She is using cough drops, theraflu.

## 2024-08-12 ENCOUNTER — Encounter (HOSPITAL_COMMUNITY): Payer: Self-pay | Admitting: *Deleted

## 2024-08-12 ENCOUNTER — Ambulatory Visit (HOSPITAL_COMMUNITY): Admission: EM | Admit: 2024-08-12 | Discharge: 2024-08-12 | Disposition: A | Discharge status: Home / Self Care

## 2024-08-12 DIAGNOSIS — S025XXA Fracture of tooth (traumatic), initial encounter for closed fracture: Secondary | ICD-10-CM | POA: Diagnosis not present

## 2024-08-12 DIAGNOSIS — K0889 Other specified disorders of teeth and supporting structures: Secondary | ICD-10-CM

## 2024-08-12 MED ORDER — AMOXICILLIN 500 MG PO CAPS: 500.0000 mg | ORAL_CAPSULE | Freq: Two times a day (BID) | ORAL | 0 refills | Status: AC

## 2024-08-12 NOTE — ED Provider Notes (Signed)
 [facility]    CSN: [id number] Arrival date & time: 08/12/24  1011      History   Chief Complaint Chief Complaint  Patient presents with   Dental Pain    HPI Marilyn Casey is a 33 y.o. female.   Patient presents today due to 2 days worth of dental pain of left upper quadrant of mouth.  Patient states that she fractured her tooth a while ago but just started having pain 2 days ago.  Patient denies use of anything for her pain.  Patient states that she goes [facility] but has not been able to get an appointment.  Patient states her pain is an 8/10.   Dental Pain   Past Medical History:  Diagnosis Date   Anemia    Gestational diabetes    Preeclampsia     Patient Active Problem List   Diagnosis Date Noted   Postoperative anemia due to acute blood loss 12/11/2021   Status post repeat low transverse cesarean section 12/10/2021   Severe preeclampsia, delivered 12/10/2021   Rh negative state in antepartum period 12/10/2021   Gestational diabetes 10/30/2021    Past Surgical History:  Procedure Laterality Date   CESAREAN SECTION     CESAREAN SECTION N/A 12/10/2021   Procedure: CESAREAN SECTION;  Surgeon: Barbra Lang PARAS, DO;  Location: [facility];  Service: Obstetrics;  Laterality: N/A;   ECTOPIC PREGNANCY SURGERY      OB History     Gravida  6   Para  3   Term  0   Preterm  3   AB  2   Living  3      SAB  1   IAB      Ectopic  1   Multiple  0   Live Births  3            Home Medications    Prior to Admission medications   Medication Sig Start Date End Date Taking? Authorizing Provider  amoxicillin  (AMOXIL ) 500 MG capsule Take 1 capsule (500 mg total) by mouth 2 (two) times daily for 10 days. 08/12/24 08/22/24 Yes Andra Corean BROCKS, PA-C  chlorhexidine  (PERIDEX ) 0.12 % solution Use as directed 15 mLs in the mouth or throat in the morning, at noon, and at bedtime. 07/27/23   Jha, Panav, MD  famotidine  (PEPCID ) 20 MG  tablet Take 1 tablet (20 mg total) by mouth daily. 03/25/23   Rancour, Garnette, MD  Multiple Vitamins-Minerals (MULTIVITAMIN WITH MINERALS) tablet Take 1 tablet by mouth daily.    [provider]  promethazine -dextromethorphan (PROMETHAZINE -DM) 6.25-15 MG/5ML syrup Take 5 mLs by mouth 4 (four) times daily as needed for cough. 10/06/23   Rolinda Rogue, MD  iron  polysaccharides (NIFEREX) 150 MG capsule Take 1 capsule (150 mg total) by mouth daily. 10/20/21 12/12/21  Vannie Cornell SAUNDERS, CNM    Family History Family History  Problem Relation Age of Onset   Diabetes Mother     Social History Social History   Tobacco Use   Smoking status: Former    Current packs/day: 0.00    Types: Cigarettes    Quit date: 06/10/2021    Years since quitting: 3.1   Smokeless tobacco: Never  Vaping Use   Vaping status: Never Used  Substance Use Topics   Alcohol use: Not Currently   Drug use: Never     Allergies   Patient has no known allergies.   Review of Systems Review of  Systems   Physical Exam Triage Vital Signs ED Triage Vitals  Encounter Vitals Group     BP 08/12/24 1038 109/69     Girls Systolic BP Percentile --      Girls Diastolic BP Percentile --      Boys Systolic BP Percentile --      Boys Diastolic BP Percentile --      Pulse Rate 08/12/24 1038 82     Resp 08/12/24 1038 16     Temp 08/12/24 1038 98.2 F (36.8 C)     Temp Source 08/12/24 1038 Oral     SpO2 08/12/24 1038 97 %     Weight --      Height --      Head Circumference --      Peak Flow --      Pain Score 08/12/24 1037 8     Pain Loc --      Pain Education --      Exclude from Growth Chart --    No data found.  Updated Vital Signs BP 109/69 (BP Location: Right Arm)   Pulse 82   Temp 98.2 F (36.8 C) (Oral)   Resp 16   LMP 08/01/2024 (Approximate)   SpO2 97%   Visual Acuity Right Eye Distance:   Left Eye Distance:   Bilateral Distance:    Right Eye Near:   Left Eye Near:    Bilateral  Near:     Physical Exam Vitals and nursing note reviewed.  Constitutional:      General: She is not in acute distress.    Appearance: Normal appearance. She is not ill-appearing, toxic-appearing or diaphoretic.  HENT:     Mouth/Throat:      Comments: Fractured tooth noted, tenderness to palpation of gums Eyes:     General: No scleral icterus. Cardiovascular:     Rate and Rhythm: Normal rate and regular rhythm.     Heart sounds: Normal heart sounds.  Pulmonary:     Effort: Pulmonary effort is normal. No respiratory distress.     Breath sounds: Normal breath sounds. No wheezing or rhonchi.  Skin:    General: Skin is warm.  Neurological:     Mental Status: She is alert and oriented to person, place, and time.  Psychiatric:        Mood and Affect: Mood normal.        Behavior: Behavior normal.      UC Treatments / Results  Labs (all labs ordered are listed, but only abnormal results are displayed) Labs Reviewed - No data to display  EKG   Radiology No results found.  Procedures Procedures (including critical care time)  Medications Ordered in UC Medications - No data to display  Initial Impression / Assessment and Plan / UC Course  I have reviewed the triage vital signs and the nursing notes.  Pertinent labs & imaging results that were available during my care of the patient were reviewed by me and considered in my medical decision making (see chart for details).     Dental pain/fractured tooth-treated with amoxicillin  out of abundance of caution to cover for dental infection, advised use of ibuprofen  and Tylenol  together to help with pain, advised for patient to follow-up with dentist symptoms possible. Final Clinical Impressions(s) / UC Diagnoses   Final diagnoses:  Pain, dental  Closed fracture of tooth, initial encounter     Discharge Instructions      May use 400 mg of ibuprofen  with 1000  mg of Tylenol  every 8 hours as needed for pain.  Please make  an appointment to follow-up with your dentist as soon as possible.    ED Prescriptions     Medication Sig Dispense Auth. Provider   amoxicillin  (AMOXIL ) 500 MG capsule Take 1 capsule (500 mg total) by mouth 2 (two) times daily for 10 days. 20 capsule Andra Corean BROCKS, PA-C      PDMP not reviewed this encounter.   Andra Corean BROCKS, PA-C 08/12/24 1055

## 2024-08-12 NOTE — ED Triage Notes (Signed)
 Pt states she has upper left sided dental pain X 2 days. She has been suing Orajel without relief. She also needs a work note.

## 2024-08-12 NOTE — Discharge Instructions (Addendum)
 May use 400 mg of ibuprofen  with 1000 mg of Tylenol  every 8 hours as needed for pain.  Please make an appointment to follow-up with your dentist as soon as possible.

## 2024-09-18 ENCOUNTER — Ambulatory Visit (HOSPITAL_COMMUNITY): Admission: EM | Admit: 2024-09-18 | Discharge: 2024-09-18 | Disposition: A | Discharge status: Home / Self Care

## 2024-09-18 ENCOUNTER — Encounter (HOSPITAL_COMMUNITY): Payer: Self-pay

## 2024-09-18 DIAGNOSIS — B349 Viral infection, unspecified: Secondary | ICD-10-CM | POA: Diagnosis not present

## 2024-09-18 LAB — POC COVID19/FLU A&B COMBO
Covid Antigen, POC: NEGATIVE
Influenza A Antigen, POC: NEGATIVE
Influenza B Antigen, POC: NEGATIVE

## 2024-09-18 LAB — POCT RAPID STREP A (OFFICE): Rapid Strep A Screen: NEGATIVE

## 2024-09-18 MED ORDER — ACETAMINOPHEN 325 MG PO TABS
650.0000 mg | ORAL_TABLET | Freq: Once | ORAL | Status: AC
  Administered 2024-09-18: 650 mg via ORAL

## 2024-09-18 MED ORDER — IBUPROFEN 800 MG PO TABS
800.0000 mg | ORAL_TABLET | Freq: Three times a day (TID) | ORAL | 0 refills | Status: AC | PRN
Start: 2024-09-18 — End: ?

## 2024-09-18 MED ORDER — PROMETHAZINE-DM 6.25-15 MG/5ML PO SYRP
10.0000 mL | ORAL_SOLUTION | Freq: Three times a day (TID) | ORAL | 0 refills | Status: AC | PRN
Start: 2024-09-18 — End: ?

## 2024-09-18 MED ORDER — ACETAMINOPHEN 325 MG PO TABS
ORAL_TABLET | ORAL | Status: AC
  Filled 2024-09-18: qty 2

## 2024-09-18 MED ORDER — AZELASTINE HCL 0.1 % NA SOLN
1.0000 | Freq: Two times a day (BID) | NASAL | 1 refills | Status: AC
Start: 2024-09-18 — End: ?

## 2024-09-18 NOTE — ED Triage Notes (Signed)
 Patient here today with c/o fever, chills, cough, chest soreness, and nasal congestion X 3 days. Her kids are sick as well. Patient has taken Tylenol  with some relief. Last dose of Tylenol  was yesterday.

## 2024-09-18 NOTE — Discharge Instructions (Addendum)
  1. Viral syndrome (Primary) - POC Covid19/Flu A&B Antigen complete in UC is negative for COVID and influenza - acetaminophen  (TYLENOL ) tablet 650 mg given in UC for acute fever - POC rapid strep A in UC is negative for strep pharyngitis - azelastine (ASTELIN) 0.1 % nasal spray; Place 1 spray into both nostrils 2 (two) times daily. Use in each nostril as directed  Dispense: 30 mL; Refill: 1 - promethazine -dextromethorphan (PROMETHAZINE -DM) 6.25-15 MG/5ML syrup; Take 10 mLs by mouth 3 (three) times daily as needed for cough.  Dispense: 240 mL; Refill: 0 - ibuprofen  (ADVIL ) 800 MG tablet; Take 1 tablet (800 mg total) by mouth every 8 (eight) hours as needed for moderate pain (pain score 4-6), fever or headache.  Dispense: 30 tablet; Refill: 0 -Continue to monitor symptoms for any change in severity if there is any escalation of current symptoms or development of new symptoms follow-up in ER for further evaluation and management.

## 2024-09-18 NOTE — ED Provider Notes (Signed)
 [facility]  Note:  This document was prepared using Conservation officer, historic buildings and may include unintentional dictation errors.  MRN: [id number] DOB: [birth year]  Subjective:   Marilyn Casey is a 33 y.o. female presenting for fever, chills, chest congestion, last nasal congestion, sore throat x 3 to 4 days.  Patient reports that her children are sick at home with similar symptoms.  Patient has been using Tylenol  with minimal relief.  Patient reports that last dose of Tylenol  was yesterday.  Patient denies any shortness of breath, chest pain, weakness, dizziness.  No current facility-administered medications for this encounter.  Current Outpatient Medications:    azelastine (ASTELIN) 0.1 % nasal spray, Place 1 spray into both nostrils 2 (two) times daily. Use in each nostril as directed, Disp: 30 mL, Rfl: 1   ferrous sulfate  325 (65 FE) MG tablet, Take 325 mg by mouth daily with breakfast., Disp: , Rfl:    ibuprofen  (ADVIL ) 800 MG tablet, Take 1 tablet (800 mg total) by mouth every 8 (eight) hours as needed for moderate pain (pain score 4-6), fever or headache., Disp: 30 tablet, Rfl: 0   promethazine -dextromethorphan (PROMETHAZINE -DM) 6.25-15 MG/5ML syrup, Take 10 mLs by mouth 3 (three) times daily as needed for cough., Disp: 240 mL, Rfl: 0   No Known Allergies  Past Medical History:  Diagnosis Date   Anemia    Gestational diabetes    Preeclampsia      Past Surgical History:  Procedure Laterality Date   CESAREAN SECTION     CESAREAN SECTION N/A 12/10/2021   Procedure: CESAREAN SECTION;  Surgeon: Barbra Lang PARAS, DO;  Location: [facility];  Service: Obstetrics;  Laterality: N/A;   ECTOPIC PREGNANCY SURGERY      Family History  Problem Relation Age of Onset   Diabetes Mother     Social History   Tobacco Use   Smoking status: Some Days    Current packs/day: 0.00    Types: Cigarettes    Last attempt to quit: 06/10/2021    Years since quitting: 3.2    Smokeless tobacco: Never  Vaping Use   Vaping status: Never Used  Substance Use Topics   Alcohol use: Not Currently   Drug use: Never    ROS Refer to HPI for ROS details.  Objective:    Vitals: BP 101/64 (BP Location: Left Arm)   Pulse (!) 109   Temp (!) 101.4 F (38.6 C) (Oral)   Resp 16   LMP 09/05/2024 (Exact Date)   SpO2 93%   Physical Exam Vitals and nursing note reviewed.  Constitutional:      General: She is not in acute distress.    Appearance: Normal appearance. She is well-developed. She is not ill-appearing or toxic-appearing.  HENT:     Head: Normocephalic and atraumatic.     Nose: Congestion present. No rhinorrhea.     Mouth/Throat:     Mouth: Mucous membranes are moist.     Pharynx: Oropharynx is clear. Posterior oropharyngeal erythema present. No oropharyngeal exudate.  Eyes:     Extraocular Movements: Extraocular movements intact.     Conjunctiva/sclera: Conjunctivae normal.  Cardiovascular:     Rate and Rhythm: Normal rate.  Pulmonary:     Effort: Pulmonary effort is normal. No respiratory distress.     Breath sounds: No stridor. No wheezing.  Skin:    General: Skin is warm and dry.  Neurological:     General: No focal deficit present.     Mental Status:  She is alert and oriented to person, place, and time.  Psychiatric:        Mood and Affect: Mood normal.        Behavior: Behavior normal.     Procedures  Results for orders placed or performed during the hospital encounter of 09/18/24 (from the past 24 hours)  POC Covid19/Flu A&B Antigen     Status: None   Collection Time: 09/18/24  4:06 PM  Result Value Ref Range   Influenza A Antigen, POC Negative Negative   Influenza B Antigen, POC Negative Negative   Covid Antigen, POC Negative Negative  POC rapid strep A     Status: Normal   Collection Time: 09/18/24  4:26 PM  Result Value Ref Range   Rapid Strep A Screen Negative Negative    Assessment and Plan :     Discharge  Instructions       1. Viral syndrome (Primary) - POC Covid19/Flu A&B Antigen complete in UC is negative for COVID and influenza - acetaminophen  (TYLENOL ) tablet 650 mg given in UC for acute fever - POC rapid strep A in UC is negative for strep pharyngitis - azelastine (ASTELIN) 0.1 % nasal spray; Place 1 spray into both nostrils 2 (two) times daily. Use in each nostril as directed  Dispense: 30 mL; Refill: 1 - promethazine -dextromethorphan (PROMETHAZINE -DM) 6.25-15 MG/5ML syrup; Take 10 mLs by mouth 3 (three) times daily as needed for cough.  Dispense: 240 mL; Refill: 0 - ibuprofen  (ADVIL ) 800 MG tablet; Take 1 tablet (800 mg total) by mouth every 8 (eight) hours as needed for moderate pain (pain score 4-6), fever or headache.  Dispense: 30 tablet; Refill: 0 -Continue to monitor symptoms for any change in severity if there is any escalation of current symptoms or development of new symptoms follow-up in ER for further evaluation and management.      Marilyn Casey B Keefe Zawistowski   Aquanetta Schwarz, [place] B, [place] 09/18/24 1645

## 2024-09-20 ENCOUNTER — Emergency Department (HOSPITAL_COMMUNITY)

## 2024-09-20 ENCOUNTER — Emergency Department (HOSPITAL_COMMUNITY)
Admission: EM | Admit: 2024-09-20 | Discharge: 2024-09-20 | Disposition: A | Discharge status: Home / Self Care | Attending: Emergency Medicine | Admitting: Emergency Medicine

## 2024-09-20 ENCOUNTER — Other Ambulatory Visit: Payer: Self-pay

## 2024-09-20 DIAGNOSIS — J181 Lobar pneumonia, unspecified organism: Secondary | ICD-10-CM | POA: Insufficient documentation

## 2024-09-20 DIAGNOSIS — R509 Fever, unspecified: Secondary | ICD-10-CM | POA: Diagnosis present

## 2024-09-20 DIAGNOSIS — R051 Acute cough: Secondary | ICD-10-CM | POA: Insufficient documentation

## 2024-09-20 DIAGNOSIS — Z720 Tobacco use: Secondary | ICD-10-CM | POA: Insufficient documentation

## 2024-09-20 DIAGNOSIS — J189 Pneumonia, unspecified organism: Secondary | ICD-10-CM

## 2024-09-20 MED ORDER — ACETAMINOPHEN 325 MG PO TABS
650.0000 mg | ORAL_TABLET | Freq: Once | ORAL | Status: AC
  Administered 2024-09-20: 650 mg via ORAL
  Filled 2024-09-20: qty 2

## 2024-09-20 MED ORDER — DOXYCYCLINE HYCLATE 100 MG PO CAPS: 100.0000 mg | ORAL_CAPSULE | Freq: Two times a day (BID) | ORAL | 0 refills | Status: DC

## 2024-09-20 NOTE — ED Triage Notes (Signed)
 PT ambulatory to triage with complaints of sore throat. Pt states that she was seen at urgent care and dx with a virus. Pt states that her strep and covid tests were negative. Pt reporting that her cough is not improving.

## 2024-09-20 NOTE — Discharge Instructions (Addendum)
 You were given a prescription for antibiotics, please take 1 tablet twice a day for the next 7 days.  You may need to have a repeat x-ray after completion of antibiotics.  Continue to take ibuprofen  Tylenol  to help keep your fever down.

## 2024-09-20 NOTE — ED Provider Notes (Signed)
 [name] Provider Note   CSN: [id number] Arrival date & time: 09/20/24  9080     Patient presents with: Cough and Sore Throat   Marilyn Casey is a 33 y.o. female.   33 year old female with past medical history presents to the ED with a chief complaint of cough, sore throat which has been ongoing for the past 6 days.  Patient was evaluated urgent care a couple days ago, given medication to help with her cough reports no improvement in her symptoms.  Where she previously had COVID, this feels somewhat worse.  Reports she is constantly having a dry nonproductive cough but she is hurting her ribs now.  She did wake up with a fever of 101, came into the emergency department for further evaluation.  Denies any shortness of breath, chest pain.  She does endorse tobacco use occasionally.  The history is provided by the patient.  Cough Associated symptoms: chills, fever and sore throat   Associated symptoms: no chest pain and no shortness of breath   Sore Throat Pertinent negatives include no chest pain, no abdominal pain and no shortness of breath.       Prior to Admission medications   Medication Sig Start Date End Date Taking? Authorizing Provider  doxycycline (VIBRAMYCIN) 100 MG capsule Take 1 capsule (100 mg total) by mouth 2 (two) times daily for 7 days. 09/20/24 09/27/24 Yes Masayo Fera, PA-C  azelastine (ASTELIN) 0.1 % nasal spray Place 1 spray into both nostrils 2 (two) times daily. Use in each nostril as directed 09/18/24   Reddick, Johnathan B, NP  ferrous sulfate  325 (65 FE) MG tablet Take 325 mg by mouth daily with breakfast. 12/12/21   [provider]  ibuprofen  (ADVIL ) 800 MG tablet Take 1 tablet (800 mg total) by mouth every 8 (eight) hours as needed for moderate pain (pain score 4-6), fever or headache. 09/18/24   Reddick, Johnathan B, NP  promethazine -dextromethorphan (PROMETHAZINE -DM) 6.25-15 MG/5ML syrup Take 10 mLs by  mouth 3 (three) times daily as needed for cough. 09/18/24   Reddick, Johnathan B, NP  iron  polysaccharides (NIFEREX) 150 MG capsule Take 1 capsule (150 mg total) by mouth daily. 10/20/21 12/12/21  Walker, Jamilla R, CNM    Allergies: Patient has no known allergies.    Review of Systems  Constitutional:  Positive for chills and fever.  HENT:  Positive for sore throat. Negative for sinus pressure.   Respiratory:  Positive for cough. Negative for shortness of breath.   Cardiovascular:  Negative for chest pain.  Gastrointestinal:  Negative for abdominal pain, diarrhea and vomiting.  Genitourinary:  Negative for flank pain.  Musculoskeletal:  Negative for back pain.  All other systems reviewed and are negative.   Updated Vital Signs BP 121/69 (BP Location: Left Arm)   Pulse (!) 102   Temp 99 F (37.2 C) (Oral)   Resp 16   LMP 09/05/2024 (Exact Date)   SpO2 99%   Physical Exam Vitals and nursing note reviewed.  Constitutional:      Appearance: She is well-developed.  HENT:     Head: Normocephalic and atraumatic.     Nose: Congestion present. No rhinorrhea.     Mouth/Throat:     Mouth: Mucous membranes are moist.     Pharynx: Oropharynx is clear. Uvula midline. No oropharyngeal exudate.     Tonsils: No tonsillar exudate or tonsillar abscesses.  Cardiovascular:     Rate and Rhythm: Normal rate.  Pulmonary:  Effort: Pulmonary effort is normal.     Breath sounds: No stridor. No wheezing or rales.     Comments: No wheezing, or rales.  Chest:     Chest wall: No tenderness.  Skin:    General: Skin is warm and dry.  Neurological:     Mental Status: She is alert and oriented to person, place, and time.     (all labs ordered are listed, but only abnormal results are displayed) Labs Reviewed - No data to display  EKG: None  Radiology: DG Chest 2 View Result Date: 09/20/2024 CLINICAL DATA:  Cough, congestion and fever 1 week. EXAM: CHEST - 2 VIEW COMPARISON:  03/24/2023  FINDINGS: Mild disc opacification over the superior segment left lower lobe suggesting pneumonia. No effusion. Right lung is clear. Cardiomediastinal silhouette and remainder of the exam is unchanged. IMPRESSION: Mild airspace process over the superior segment left lower lobe suggesting pneumonia. Electronically Signed   By: Toribio Agreste M.D.   On: 09/20/2024 10:26     Procedures   Medications Ordered in the ED  acetaminophen  (TYLENOL ) tablet 650 mg (650 mg Oral Given 09/20/24 1024)                                    Medical Decision Making Amount and/or Complexity of Data Reviewed Radiology: ordered.  Risk OTC drugs.   Presenting to the ED with a chief complaint of upper respiratory symptoms such as cough, sore throat which have been ongoing for almost a week, reports all her children are currently sick and they all attend school.  Evaluated urgent care, given medication to help with cough without much improvement.  Arrived to the ED with a low-grade temperature of 99, no hypoxia heart rate slightly elevated but no tachypnea.  Lungs are clear to auscultation without any wheezing noted.  Oropharynx is clear without any edema or PTA noted or tonsillar exudates.  Soft, nontender to palpation.  X-ray obtained seeing a symptoms have lasted for over a few days, concerning for pneumonia, symptoms have been ongoing for the past week will treat with a short course of doxycycline to help with community-acquired pneumonia.  She is agreeable to plan and treatment at this time, return precautions discussed at length.  Patient stable for discharge.  Xray of the chest showed: Mild airspace process over the superior segment left lower lobe  suggesting pneumonia.      Portions of this note were generated with Scientist, clinical (histocompatibility and immunogenetics). Dictation errors may occur despite best attempts at proofreading.   Final diagnoses:  Acute cough  Pneumonia of left lower lobe due to infectious organism    ED  Discharge Orders          Ordered    doxycycline (VIBRAMYCIN) 100 MG capsule  2 times daily        09/20/24 1045               Carlise Stofer, PA-C 09/20/24 1048    Laurice Maude BROCKS, MD 09/20/24 1415

## 2024-09-21 ENCOUNTER — Other Ambulatory Visit: Payer: Self-pay

## 2024-09-21 ENCOUNTER — Emergency Department (HOSPITAL_COMMUNITY)
Admission: EM | Admit: 2024-09-21 | Discharge: 2024-09-22 | Disposition: A | Discharge status: Home / Self Care | Attending: Emergency Medicine | Admitting: Emergency Medicine

## 2024-09-21 DIAGNOSIS — E876 Hypokalemia: Secondary | ICD-10-CM | POA: Insufficient documentation

## 2024-09-21 DIAGNOSIS — R109 Unspecified abdominal pain: Secondary | ICD-10-CM | POA: Insufficient documentation

## 2024-09-21 DIAGNOSIS — J181 Lobar pneumonia, unspecified organism: Secondary | ICD-10-CM | POA: Insufficient documentation

## 2024-09-21 DIAGNOSIS — D649 Anemia, unspecified: Secondary | ICD-10-CM | POA: Diagnosis not present

## 2024-09-21 DIAGNOSIS — R112 Nausea with vomiting, unspecified: Secondary | ICD-10-CM | POA: Diagnosis present

## 2024-09-21 DIAGNOSIS — J189 Pneumonia, unspecified organism: Secondary | ICD-10-CM

## 2024-09-21 MED ORDER — SODIUM CHLORIDE 0.9 % IV BOLUS
1000.0000 mL | Freq: Once | INTRAVENOUS | Status: AC
  Administered 2024-09-21: 1000 mL via INTRAVENOUS

## 2024-09-21 MED ORDER — SODIUM CHLORIDE 0.9 % IV SOLN
2.0000 g | Freq: Once | INTRAVENOUS | Status: AC
  Administered 2024-09-21: 2 g via INTRAVENOUS
  Filled 2024-09-21: qty 20

## 2024-09-21 MED ORDER — ONDANSETRON HCL 4 MG/2ML IJ SOLN
4.0000 mg | Freq: Once | INTRAMUSCULAR | Status: AC
  Administered 2024-09-21: 4 mg via INTRAVENOUS
  Filled 2024-09-21: qty 2

## 2024-09-21 MED ORDER — ACETAMINOPHEN 325 MG PO TABS
650.0000 mg | ORAL_TABLET | Freq: Once | ORAL | Status: AC | PRN
  Administered 2024-09-21: 650 mg via ORAL
  Filled 2024-09-21: qty 2

## 2024-09-21 NOTE — ED Provider Notes (Signed)
 [place] [facility] AT [facility] Provider Note   CSN: [id number] Arrival date & time: 09/21/24  2035     Patient presents with: Emesis and Abdominal Pain   Marilyn Casey is a 33 y.o. female.   The history is provided by the patient.  Emesis Associated symptoms: abdominal pain   Abdominal Pain Associated symptoms: vomiting    She was diagnosed with pneumonia yesterday, given a prescription for doxycycline.  Today after she took a dose of doxycycline, she developed abdominal pain and vomiting.  The abdominal pain is to the left of the umbilicus.  She has vomited twice and continues to have nausea.  She denies any diarrhea.  She has had fevers as high as 100 but denies chills or sweats.    Prior to Admission medications   Medication Sig Start Date End Date Taking? Authorizing Provider  azelastine (ASTELIN) 0.1 % nasal spray Place 1 spray into both nostrils 2 (two) times daily. Use in each nostril as directed 09/18/24   Reddick, Johnathan B, NP  doxycycline (VIBRAMYCIN) 100 MG capsule Take 1 capsule (100 mg total) by mouth 2 (two) times daily for 7 days. 09/20/24 09/27/24  Soto, Johana, PA-C  ferrous sulfate  325 (65 FE) MG tablet Take 325 mg by mouth daily with breakfast. 12/12/21   [provider]  ibuprofen  (ADVIL ) 800 MG tablet Take 1 tablet (800 mg total) by mouth every 8 (eight) hours as needed for moderate pain (pain score 4-6), fever or headache. 09/18/24   Reddick, Johnathan B, NP  promethazine -dextromethorphan (PROMETHAZINE -DM) 6.25-15 MG/5ML syrup Take 10 mLs by mouth 3 (three) times daily as needed for cough. 09/18/24   Reddick, Johnathan B, NP  iron  polysaccharides (NIFEREX) 150 MG capsule Take 1 capsule (150 mg total) by mouth daily. 10/20/21 12/12/21  Walker, Jamilla R, CNM    Allergies: Patient has no known allergies.    Review of Systems  Gastrointestinal:  Positive for abdominal pain and vomiting.  All other systems reviewed and are  negative.   Updated Vital Signs BP 116/76   Pulse (!) 109   Temp 99.8 F (37.7 C)   Resp 16   Ht 5' 4 (1.626 m)   Wt 81.6 kg   LMP 09/05/2024 (Exact Date)   SpO2 98%   BMI 30.88 kg/m   Physical Exam Vitals and nursing note reviewed.   33 year old female, resting comfortably and in no acute distress. Vital signs are significant for elevated heart rate. Oxygen saturation is 98%, which is normal. Head is normocephalic and atraumatic. PERRLA, EOMI.  Lungs are clear without rales, wheezes, or rhonchi. Heart has regular rate and rhythm without murmur. Abdomen is soft, flat, with mild tenderness in the left mid abdomen.  There is no rebound or guarding. Skin is warm and dry without rash. Neurologic: Mental status is normal, cranial nerves are intact, moves all extremities equally.  (all labs ordered are listed, but only abnormal results are displayed) Labs Reviewed  COMPREHENSIVE METABOLIC PANEL WITH GFR - Abnormal; Notable for the following components:      Result Value   Potassium 3.2 (*)    Calcium 8.6 (*)    All other components within normal limits  CBC WITH DIFFERENTIAL/PLATELET - Abnormal; Notable for the following components:   RBC 3.86 (*)    Hemoglobin 11.6 (*)    HCT 34.0 (*)    All other components within normal limits  HCG, SERUM, QUALITATIVE    EKG: None  Radiology: DG  Chest 2 View Result Date: 09/20/2024 CLINICAL DATA:  Cough, congestion and fever 1 week. EXAM: CHEST - 2 VIEW COMPARISON:  03/24/2023 FINDINGS: Mild disc opacification over the superior segment left lower lobe suggesting pneumonia. No effusion. Right lung is clear. Cardiomediastinal silhouette and remainder of the exam is unchanged. IMPRESSION: Mild airspace process over the superior segment left lower lobe suggesting pneumonia. Electronically Signed   By: Toribio Agreste M.D.   On: 09/20/2024 10:26     Procedures   Medications Ordered in the ED  potassium chloride SA (KLOR-CON M) CR tablet  40 mEq (has no administration in time range)  acetaminophen  (TYLENOL ) tablet 650 mg (650 mg Oral Given 09/21/24 2050)  sodium chloride  0.9 % bolus 1,000 mL (1,000 mLs Intravenous New Bag/Given 09/21/24 2337)  ondansetron  (ZOFRAN ) injection 4 mg (4 mg Intravenous Given 09/21/24 2337)  cefTRIAXone (ROCEPHIN) 2 g in sodium chloride  0.9 % 100 mL IVPB (0 g Intravenous Stopped 09/22/24 0402)  prochlorperazine (COMPAZINE) injection 10 mg (10 mg Intravenous Given 09/22/24 0120)                                    Medical Decision Making Amount and/or Complexity of Data Reviewed Labs: ordered.  Risk OTC drugs. Prescription drug management.   Abdominal pain and vomiting after taking doxycycline.  Exam is benign, suspect gastritis secondary to doxycycline.  I have reviewed her past records, and note ED visit on 09/20/2024 for cough with x-ray showing left lower lobe pneumonia.  I have ordered screening labs, IV fluids, ondansetron  for nausea.  Since she failed to hold down her doxycycline, I have ordered a dose of ceftriaxone.  She did not feel better following ondansetron , I ordered a dose of prochlorperazine.  Following this, she is feeling better and taking oral fluids.  I am discharging her with instructions to discontinue doxycycline and I have given her a prescription for cefuroxime and also a prescription for prochlorperazine.  Follow-up with PCP in 1 week for recheck.     Final diagnoses:  Nausea and vomiting, unspecified vomiting type  Normochromic normocytic anemia  Community acquired pneumonia of left lung, unspecified part of lung  Hypokalemia due to excessive gastrointestinal loss of potassium    ED Discharge Orders          Ordered    cefUROXime (CEFTIN) 500 MG tablet  2 times daily with meals,   Status:  Discontinued        09/22/24 0417    prochlorperazine (COMPAZINE) 10 MG tablet  Every 6 hours PRN,   Status:  Discontinued        09/22/24 0417    cefUROXime (CEFTIN) 500  MG tablet  2 times daily with meals        09/22/24 0419    prochlorperazine (COMPAZINE) 10 MG tablet  Every 6 hours PRN        09/22/24 0419               Raford Lenis, MD 09/22/24 [phone]

## 2024-09-21 NOTE — ED Triage Notes (Signed)
 Pt here with c/o not being able to keep anything down even after taking meds prescribed during first visit to ED today. Pt Dx with pneumonia. Pt also c/o abd pain from throwing up. Denies diarrhea

## 2024-09-22 LAB — CBC WITH DIFFERENTIAL/PLATELET
Abs Immature Granulocytes: 0.01 K/uL (ref 0.00–0.07)
Basophils Absolute: 0 K/uL (ref 0.0–0.1)
Basophils Relative: 1 %
Eosinophils Absolute: 0 K/uL (ref 0.0–0.5)
Eosinophils Relative: 0 %
HCT: 34 % — ABNORMAL LOW (ref 36.0–46.0)
Hemoglobin: 11.6 g/dL — ABNORMAL LOW (ref 12.0–15.0)
Immature Granulocytes: 0 %
Lymphocytes Relative: 24 %
Lymphs Abs: 1.1 K/uL (ref 0.7–4.0)
MCH: 30.1 pg (ref 26.0–34.0)
MCHC: 34.1 g/dL (ref 30.0–36.0)
MCV: 88.1 fL (ref 80.0–100.0)
Monocytes Absolute: 0.2 K/uL (ref 0.1–1.0)
Monocytes Relative: 5 %
Neutro Abs: 3.3 K/uL (ref 1.7–7.7)
Neutrophils Relative %: 70 %
Platelets: 359 K/uL (ref 150–400)
RBC: 3.86 MIL/uL — ABNORMAL LOW (ref 3.87–5.11)
RDW: 13.4 % (ref 11.5–15.5)
WBC: 4.7 K/uL (ref 4.0–10.5)
nRBC: 0 % (ref 0.0–0.2)

## 2024-09-22 LAB — COMPREHENSIVE METABOLIC PANEL WITH GFR
ALT: 14 U/L (ref 0–44)
AST: 29 U/L (ref 15–41)
Albumin: 3.7 g/dL (ref 3.5–5.0)
Alkaline Phosphatase: 79 U/L (ref 38–126)
Anion gap: 13 (ref 5–15)
BUN: 6 mg/dL (ref 6–20)
CO2: 23 mmol/L (ref 22–32)
Calcium: 8.6 mg/dL — ABNORMAL LOW (ref 8.9–10.3)
Chloride: 101 mmol/L (ref 98–111)
Creatinine, Ser: 0.84 mg/dL (ref 0.44–1.00)
GFR, Estimated: >60 mL/min (ref >60)
Glucose, Bld: 94 mg/dL (ref 70–99)
Potassium: 3.2 mmol/L — ABNORMAL LOW (ref 3.5–5.1)
Sodium: 137 mmol/L (ref 135–145)
Total Bilirubin: 0.3 mg/dL (ref 0.0–1.2)
Total Protein: 7.1 g/dL (ref 6.5–8.1)

## 2024-09-22 LAB — HCG, SERUM, QUALITATIVE: Preg, Serum: NEGATIVE

## 2024-09-22 MED ORDER — POTASSIUM CHLORIDE CRYS ER 20 MEQ PO TBCR
40.0000 meq | EXTENDED_RELEASE_TABLET | Freq: Once | ORAL | Status: AC
  Administered 2024-09-22: 40 meq via ORAL
  Filled 2024-09-22: qty 2

## 2024-09-22 MED ORDER — CEFUROXIME AXETIL 500 MG PO TABS: 500.0000 mg | ORAL_TABLET | Freq: Two times a day (BID) | ORAL | 0 refills | Status: AC

## 2024-09-22 MED ORDER — PROCHLORPERAZINE MALEATE 10 MG PO TABS: 10.0000 mg | ORAL_TABLET | Freq: Four times a day (QID) | ORAL | 0 refills | Status: DC | PRN

## 2024-09-22 MED ORDER — PROCHLORPERAZINE EDISYLATE 10 MG/2ML IJ SOLN
10.0000 mg | Freq: Once | INTRAMUSCULAR | Status: AC
  Administered 2024-09-22: 10 mg via INTRAVENOUS
  Filled 2024-09-22: qty 2

## 2024-09-22 MED ORDER — PROCHLORPERAZINE MALEATE 10 MG PO TABS: 10.0000 mg | ORAL_TABLET | Freq: Four times a day (QID) | ORAL | 0 refills | Status: AC | PRN

## 2024-09-22 MED ORDER — CEFUROXIME AXETIL 500 MG PO TABS: 500.0000 mg | ORAL_TABLET | Freq: Two times a day (BID) | ORAL | 0 refills | Status: DC

## 2024-09-22 NOTE — Discharge Instructions (Signed)
 Stop taking doxycycline.  It can be irritating to your stomach at night may have been what triggered your vomiting.  I have prescribed a different antibiotic to take its place.

## 2024-09-26 ENCOUNTER — Other Ambulatory Visit (HOSPITAL_BASED_OUTPATIENT_CLINIC_OR_DEPARTMENT_OTHER): Payer: Self-pay
